# Patient Record
Sex: Female | Born: 1960 | Race: White | Hispanic: No | Marital: Married | Smoking: Never smoker
Health system: Southern US, Community
[De-identification: ages and names within clinical notes are randomized; demographics above are authoritative.]

## PROBLEM LIST (undated history)

## (undated) DIAGNOSIS — L659 Nonscarring hair loss, unspecified: Secondary | ICD-10-CM

## (undated) DIAGNOSIS — I10 Essential (primary) hypertension: Secondary | ICD-10-CM

## (undated) DIAGNOSIS — G43909 Migraine, unspecified, not intractable, without status migrainosus: Secondary | ICD-10-CM

## (undated) DIAGNOSIS — G629 Polyneuropathy, unspecified: Secondary | ICD-10-CM

## (undated) DIAGNOSIS — F329 Major depressive disorder, single episode, unspecified: Secondary | ICD-10-CM

## (undated) DIAGNOSIS — E890 Postprocedural hypothyroidism: Secondary | ICD-10-CM

## (undated) DIAGNOSIS — N83201 Unspecified ovarian cyst, right side: Secondary | ICD-10-CM

## (undated) DIAGNOSIS — F411 Generalized anxiety disorder: Secondary | ICD-10-CM

## (undated) DIAGNOSIS — G4700 Insomnia, unspecified: Secondary | ICD-10-CM

## (undated) DIAGNOSIS — F419 Anxiety disorder, unspecified: Secondary | ICD-10-CM

## (undated) DIAGNOSIS — D519 Vitamin B12 deficiency anemia, unspecified: Secondary | ICD-10-CM

## (undated) DIAGNOSIS — F32A Depression, unspecified: Secondary | ICD-10-CM

## (undated) DIAGNOSIS — H8110 Benign paroxysmal vertigo, unspecified ear: Secondary | ICD-10-CM

## (undated) HISTORY — DX: Anxiety disorder, unspecified: F41.9

## (undated) HISTORY — DX: Insomnia, unspecified: G47.00

## (undated) HISTORY — DX: Migraine, unspecified, not intractable, without status migrainosus: G43.909

## (undated) HISTORY — DX: Major depressive disorder, single episode, unspecified: F32.9

## (undated) HISTORY — DX: Postprocedural hypothyroidism: E89.0

## (undated) HISTORY — PX: EYE SURGERY: SHX253

## (undated) HISTORY — DX: Depression, unspecified: F32.A

## (undated) HISTORY — DX: Nonscarring hair loss, unspecified: L65.9

---

## 1997-11-08 ENCOUNTER — Encounter: Admission: RE | Admit: 1997-11-08 | Discharge: 1997-11-08 | Payer: Self-pay | Admitting: *Deleted

## 1999-01-23 ENCOUNTER — Encounter: Payer: Self-pay | Admitting: Physical Medicine and Rehabilitation

## 1999-01-23 ENCOUNTER — Ambulatory Visit (HOSPITAL_COMMUNITY)
Admission: RE | Admit: 1999-01-23 | Discharge: 1999-01-23 | Payer: Self-pay | Admitting: Physical Medicine and Rehabilitation

## 2000-03-09 ENCOUNTER — Encounter: Payer: Self-pay | Admitting: Emergency Medicine

## 2000-03-09 ENCOUNTER — Emergency Department (HOSPITAL_COMMUNITY): Admission: EM | Admit: 2000-03-09 | Discharge: 2000-03-09 | Payer: Self-pay | Admitting: Emergency Medicine

## 2004-10-17 ENCOUNTER — Encounter: Admission: RE | Admit: 2004-10-17 | Discharge: 2004-10-17 | Payer: Self-pay | Admitting: Family Medicine

## 2005-04-09 DIAGNOSIS — E890 Postprocedural hypothyroidism: Secondary | ICD-10-CM

## 2005-04-09 HISTORY — DX: Postprocedural hypothyroidism: E89.0

## 2005-12-26 ENCOUNTER — Ambulatory Visit (HOSPITAL_COMMUNITY): Admission: RE | Admit: 2005-12-26 | Discharge: 2005-12-26 | Payer: Self-pay | Admitting: Family Medicine

## 2006-03-06 ENCOUNTER — Encounter: Admission: RE | Admit: 2006-03-06 | Discharge: 2006-03-06 | Payer: Self-pay | Admitting: Endocrinology

## 2006-03-09 DIAGNOSIS — Z8639 Personal history of other endocrine, nutritional and metabolic disease: Secondary | ICD-10-CM

## 2006-03-09 HISTORY — DX: Personal history of other endocrine, nutritional and metabolic disease: Z86.39

## 2006-03-29 ENCOUNTER — Encounter: Admission: RE | Admit: 2006-03-29 | Discharge: 2006-03-29 | Payer: Self-pay | Admitting: Endocrinology

## 2007-09-04 ENCOUNTER — Encounter: Admission: RE | Admit: 2007-09-04 | Discharge: 2007-09-04 | Payer: Self-pay | Admitting: Family Medicine

## 2007-09-05 ENCOUNTER — Encounter: Admission: RE | Admit: 2007-09-05 | Discharge: 2007-09-05 | Payer: Self-pay | Admitting: Family Medicine

## 2008-01-08 ENCOUNTER — Encounter: Admission: RE | Admit: 2008-01-08 | Discharge: 2008-01-08 | Payer: Self-pay | Admitting: Family Medicine

## 2010-08-24 ENCOUNTER — Ambulatory Visit
Admission: RE | Admit: 2010-08-24 | Discharge: 2010-08-24 | Disposition: A | Discharge status: Home / Self Care | Payer: BC Managed Care – PPO | Source: Ambulatory Visit | Attending: Family Medicine | Admitting: Family Medicine

## 2010-08-24 ENCOUNTER — Other Ambulatory Visit: Payer: Self-pay | Admitting: Family Medicine

## 2010-08-24 DIAGNOSIS — R52 Pain, unspecified: Secondary | ICD-10-CM

## 2012-02-08 ENCOUNTER — Other Ambulatory Visit: Payer: Self-pay | Admitting: Physician Assistant

## 2012-02-08 DIAGNOSIS — Z1231 Encounter for screening mammogram for malignant neoplasm of breast: Secondary | ICD-10-CM

## 2012-02-11 ENCOUNTER — Ambulatory Visit: Payer: BC Managed Care – PPO

## 2014-11-02 ENCOUNTER — Telehealth: Payer: Self-pay | Admitting: Internal Medicine

## 2014-11-02 NOTE — Telephone Encounter (Signed)
Made in error

## 2014-12-14 ENCOUNTER — Encounter: Payer: Self-pay | Admitting: Internal Medicine

## 2014-12-14 ENCOUNTER — Ambulatory Visit (INDEPENDENT_AMBULATORY_CARE_PROVIDER_SITE_OTHER): Payer: BC Managed Care – PPO | Admitting: Internal Medicine

## 2014-12-14 VITALS — BP 112/76 | HR 74 | Temp 98.5°F | Resp 12 | Ht 63.0 in | Wt 124.0 lb

## 2014-12-14 DIAGNOSIS — E890 Postprocedural hypothyroidism: Secondary | ICD-10-CM

## 2014-12-14 LAB — T4, FREE: FREE T4: 0.93 ng/dL (ref 0.60–1.60)

## 2014-12-14 LAB — TSH: TSH: 2.12 u[IU]/mL (ref 0.35–4.50)

## 2014-12-14 MED ORDER — SYNTHROID 125 MCG PO TABS: 125.0000 ug | ORAL_TABLET | Freq: Every day | ORAL | Status: DC

## 2014-12-14 NOTE — Progress Notes (Signed)
Patient ID: Kristen [name], female   DOB: 1960/07/13, 54 y.o.   MRN: [id number]   HPI  Kristen [name] is a 54 y.o.-year-old female, self referred, for management of postablative hypothyroidism. PCP: [facility]  ObGyn: Dr Dellis Filbert.   Pt. has been dx with hypothyroidism in 2007 after RAI ablation in 03/2006; is on Levothyroxine 125 mcg, taken: - fasting - with water - separated by >90 min from b'fast  - no calcium, iron, PPIs - + multivitamins at lunchtime  I reviewed pt's thyroid tests - fluctuating: 10/21/2014: TSH 8.210 09/01/2014: TSH 1.590 07/26/2014: TSH 0.440 06/28/2014: TSH 0.110 06/09/2014: TSH 0.150 06/09/2014: TSH 0.300 (0.34-4.8) 04/26/2014: TSH 1.490 12/11/2013: TSH 0.290 (0.34-4.8)  Last OV with Dr Chalmers Cater was in 10/2014.  Pt describes: - + weight loss (50 lbs in last 6 mo)  - changed her diet but slightly - + fatigue - + tremors - up and down for years - + heat intolerance (hot flushes >> better on HRT) - no depression/+ anxiety >> panic attacks - no constipation - no dry skin - + hair loss  Pt denies feeling nodules in neck, hoarseness, + dysphagia/+ odynophagia, no SOB with lying down.  She has no FH of thyroid disorders. No FH of thyroid cancer.  No h/o radiation tx to head or neck, other than RAI tx for Graves ds. No recent use of iodine supplements.  ROS: Constitutional: See HPI Eyes: no blurry vision, no xerophthalmia ENT: + sore throat, no nodules palpated in throat, no dysphagia/odynophagia, no hoarseness Cardiovascular: no CP/SOB/palpitations/leg swelling Respiratory: no cough/SOB Gastrointestinal: no N/V/D/C Musculoskeletal: no muscle/joint aches Skin: no rashes, no hyperpigmentation Neurological: + tremors/no numbness/tingling/dizziness, + HA Psychiatric: no depression/+ anxiety  PMH: - h/o Graves' disease - Post ablative hypothyroidism - Anxiety /depression - Postmenopausal status   PMH: - no Sx hx   Social History   Social  History  . Marital Status: Married    Spouse Name: N/A  . Number of Children: 1   Occupational History  . N/a     Smoking status: Never Smoker   . Smokeless tobacco: No  . Alcohol Use: No  . Drug Use: No   Current Outpatient Rx  Name  Route  Sig  Dispense  Refill  . Aspirin-Salicylamide-Caffeine (BC HEADACHE POWDER PO)   Oral   Take by mouth as needed.         . clonazePAM (KLONOPIN) 1 MG tablet      TAKE 1 TABLET BY MOUTH EVERY DAY WITH MEAL. OCCASIONALLY EXTRA PILL AS NEEDED IF ANXIETY INCREASES.      1   . fexofenadine (ALLEGRA) 180 MG tablet   Oral   Take 90 mg by mouth 2 (two) times daily.         Marland Kitchen JINTELI 1-5 MG-MCG TABS   Oral   Take 1 tablet by mouth daily.      11     Dispense as written.   Marland Kitchen levothyroxine (SYNTHROID, LEVOTHROID) 125 MCG tablet   Oral   Take 125 mcg by mouth daily.      3   . MIMVEY LO 0.5-0.1 MG per tablet   Oral   Take 1 tablet by mouth daily.      9     Dispense as written.   . rizatriptan (MAXALT) 5 MG tablet   Oral   Take 5 mg by mouth as needed for migraine. May repeat in 2 hours if needed         .  sertraline (ZOLOFT) 100 MG tablet   Oral   Take 200 mg by mouth daily.      1    Allergies  Allergen Reactions  . Benadryl [Diphenhydramine Hcl] Other (See Comments)    Makes her skin crawl and she gets hyped up    Family history: - No thyroid family history  - No diabetes, hypertension, hyperlipidemia, cancer in family members   PE: BP 112/76 mmHg  Pulse 74  Temp(Src) 98.5 F (36.9 C) (Oral)  Resp 12  Ht 5\' 3"  (1.6 m)  Wt 124 lb (56.246 kg)  BMI 21.97 kg/m2  SpO2 98%  LMP  Wt Readings from Last 3 Encounters:  12/14/14 124 lb (56.246 kg)   Constitutional: normal weight, in NAD Eyes: PERRLA, EOMI, no exophthalmos ENT: moist mucous membranes, no thyromegaly, no cervical lymphadenopathy Cardiovascular: RRR, No MRG Respiratory: CTA B Gastrointestinal: abdomen soft, NT, ND, BS+ Musculoskeletal: no  deformities, strength intact in all 4 Skin: moist, warm, no rashes Neurological: + mild tremor with outstretched hands, DTR normal in all 4  ASSESSMENT: 1.  post ablative Hypothyroidism  PLAN:  1. Patient with long-standing hypothyroidism developed after RAI ablation for Graves' disease, on levothyroxine therapy.has tremors, anxiety, weight loss, dysphagia. Her most recent thyroid tests shows an elevated TSH, at 8. Her levothyroxine dose has been changed several times in the last few months. She does not appear to have a goiter, thyroid nodules, or neck compression symptoms - We discussed about correct intake of levothyroxine, fasting, with water, separated by at least 30 minutes from breakfast, and separated by more than 4 hours from calcium, iron, multivitamins, acid reflux medications (PPIs). she is taking it correctly.  - I suggested that she switches to brand name Synthroid for more consistent dosing. I gave her a discount card for this medication.  - will check thyroid tests today: TSH, free T4 - If these are abnormal, she will need to return in 6-8 weeks for repeat labs - If these are normal, I will see her back in 3 months  Needs Synthroid DAW  Office Visit on 12/14/2014  Component Date Value Ref Range Status  . TSH 12/14/2014 2.12  0.35 - 4.50 uIU/mL Final  . Free T4 12/14/2014 0.93  0.60 - 1.60 ng/dL Final   Thyroid tests are at goal. We'll go ahead and switch to Synthroid d.a.w. and repeat her thyroid tests when she comes back in 3 months.

## 2014-12-14 NOTE — Patient Instructions (Signed)
Please stop at the lab.  Please try to join MyChart for easier communication.  Take the thyroid hormone every day, with water, >30 minutes before breakfast, separated by >4 hours from acid reflux medications, calcium, iron, multivitamins.

## 2015-02-09 ENCOUNTER — Encounter: Payer: Self-pay | Admitting: *Deleted

## 2015-02-11 ENCOUNTER — Ambulatory Visit (INDEPENDENT_AMBULATORY_CARE_PROVIDER_SITE_OTHER): Payer: BC Managed Care – PPO | Admitting: Diagnostic Neuroimaging

## 2015-02-11 ENCOUNTER — Encounter: Payer: Self-pay | Admitting: Diagnostic Neuroimaging

## 2015-02-11 VITALS — BP 92/58 | HR 88 | Resp 20 | Ht 63.0 in | Wt 124.0 lb

## 2015-02-11 DIAGNOSIS — G250 Essential tremor: Secondary | ICD-10-CM | POA: Diagnosis not present

## 2015-02-11 DIAGNOSIS — G252 Other specified forms of tremor: Secondary | ICD-10-CM | POA: Insufficient documentation

## 2015-02-11 DIAGNOSIS — R251 Tremor, unspecified: Secondary | ICD-10-CM | POA: Insufficient documentation

## 2015-02-11 NOTE — Progress Notes (Signed)
[facility]  PATIENT: Kristen [name] DOB: 1960-05-15  REFERRING CLINICIAN: Matilde Haymaker HISTORY FROM: patient and husband  REASON FOR VISIT: new consult   HISTORICAL  CHIEF COMPLAINT:  Chief Complaint  Patient presents with  . New Patient (Initial Visit)    shaking in hands and arms, going for 4-5 months, rm 6, with husband keith    HISTORY OF PRESENT ILLNESS:   54 year old right-handed female here for evaluation of tremor. For past 5 months patient has had intermittent arm, body, leg tremor and shaking. This changes of fluctuates throughout the day. Symptoms seem to aggravate more in the late evening before she takes her bedtime clonazepam. When she takes this medication she feels calm and relaxed tremor stop. In the morning she also feels well. Sometimes when she is getting ready to leave her home she feels more tremor. After she leaves her home and is doing activities, her tremor calms down.  3 years ago patient went through significant traumatic event, found her son-in-law had committed suicide in their back yard (hung himself). Patient then developed significant anxiety, depression, PTSD symptoms. She's been getting psychiatry and psychology treatments since that time. Patient has been on sertraline since that time. 4 months ago her sertraline was increased from 100-200 mg daily.  Patient also has history of Graves' disease, status post thyroid ablation and post operative hypothyroidism. Apparently her thyroid function testing recently has been normal.   REVIEW OF SYSTEMS: Full 14 system review of systems performed and notable only for confusion headache weakness sleepiness restless legs tremor depression anxiety decreased energy disinterest activity suicidal thoughts allergy skin sensitivity feeling hot feeling cold 50 pound weight loss over 3 months, occurred 5 months ago, ringing in ears.   ALLERGIES: Allergies  Allergen Reactions  . Benadryl [Diphenhydramine  Hcl] Other (See Comments)    Makes her skin crawl and she gets hyped up     HOME MEDICATIONS: Outpatient Prescriptions Prior to Visit  Medication Sig Dispense Refill  . Aspirin-Salicylamide-Caffeine (BC HEADACHE POWDER PO) Take by mouth as needed.    . clonazePAM (KLONOPIN) 1 MG tablet TAKE 1 TABLET BY MOUTH EVERY DAY WITH MEAL. OCCASIONALLY EXTRA PILL AS NEEDED IF ANXIETY INCREASES.  1  . fexofenadine (ALLEGRA) 180 MG tablet Take 90 mg by mouth 2 (two) times daily.    . rizatriptan (MAXALT) 5 MG tablet Take 5 mg by mouth as needed for migraine. May repeat in 2 hours if needed    . sertraline (ZOLOFT) 100 MG tablet Take 200 mg by mouth daily.  1  . SYNTHROID 125 MCG tablet Take 1 tablet (125 mcg total) by mouth daily before breakfast. 90 tablet 1  . JINTELI 1-5 MG-MCG TABS Take 1 tablet by mouth daily.  11  . MIMVEY LO 0.5-0.1 MG per tablet Take 1 tablet by mouth daily.  9   No facility-administered medications prior to visit.    PAST MEDICAL HISTORY: Past Medical History  Diagnosis Date  . Migraine   . Insomnia   . Post-surgical hypothyroidism     ?cancer  . Hair loss   . Depression   . Anxiety     PAST SURGICAL HISTORY: No past surgical history on file.  FAMILY HISTORY: Family History  Problem Relation Age of Onset  . Hypertension Father   . Heart disease Father   . Healthy Mother     SOCIAL HISTORY:  Social History   Social History  . Marital Status: Married    Spouse Name:  N/A  . Number of Children: 1  . Years of Education: N/A   Occupational History  .      unemployed   Social History Main Topics  . Smoking status: Never Smoker   . Smokeless tobacco: Not on file  . Alcohol Use: 0.0 oz/week    0 Standard drinks or equivalent per week     Comment: rarely  . Drug Use: No  . Sexual Activity: Not on file   Other Topics Concern  . Not on file   Social History Narrative   Lives with husband.   Drinks only water. Denies caffeine use.     PHYSICAL  EXAM  GENERAL EXAM/CONSTITUTIONAL: Vitals:  Filed Vitals:   02/11/15 1011  BP: 92/58  Pulse: 88  Resp: 20  Height: 5\' 3"  (1.6 m)  Weight: 124 lb (56.246 kg)     Body mass index is 21.97 kg/(m^2).  No exam data present  Patient is in no distress; well developed, nourished and groomed; neck is supple  CARDIOVASCULAR:  Examination of carotid arteries is normal; no carotid bruits  Regular rate and rhythm, no murmurs  Examination of peripheral vascular system by observation and palpation is normal  EYES:  Ophthalmoscopic exam of optic discs and posterior segments is normal; no papilledema or hemorrhages  MUSCULOSKELETAL:  Gait, strength, tone, movements noted in Neurologic exam below  NEUROLOGIC: MENTAL STATUS:  No flowsheet data found.  awake, alert, oriented to person, place and time  recent and remote memory intact  normal attention and concentration  language fluent, comprehension intact, naming intact,   fund of knowledge appropriate  CRANIAL NERVE:   2nd - no papilledema on fundoscopic exam  2nd, 3rd, 4th, 6th - pupils equal and reactive to light, visual fields full to confrontation, extraocular muscles intact, no nystagmus  5th - facial sensation symmetric  7th - facial strength symmetric  8th - hearing intact  9th - palate elevates symmetrically, uvula midline  11th - shoulder shrug symmetric  12th - tongue protrusion midline  MOTOR:   normal bulk and tone, full strength in the BUE, BLE  SENSORY:   normal and symmetric to light touch, temperature, vibration  COORDINATION:   finger-nose-finger, fine finger movements normal  REFLEXES:   deep tendon reflexes present and symmetric  GAIT/STATION:   narrow based gait; TANDEM STABLE; romberg is negative    DIAGNOSTIC DATA (LABS, IMAGING, TESTING) - I reviewed patient records, labs, notes, testing and imaging myself where available.  No results found for: WBC, HGB, HCT, MCV,  PLT No results found for: NA, K, CL, CO2, GLUCOSE, BUN, CREATININE, CALCIUM, PROT, ALBUMIN, AST, ALT, ALKPHOS, BILITOT, GFRNONAA, GFRAA No results found for: CHOL, HDL, LDLCALC, LDLDIRECT, TRIG, CHOLHDL No results found for: HGBA1C No results found for: VITAMINB12 Lab Results  Component Value Date   TSH 2.12 12/14/2014       ASSESSMENT AND PLAN  54 y.o. year old female here with intermittent postural tremor, likely enhanced physiologic tremor and anxiety associated tremor. Neurologic exam is normal today. Tremor most likely related to underlying anxiety and PTSD. Hormone fluctuations could also play a role. I do not recommend any further neurodiagnostic testing at this time. Advised her to continue working with her doctors on anxiety/PTSD treatment, continue exercise and healthy eating habits.   Dx: Excessive physiologic tremor   PLAN: - continue current medications and exercise  Return if symptoms worsen or fail to improve, for return to PCP.    Penni Bombard, MD  26/06/3352, 56:25 AM Certified in Neurology, Neurophysiology and [facility] [address] [place], [place] [address] [phone]

## 2015-02-11 NOTE — Patient Instructions (Signed)
Thank you for coming to see [place] at [facility]. I hope we have been able to provide you high quality care today.  You may receive a patient satisfaction survey over the next few weeks. We would appreciate your feedback and comments so that we may continue to improve ourselves and the health of our patients.   - continue current medications and exercise   ~~~~~~~~~~~~~~~~~~~~~~~~~~~~~~~~~~~~~~~~~~~~~~~~~~~~~~~~~~~~~~~~~  DR. PENUMALLI'S GUIDE TO HAPPY AND HEALTHY LIVING These are some of my general health and wellness recommendations. Some of them may apply to you better than others. Please use common sense as you try these suggestions and feel free to ask me any questions.   ACTIVITY/FITNESS Mental, social, emotional and physical stimulation are very important for brain and body health. Try learning a new activity (arts, music, language, sports, games).  Keep moving your body to the best of your abilities. You can do this at home, inside or outside, the park, community center, gym or anywhere you like. Consider a physical therapist or personal trainer to get started. Consider the app Sworkit. Fitness trackers such as smart-watches, smart-phones or Fitbits can help as well.   NUTRITION Eat more plants: colorful vegetables, nuts, seeds and berries.  Eat less sugar, salt, preservatives and processed foods.  Avoid toxins such as cigarettes and alcohol.  Drink water when you are thirsty. Warm water with a slice of lemon is an excellent morning drink to start the day.  Consider these websites for more information The Nutrition Source (https://www.henry-hernandez.biz/) Precision Nutrition (WindowBlog.ch)   RELAXATION Consider practicing mindfulness meditation or other relaxation techniques such as deep breathing, prayer, yoga, tai chi, massage. See website mindful.org or the apps Headspace or Calm to help get  started.   SLEEP Try to get at least 7-8+ hours sleep per day. Regular exercise and reduced caffeine will help you sleep better. Practice good sleep hygeine techniques. See website sleep.org for more information.   PLANNING Prepare estate planning, living will, healthcare POA documents. Sometimes this is best planned with the help of an attorney. Theconversationproject.org and agingwithdignity.org are excellent resources.

## 2015-03-18 ENCOUNTER — Ambulatory Visit (INDEPENDENT_AMBULATORY_CARE_PROVIDER_SITE_OTHER): Payer: BC Managed Care – PPO | Admitting: Internal Medicine

## 2015-03-18 ENCOUNTER — Encounter: Payer: Self-pay | Admitting: Internal Medicine

## 2015-03-18 VITALS — BP 108/60 | HR 78 | Temp 98.1°F | Resp 12 | Wt 123.6 lb

## 2015-03-18 DIAGNOSIS — E890 Postprocedural hypothyroidism: Secondary | ICD-10-CM | POA: Diagnosis not present

## 2015-03-18 LAB — TSH: TSH: 0.83 u[IU]/mL (ref 0.35–4.50)

## 2015-03-18 LAB — T4, FREE: FREE T4: 1.05 ng/dL (ref 0.60–1.60)

## 2015-03-18 NOTE — Patient Instructions (Signed)
Please stop at the lab.  Continue Synthroid 125 mcg daily.  Take the thyroid hormone every day, with water, >30 minutes before breakfast, separated by >4 hours from acid reflux medications, calcium, iron, multivitamins.

## 2015-03-18 NOTE — Progress Notes (Signed)
Patient ID: Kristen [name], female   DOB: Mar 29, 1961, 54 y.o.   MRN: [id number]   HPI  Kristen [name] is a 54 y.o.-year-old female, returning for f/u for postablative hypothyroidism. Last visit 3 mo ago. PCP: [facility]  ObGyn: Dr Dellis Filbert.   Pt. has been dx with hypothyroidism in 2007 after RAI ablation in 03/2006; is on Synthroid DAW 125 mcg, taken: - fasting - with water - separated by >90 min from b'fast  - no calcium, iron, PPIs - + multivitamins at lunchtime  I reviewed pt's thyroid tests; 01/2015: TSH normal Lab Results  Component Value Date   TSH 2.12 12/14/2014   FREET4 0.93 12/14/2014  10/21/2014: TSH 8.210 09/01/2014: TSH 1.590 07/26/2014: TSH 0.440 06/28/2014: TSH 0.110 06/09/2014: TSH 0.150 06/09/2014: TSH 0.300 (0.34-4.8) 04/26/2014: TSH 1.490 12/11/2013: TSH 0.290 (0.34-4.8)  Pt tells me she feels much better on the DAW Synthroid. She describes: - no weight loss and gain - improved tremors - no fatigue - no heat intolerance  - no depression/+ anxiety >> panic attacks (PTSD after she found her son in law dead in her yard - hanged himself) - no constipation - no dry skin - less hair loss  Pt denies feeling nodules in neck, hoarseness, no dysphagia/odynophagia, no SOB with lying down.  She has no FH of thyroid disorders. No FH of thyroid cancer.  No h/o radiation tx to head or neck, other than RAI tx for Graves ds. No recent use of iodine supplements.  ROS: Constitutional: See HPI Eyes: no blurry vision, no xerophthalmia ENT: no sore throat, no nodules palpated in throat, no dysphagia/odynophagia, no hoarseness Cardiovascular: no CP/SOB/palpitations/leg swelling Respiratory: no cough/SOB Gastrointestinal: no N/V/D/C Musculoskeletal: no muscle/joint aches Skin: no rashes, no hyperpigmentation Neurological: improved tremors/no numbness/tingling/dizziness, + HA Psychiatric: no depression/+ anxiety  PMH: - h/o Graves' disease - Post ablative  hypothyroidism - Anxiety /depression - Postmenopausal status   PMH: - no Sx hx   Social History   Social History  . Marital Status: Married    Spouse Name: N/A  . Number of Children: 1   Occupational History  . N/a     Smoking status: Never Smoker   . Smokeless tobacco: No  . Alcohol Use: No  . Drug Use: No   Current Outpatient Rx  Name  Route  Sig  Dispense  Refill  . Aspirin-Salicylamide-Caffeine (BC HEADACHE POWDER PO)   Oral   Take by mouth as needed.         . clonazePAM (KLONOPIN) 1 MG tablet      TAKE 1 TABLET BY MOUTH EVERY DAY WITH MEAL. OCCASIONALLY EXTRA PILL AS NEEDED IF ANXIETY INCREASES.      1   . fexofenadine (ALLEGRA) 180 MG tablet   Oral   Take 90 mg by mouth 2 (two) times daily.         . rizatriptan (MAXALT) 5 MG tablet   Oral   Take 5 mg by mouth as needed for migraine. May repeat in 2 hours if needed         . sertraline (ZOLOFT) 100 MG tablet   Oral   Take 200 mg by mouth daily.      1   . SYNTHROID 125 MCG tablet   Oral   Take 1 tablet (125 mcg total) by mouth daily before breakfast.   90 tablet   1     Dispense as written.    Allergies  Allergen Reactions  . Benadryl [Diphenhydramine Hcl] Other (See  Comments)    Makes her skin crawl and she gets hyped up    Family history: - No thyroid family history  - No diabetes, hypertension, hyperlipidemia, cancer in family members   PE: BP 108/60 mmHg  Pulse 78  Temp(Src) 98.1 F (36.7 C) (Oral)  Resp 12  Wt 123 lb 9.6 oz (56.065 kg)  SpO2 98% Body mass index is 21.9 kg/(m^2). Wt Readings from Last 3 Encounters:  03/18/15 123 lb 9.6 oz (56.065 kg)  02/11/15 124 lb (56.246 kg)  12/14/14 124 lb (56.246 kg)   Constitutional: normal weight, in NAD Eyes: PERRLA, EOMI, no exophthalmos ENT: moist mucous membranes, no thyromegaly, no cervical lymphadenopathy Cardiovascular: RRR, No MRG Respiratory: CTA B Gastrointestinal: abdomen soft, NT, ND, BS+ Musculoskeletal: no  deformities, strength intact in all 4 Skin: moist, warm, no rashes Neurological: + mild tremor with outstretched hands, DTR normal in all 4  ASSESSMENT: 1.  post ablative Hypothyroidism  PLAN:  1. Patient with long-standing hypothyroidism developed after RAI ablation for Graves' disease, with uncontrolled TFTs over the years, now much better on Synthroid DAW. Her sxs have all improved. She does not appear to have a goiter, thyroid nodules, or neck compression symptoms. - We discussed about correct intake of LT4, fasting, with water, separated by at least 30 minutes from breakfast, and separated by more than 4 hours from calcium, iron, multivitamins, acid reflux medications (PPIs). She is taking it correctly.  - will check thyroid tests today: TSH, free T4 - If these are abnormal, she will need to return in 6 weeks for repeat labs - If these are normal, I will see her back in 1 year  Office Visit on 03/18/2015  Component Date Value Ref Range Status  . Free T4 03/18/2015 1.05  0.60 - 1.60 ng/dL Final  . TSH 03/18/2015 0.83  0.35 - 4.50 uIU/mL Final   Excellent labs!

## 2015-03-21 ENCOUNTER — Telehealth: Payer: Self-pay | Admitting: Internal Medicine

## 2015-03-21 NOTE — Telephone Encounter (Signed)
Patient calling for lab resuts

## 2015-03-21 NOTE — Telephone Encounter (Signed)
Called pt and lvm advising her per Dr Arman Filter result note sent to pt via [place]. Advised pt to continue on her same dose of Synthroid and call with any questions.

## 2015-06-05 ENCOUNTER — Other Ambulatory Visit: Payer: Self-pay | Admitting: Internal Medicine

## 2015-12-18 ENCOUNTER — Other Ambulatory Visit: Payer: Self-pay | Admitting: Internal Medicine

## 2016-03-16 ENCOUNTER — Ambulatory Visit: Payer: BC Managed Care – PPO | Admitting: Internal Medicine

## 2016-03-16 DIAGNOSIS — Z0289 Encounter for other administrative examinations: Secondary | ICD-10-CM

## 2016-04-23 ENCOUNTER — Telehealth: Payer: Self-pay | Admitting: Internal Medicine

## 2016-04-23 NOTE — Telephone Encounter (Signed)
OK, TSH and fT4

## 2016-04-23 NOTE — Telephone Encounter (Signed)
Pt called in and was wondering if she could possibly come in for some lab work earlier than her appointment, she said she hasn't been feeling well.

## 2016-04-24 ENCOUNTER — Other Ambulatory Visit: Payer: Self-pay

## 2016-04-24 ENCOUNTER — Telehealth: Payer: Self-pay

## 2016-04-24 DIAGNOSIS — E039 Hypothyroidism, unspecified: Secondary | ICD-10-CM

## 2016-04-24 DIAGNOSIS — R635 Abnormal weight gain: Secondary | ICD-10-CM

## 2016-04-24 NOTE — Telephone Encounter (Signed)
Added to lab work

## 2016-04-24 NOTE — Telephone Encounter (Signed)
Called patient and advised to come in for labs. Made lab appointment. Patient states she is gaining weight and is craving sugars, is there anything we need to check or just check TSH and free t4. Please advise thank you!

## 2016-04-24 NOTE — Telephone Encounter (Signed)
Let's also add a hemoglobin A1c to screen for diabetes. Please associated with :"weight gain".

## 2016-04-30 ENCOUNTER — Other Ambulatory Visit (INDEPENDENT_AMBULATORY_CARE_PROVIDER_SITE_OTHER): Payer: BC Managed Care – PPO

## 2016-04-30 DIAGNOSIS — R635 Abnormal weight gain: Secondary | ICD-10-CM

## 2016-04-30 DIAGNOSIS — E039 Hypothyroidism, unspecified: Secondary | ICD-10-CM

## 2016-04-30 LAB — HEMOGLOBIN A1C
HEMOGLOBIN A1C: 5.1 % (ref <5.7)
MEAN PLASMA GLUCOSE: 100 mg/dL

## 2016-04-30 LAB — TSH: TSH: 6.32 u[IU]/mL — AB (ref 0.35–4.50)

## 2016-04-30 LAB — T4, FREE: FREE T4: 1 ng/dL (ref 0.60–1.60)

## 2016-05-01 ENCOUNTER — Telehealth: Payer: Self-pay

## 2016-05-01 NOTE — Telephone Encounter (Signed)
Called patient and advised that Dr.Gherghe had sent mychart messages regarding lab results, gave call back number in case she had questions.

## 2016-05-01 NOTE — Telephone Encounter (Signed)
Patient calling for her lab results

## 2016-05-01 NOTE — Telephone Encounter (Signed)
Patient sent mychart messages regarding lab results.

## 2016-05-01 NOTE — Telephone Encounter (Signed)
Patient called and had questions about her lab results. She stated that she has not changed the way she takes the medication and has not skipped any doses, so what does she need to do now. Also patient asks if there is any type of diet to start to help with thyroid issues. Please advise. Thank you!

## 2016-05-02 ENCOUNTER — Other Ambulatory Visit: Payer: Self-pay

## 2016-05-02 MED ORDER — SYNTHROID 137 MCG PO TABS: 137.0000 ug | ORAL_TABLET | Freq: Every day | ORAL | 0 refills | Status: DC

## 2016-05-02 NOTE — Telephone Encounter (Signed)
Submitted, and notified patient via mychart message of diet advice.

## 2016-05-02 NOTE — Telephone Encounter (Signed)
OK, let's send a 137 mcg dose of Synthroid DAW. Regarding diet: a plant-based, low fat, organic diet is probably best.

## 2016-05-02 NOTE — Telephone Encounter (Signed)
Pt is asking for the thyroid med to be called in today to [facility] on [address]

## 2016-05-03 NOTE — Telephone Encounter (Signed)
Called and advised patient of message. No questions at this time.

## 2016-05-03 NOTE — Telephone Encounter (Signed)
Patient husband ask you to call him with this information, patient do not know how to access my chart. Please advise.

## 2016-05-15 ENCOUNTER — Ambulatory Visit: Payer: BC Managed Care – PPO | Admitting: Internal Medicine

## 2016-05-29 ENCOUNTER — Telehealth: Payer: Self-pay

## 2016-05-29 ENCOUNTER — Telehealth: Payer: Self-pay | Admitting: Internal Medicine

## 2016-05-29 DIAGNOSIS — E890 Postprocedural hypothyroidism: Secondary | ICD-10-CM

## 2016-05-29 NOTE — Telephone Encounter (Signed)
Called patient, advised that she can come in for labs, and once we get those results she may need to see PCP. Patient states she is feeling shaky, weak, and has no energy. I got patient scheduled for labs for tomorrow to check TSH and Free t4 which I ordered. Thank you!

## 2016-05-29 NOTE — Telephone Encounter (Signed)
She can come back to repeat her TSH and free T4. I am not sure what she means by not feeling good, she may need to see her PCP, but I would wait for the TFT is to come back first.

## 2016-05-29 NOTE — Telephone Encounter (Signed)
Called and scheduled patient for labs, no other questions at this time. Ordered labs.

## 2016-05-29 NOTE — Telephone Encounter (Signed)
Pt called in and said that she is still not feeling well, she wants to know if she should come back in for some more blood work.

## 2016-05-30 ENCOUNTER — Other Ambulatory Visit (INDEPENDENT_AMBULATORY_CARE_PROVIDER_SITE_OTHER): Payer: BC Managed Care – PPO

## 2016-05-30 ENCOUNTER — Other Ambulatory Visit: Payer: Self-pay

## 2016-05-30 DIAGNOSIS — E890 Postprocedural hypothyroidism: Secondary | ICD-10-CM | POA: Diagnosis not present

## 2016-05-30 LAB — TSH: TSH: 0.23 u[IU]/mL — AB (ref 0.35–4.50)

## 2016-05-30 LAB — T4, FREE: FREE T4: 1.38 ng/dL (ref 0.60–1.60)

## 2016-05-30 MED ORDER — LEVOTHYROXINE SODIUM 125 MCG PO TABS: ORAL_TABLET | ORAL | 0 refills | Status: DC

## 2016-05-30 NOTE — Telephone Encounter (Signed)
Called patient regarding lab results, patient understood, submitted new rx to pharmacy. Patient had no other questions at this time.

## 2016-06-11 ENCOUNTER — Telehealth: Payer: Self-pay | Admitting: Internal Medicine

## 2016-06-11 ENCOUNTER — Telehealth: Payer: Self-pay

## 2016-06-11 NOTE — Telephone Encounter (Signed)
Called patient and advised of Dr.Gherghe's note, patient understood and had no questions at this time.

## 2016-06-11 NOTE — Telephone Encounter (Signed)
We need to wait ~5 weeks from the previous check and then we can repeat the sae tests if she prefers.

## 2016-06-11 NOTE — Telephone Encounter (Signed)
Called patient and advised of Dr.Gherghe's note. Patient understood.

## 2016-06-11 NOTE — Telephone Encounter (Signed)
Pt is asking for another lab test to be sure the dosing is ok for her medicine, she is experiencing some symptoms still

## 2016-06-20 ENCOUNTER — Telehealth: Payer: Self-pay

## 2016-06-20 ENCOUNTER — Telehealth: Payer: Self-pay | Admitting: Internal Medicine

## 2016-06-20 NOTE — Telephone Encounter (Signed)
Called and LVM for patient to call back regarding the thyroid issues. Gave call back number.

## 2016-06-20 NOTE — Telephone Encounter (Signed)
Pt called in and wanted to speak with you about her thyroid. Did not give any other information.

## 2016-07-02 ENCOUNTER — Encounter: Payer: Self-pay | Admitting: Internal Medicine

## 2016-07-02 ENCOUNTER — Ambulatory Visit (INDEPENDENT_AMBULATORY_CARE_PROVIDER_SITE_OTHER): Payer: BC Managed Care – PPO | Admitting: Internal Medicine

## 2016-07-02 VITALS — BP 124/82 | HR 77 | Wt 145.0 lb

## 2016-07-02 DIAGNOSIS — E890 Postprocedural hypothyroidism: Secondary | ICD-10-CM

## 2016-07-02 LAB — T4, FREE: FREE T4: 1.1 ng/dL (ref 0.60–1.60)

## 2016-07-02 LAB — TSH: TSH: 0.41 u[IU]/mL (ref 0.35–4.50)

## 2016-07-02 MED ORDER — LEVOTHYROXINE SODIUM 112 MCG PO TABS: 112.0000 ug | ORAL_TABLET | Freq: Every day | ORAL | 1 refills | Status: DC

## 2016-07-02 NOTE — Patient Instructions (Addendum)
Please stop at the lab.  Continue Synthroid 125 mcg daily.  Take the thyroid hormone every day, with water, at least 30 minutes before breakfast, separated by at least 4 hours from: - acid reflux medications - calcium - iron - multivitamins  Please return in 1 year.

## 2016-07-02 NOTE — Progress Notes (Signed)
Patient ID: Kristen [name], female   DOB: 01-29-1961, 56 y.o.   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 Last visit 1 year and 3 mo ago. PCP: [facility]  [facility]Gyn: Dr Dellis Filbert.   She is on Synthroid DAW 125 >> 137 >> 125 mcg (decreased back in 05/2016), taken: - fasting - with water - separated by >30 min from b'fast (protein bar or yoghurt) - no calcium, iron, PPIs - + multivitamins at lunchtime  I reviewed pt's thyroid tests; Lab Results  Component Value Date   TSH 0.23 (L) 05/30/2016   TSH 6.32 (H) 04/30/2016   TSH 0.83 03/18/2015   TSH 2.12 12/14/2014   FREET4 1.38 05/30/2016   FREET4 1.00 04/30/2016   FREET4 1.05 03/18/2015   FREET4 0.93 12/14/2014  10/21/2014: TSH 8.210 09/01/2014: TSH 1.590 07/26/2014: TSH 0.440 06/28/2014: TSH 0.110 06/09/2014: TSH 0.150 06/09/2014: TSH 0.300 (0.34-4.8) 04/26/2014: TSH 1.490 12/11/2013: TSH 0.290 (0.34-4.8)  Pt describes: - + Weight gain: 22 lbs since last visit - + internal tremors - + lack of sleep 2/2 tremors - no palpitations - no fatigue - no heat intolerance  - this is better now - no depression/+ anxiety >> panic attacks (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  She has no FH of thyroid disorders. No FH of thyroid cancer.  No h/o radiation tx to head or neck, other than RAI tx for Graves ds. No recent use of iodine supplements.  ROS: Constitutional: See HPI Eyes: no blurry vision, no xerophthalmia ENT: no sore throat, no nodules palpated in throat, no dysphagia/odynophagia, no hoarseness Cardiovascular: no CP/SOB/palpitations/leg swelling Respiratory: no cough/SOB Gastrointestinal: no N/V/D/C Musculoskeletal: no  muscle/joint aches Skin: no rashes Neurological: + tremors/no numbness/tingling/dizziness  I reviewed pt's medications, allergies, PMH, social hx, family hx, and changes were documented in the history of present illness. Otherwise, unchanged from my initial visit note.  PMH: - h/o Graves' disease - Post ablative hypothyroidism - Anxiety /depression - Postmenopausal status   PMH: - no Sx hx   Social History   Social History  . Marital Status: Married    Spouse Name: N/A  . Number of Children: 1   Occupational History  . N/a     Smoking status: Never Smoker   . Smokeless tobacco: No  . Alcohol Use: No  . Drug Use: No   Current Outpatient Prescriptions  Medication Sig Dispense Refill  . Aspirin-Salicylamide-Caffeine (BC HEADACHE POWDER PO) Take by mouth as needed.    . fexofenadine (ALLEGRA) 180 MG tablet Take 90 mg by mouth 2 (two) times daily.    Marland Kitchen levothyroxine (SYNTHROID) 125 MCG tablet Take tablet daily. 90 tablet 0  . sertraline (ZOLOFT) 100 MG tablet Take 200 mg by mouth daily.  1  . clonazePAM (KLONOPIN) 1 MG tablet TAKE 1 TABLET BY MOUTH EVERY DAY WITH MEAL. OCCASIONALLY EXTRA PILL AS NEEDED IF ANXIETY INCREASES.  1  . rizatriptan (MAXALT) 5 MG tablet Take 5 mg by mouth as needed for migraine. May repeat in 2 hours if needed     No current facility-administered medications for this visit.    Allergies  Allergen Reactions  . Benadryl [Diphenhydramine Hcl] Other (See Comments)  Makes her skin crawl and she gets hyped up    Family history: - No thyroid family history  - No diabetes, hypertension, hyperlipidemia, cancer in family members   PE: BP 124/82 (BP Location: Left Arm, Patient Position: Sitting)   Pulse 77   Wt 145 lb (65.8 kg)   SpO2 98%   BMI 25.69 kg/m  Body mass index is 25.69 kg/m. Wt Readings from Last 3 Encounters:  07/02/16 145 lb (65.8 kg)  03/18/15 123 lb 9.6 oz (56.1 kg)  02/11/15 124 lb (56.2 kg)   Constitutional: normal weight,  in NAD Eyes: PERRLA, EOMI, no exophthalmos ENT: moist mucous membranes, no thyromegaly, no cervical lymphadenopathy Cardiovascular: RRR, No MRG Respiratory: CTA B Gastrointestinal: abdomen soft, NT, ND, BS+ Musculoskeletal: no deformities, strength intact in all 4 Skin: moist, warm, no rashes Neurological: + mild tremor with outstretched hands, DTR normal in all 4  ASSESSMENT: 1.  Post ablative Hypothyroidism  PLAN:  1. Patient with long-standing hypothyroidism developed after RAI ablation for Graves' disease, with uncontrolled TFTs over the years, But improved on Synthroid DAW. Her sxs have also improved, but she tells me that she now feels an internal tremor, which is not getting better. We decreased her Synthroid dose 5 weeks ago after a TSH returned slightly suppressed. We may need to decrease the dose even further. Will try to target the mid normal range to improve her tremors and allow her to sleep better. - She does not appear to have a goiter, thyroid nodules, or neck compression symptoms. - We discussed about correct intake of LT4, fasting, with water, separated by at least 30 minutes from breakfast, and separated by more than 4 hours from calcium, iron, multivitamins, acid reflux medications (PPIs). She is taking it correctly.  - will check thyroid tests today: TSH, free T4 - If these are abnormal, she will need to return in 6 weeks for repeat labs - If these are normal, I will see her back in 1 year  Need to call with results.  Component     Latest Ref Rng & Units 07/02/2016  TSH     0.35 - 4.50 uIU/mL 0.41  T4,Free(Direct)     0.60 - 1.60 ng/dL 1.10   TFTs normalized, but TSH still lower >> will try a lower dose >> 112 mcg daily and repeat TFTs in 6 weeks.  Philemon Kingdom, MD PhD [facility]

## 2016-07-03 ENCOUNTER — Telehealth: Payer: Self-pay

## 2016-07-03 NOTE — Telephone Encounter (Signed)
LVM, gave lab results. Gave call back number if any questions or concerns.  

## 2016-07-03 NOTE — Telephone Encounter (Signed)
-----   Message from Philemon Kingdom, MD sent at 07/02/2016  5:54 PM EDT ----- Kristen [name], can you please call pt:  TFTs normalized, but TSH still low >> will try a lower dose >> I sent a 112 mcg dose and repeat TFTs in 6 weeks. Labs are in.

## 2016-08-20 ENCOUNTER — Other Ambulatory Visit (INDEPENDENT_AMBULATORY_CARE_PROVIDER_SITE_OTHER): Payer: BC Managed Care – PPO

## 2016-08-20 DIAGNOSIS — E890 Postprocedural hypothyroidism: Secondary | ICD-10-CM | POA: Diagnosis not present

## 2016-08-20 LAB — T4, FREE: Free T4: 0.97 ng/dL (ref 0.60–1.60)

## 2016-08-20 LAB — TSH: TSH: 0.46 u[IU]/mL (ref 0.35–4.50)

## 2016-08-27 ENCOUNTER — Other Ambulatory Visit: Payer: BC Managed Care – PPO

## 2016-10-01 ENCOUNTER — Other Ambulatory Visit: Payer: Self-pay | Admitting: Internal Medicine

## 2017-01-04 ENCOUNTER — Other Ambulatory Visit: Payer: Self-pay | Admitting: Internal Medicine

## 2017-04-09 ENCOUNTER — Other Ambulatory Visit: Payer: Self-pay | Admitting: Internal Medicine

## 2017-04-10 ENCOUNTER — Telehealth: Payer: Self-pay | Admitting: Internal Medicine

## 2017-04-10 MED ORDER — LEVOTHYROXINE SODIUM 112 MCG PO TABS: ORAL_TABLET | ORAL | 1 refills | Status: DC

## 2017-04-10 NOTE — Telephone Encounter (Signed)
°  Pt needs refill send over to pharmacy   SYNTHROID 112 MCG tablet

## 2017-04-10 NOTE — Telephone Encounter (Signed)
Sent to pharmacy 

## 2017-06-05 ENCOUNTER — Encounter (HOSPITAL_COMMUNITY): Payer: Self-pay | Admitting: *Deleted

## 2017-06-05 ENCOUNTER — Emergency Department (HOSPITAL_COMMUNITY)
Admission: EM | Admit: 2017-06-05 | Discharge: 2017-06-05 | Disposition: A | Discharge status: Home / Self Care | Payer: BC Managed Care – PPO | Attending: Emergency Medicine | Admitting: Emergency Medicine

## 2017-06-05 DIAGNOSIS — Z79899 Other long term (current) drug therapy: Secondary | ICD-10-CM | POA: Insufficient documentation

## 2017-06-05 DIAGNOSIS — M5412 Radiculopathy, cervical region: Secondary | ICD-10-CM | POA: Diagnosis not present

## 2017-06-05 DIAGNOSIS — M542 Cervicalgia: Secondary | ICD-10-CM | POA: Diagnosis present

## 2017-06-05 MED ORDER — TRAMADOL HCL 50 MG PO TABS: 50.0000 mg | ORAL_TABLET | Freq: Four times a day (QID) | ORAL | 0 refills | Status: DC | PRN

## 2017-06-05 MED ORDER — METHOCARBAMOL 500 MG PO TABS: 500.0000 mg | ORAL_TABLET | Freq: Two times a day (BID) | ORAL | 0 refills | Status: DC

## 2017-06-05 MED ORDER — KETOROLAC TROMETHAMINE 15 MG/ML IJ SOLN
30.0000 mg | Freq: Once | INTRAMUSCULAR | Status: AC
  Administered 2017-06-05: 30 mg via INTRAMUSCULAR
  Filled 2017-06-05: qty 2

## 2017-06-05 MED ORDER — DEXAMETHASONE SODIUM PHOSPHATE 4 MG/ML IJ SOLN
10.0000 mg | Freq: Once | INTRAMUSCULAR | Status: AC
  Administered 2017-06-05: 10 mg via INTRAMUSCULAR
  Filled 2017-06-05: qty 3

## 2017-06-05 MED ORDER — CYCLOBENZAPRINE HCL 10 MG PO TABS
10.0000 mg | ORAL_TABLET | Freq: Once | ORAL | Status: AC
  Administered 2017-06-05: 10 mg via ORAL
  Filled 2017-06-05: qty 1

## 2017-06-05 NOTE — ED Provider Notes (Signed)
Patient placed in Quick Look pathway, seen and evaluated   Chief Complaint: neck pain  HPI:   Patient reports that about 6 months ago she was working out and boxing when she hurt her neck and had pain in her shoulders. Patient went to her PCP and was treated for inflammation. The pain continued and she was referred to ortho. She went to ortho and had x-rays and has been taking Naprosyn without relief. Patient called the office today to tell them her pain was a lot worse. Patient states that she was told to come to the ED for evaluation.  ROS: Neck pain  Physical Exam:  BP 123/86 (BP Location: Right Arm)   Pulse 86   Temp 98.7 F (37.1 C) (Oral)   Resp 16   Ht 5\' 3"  (1.6 m)   Wt 68 kg (150 lb)   LMP 08/16/2010   SpO2 99%   BMI 26.57 kg/m    Gen: No distress  Neuro: Awake and Alert  Skin: Warm and dry  Neck: tender with range of motion  Neuro: equal grips    Focused Exam:    Initiation of care has begun. The patient has been counseled on the process, plan, and necessity for staying for the completion/evaluation, and the remainder of the medical screening examination    Ashley Murrain, NP 06/05/17 1538    Tanna Furry, MD 06/06/17 2207

## 2017-06-05 NOTE — ED Provider Notes (Signed)
[facility] EMERGENCY DEPARTMENT Provider Note   CSN: [id number] Arrival date & time: 06/05/17  1455     History   Chief Complaint Chief Complaint  Patient presents with  . Neck Injury    HPI Kristen [name] is a 57 y.o. female who presents with neck pain.  Past medical history significant for hypothyroidism.  She states that 6-88-month ago she had a injury when she was boxing.  Since then she has been having waxing and waning constant neck and upper back pain which radiates into her bilateral arms.  She reports associated numbness and tingling which is intermittent.  She is going to her primary care provider for this and been prescribed naproxen which she has been taking and provides mild relief.  She has also gone to orthopedics and had an x-ray which showed "inflammation".  They recommended physical therapy.  Recently she has had worsening pain.  Pain has been uncontrolled by Aleve.  She called her primary care doctor again today who recommended for her to come to the ED for further evaluation.  She denies weakness of her arms or legs.  HPI  Past Medical History:  Diagnosis Date  . Anxiety   . Depression   . Hair loss   . Insomnia   . Migraine   . Post-surgical hypothyroidism    ?cancer    Patient Active Problem List   Diagnosis Date Noted  . 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/14/2014    History reviewed. No pertinent surgical history.  OB History    No data available       Home Medications    Prior to Admission medications   Medication Sig Start Date End Date Taking? Authorizing Provider  Aspirin-Salicylamide-Caffeine (BC HEADACHE POWDER PO) Take by mouth as needed.    [provider]  clonazePAM (KLONOPIN) 1 MG tablet TAKE 1 TABLET BY MOUTH EVERY DAY WITH MEAL. OCCASIONALLY EXTRA PILL AS NEEDED IF ANXIETY INCREASES. 11/29/14   [provider]  fexofenadine (ALLEGRA) 180 MG tablet Take 90  mg by mouth 2 (two) times daily.    [provider]  levothyroxine (SYNTHROID) 112 MCG tablet TAKE 1 TABLET BY MOUTH EVERY DAY BEFORE BREAKFAST 04/10/17   Philemon Kingdom, MD  methocarbamol (ROBAXIN) 500 MG tablet Take 1 tablet (500 mg total) by mouth 2 (two) times daily. 06/05/17   Recardo Evangelist, PA-C  rizatriptan (MAXALT) 5 MG tablet Take 5 mg by mouth as needed for migraine. May repeat in 2 hours if needed    [provider]  sertraline (ZOLOFT) 100 MG tablet Take 200 mg by mouth daily. 11/29/14   [provider]  traMADol (ULTRAM) 50 MG tablet Take 1 tablet (50 mg total) by mouth every 6 (six) hours as needed. 06/05/17   Recardo Evangelist, PA-C    Family History Family History  Problem Relation Age of Onset  . Hypertension Father   . Heart disease Father   . Healthy Mother     Social History Social History   Tobacco Use  . Smoking status: Never Smoker  . Smokeless tobacco: Never Used  Substance Use Topics  . Alcohol use: Yes    Alcohol/week: 0.0 oz    Comment: rarely  . Drug use: No     Allergies   Benadryl [diphenhydramine hcl]   Review of Systems Review of Systems  Musculoskeletal: Positive for back pain, myalgias and neck pain.  Neurological: Positive for numbness. Negative for weakness.  Physical Exam Updated Vital Signs BP 123/86 (BP Location: Right Arm)   Pulse 86   Temp 98.7 F (37.1 C) (Oral)   Resp 16   Ht 5\' 3"  (1.6 m)   Wt 68 kg (150 lb)   LMP 08/16/2010   SpO2 99%   BMI 26.57 kg/m   Physical Exam  Constitutional: She is oriented to person, place, and time. She appears well-developed and well-nourished. No distress.  HENT:  Head: Normocephalic and atraumatic.  Eyes: Conjunctivae are normal. Pupils are equal, round, and reactive to light. Right eye exhibits no discharge. Left eye exhibits no discharge. No scleral icterus.  Neck: Normal range of motion. Neck supple. Spinous process tenderness and muscular  tenderness (Bilateral SCM tenderness and trapezius tenderness.) present. No neck rigidity. No edema and no erythema present.  Cardiovascular: Normal rate.  Pulmonary/Chest: Effort normal. No respiratory distress.  Abdominal: She exhibits no distension.  Musculoskeletal:  5 out of 5 upper extremity strength  Neurological: She is alert and oriented to person, place, and time.  Skin: Skin is warm and dry.  Psychiatric: She has a normal mood and affect. Her behavior is normal.  Nursing note and vitals reviewed.    ED Treatments / Results  Labs (all labs ordered are listed, but only abnormal results are displayed) Labs Reviewed - No data to display  EKG  EKG Interpretation None       Radiology No results found.  Procedures Procedures (including critical care time)  Medications Ordered in ED Medications  ketorolac (TORADOL) 15 MG/ML injection 30 mg (not administered)  dexamethasone (DECADRON) injection 10 mg (not administered)  cyclobenzaprine (FLEXERIL) tablet 10 mg (10 mg Oral Given 06/05/17 1547)     Initial Impression / Assessment and Plan / ED Course  I have reviewed the triage vital signs and the nursing notes.  Pertinent labs & imaging results that were available during my care of the patient were reviewed by me and considered in my medical decision making (see chart for details).  57 year old female presents with symptoms consistent with cervical radiculopathy.  She has diffuse muscle tenderness and subjective decreased sensation of the bilateral arms.  She has normal strength.  Doubt any neurosurgical emergency.  Discussed with her that she will need an MRI but this is most appropriately done as an outpatient.  Discussed reasons for an MRI in the emergency department and she verbalized understanding.  She was offered pain control with IM Toradol and Decadron.  She was given a prescription for a muscle relaxer and short course of tramadol.  She is advised to follow-up  with orthopedics for outpatient MRI.  Final Clinical Impressions(s) / ED Diagnoses   Final diagnoses:  Cervical radiculopathy    ED Discharge Orders        Ordered    traMADol (ULTRAM) 50 MG tablet  Every 6 hours PRN     06/05/17 1716    methocarbamol (ROBAXIN) 500 MG tablet  2 times daily     06/05/17 1716       Recardo Evangelist, PA-C 06/05/17 1722    Sherwood Gambler, MD 06/06/17 [id number]

## 2017-06-05 NOTE — ED Triage Notes (Signed)
Pt reports chronic neck pain radiating to her shoulders and arm causing numbness in her hands.  Sxs onset was 6 months ago from working out.  She has been to a orthopedic and was instructed to take NSAIDs for the pain without relief.  States she had xrays done and was told she has inflammation on her neck.

## 2017-06-05 NOTE — ED Notes (Signed)
Pt stable, ambulatory, and verbalizes understanding of d/c instructions.  

## 2017-06-05 NOTE — Discharge Instructions (Signed)
Please continue to take Naproxen 500mg  twice daily for pain and inflammation Please take Robaxin for muscle tension/spasms - this medicine can make you sleepy so do not take before driving or drink alcohol with it Take Tramadol for moderate-severe pain as needed Follow up with [facility]

## 2017-06-07 ENCOUNTER — Telehealth: Payer: Self-pay | Admitting: Internal Medicine

## 2017-06-07 DIAGNOSIS — E890 Postprocedural hypothyroidism: Secondary | ICD-10-CM

## 2017-06-07 NOTE — Telephone Encounter (Signed)
Pt is having the following symptoms: Anxiety, extreme hot flashes, inflammation of the neck, shoulders & arms (she is taking med for inflammation). She just does not feel right. She has been seeing her PCP (she has menopause). Pt not sure if she should go to [facility] or get her thyroid/hormone levels checked with Dr. Cruzita Lederer. Please call pt at ph# [phone] to advise.

## 2017-06-07 NOTE — Telephone Encounter (Signed)
Please advise on below and if you feel like this should be handled in this office or not

## 2017-06-07 NOTE — Telephone Encounter (Signed)
Ok to come for a TSH, fT4 and fT3, can you please order?

## 2017-06-10 NOTE — Telephone Encounter (Signed)
LMTCB Labs ordered

## 2017-06-11 ENCOUNTER — Other Ambulatory Visit: Payer: Self-pay | Admitting: Physician Assistant

## 2017-06-11 DIAGNOSIS — M5412 Radiculopathy, cervical region: Secondary | ICD-10-CM

## 2017-06-12 ENCOUNTER — Telehealth: Payer: Self-pay | Admitting: Internal Medicine

## 2017-06-12 NOTE — Telephone Encounter (Signed)
Spoke to patient. Gave instructions. Patient verbalized understanding.

## 2017-06-12 NOTE — Telephone Encounter (Signed)
We will check her thyroid tests here, no problem.  Regarding the other hormones, yes, she may need to check with her gynecologist.

## 2017-06-12 NOTE — Telephone Encounter (Signed)
Spoke to patient. Advised labs entered. Scheduled lab appt.

## 2017-06-12 NOTE — Telephone Encounter (Signed)
Patient has appointment on 07/02/17. She would like to get her hormones, thyroid checked and any other tests that might help explain patient symptoms of sweating non-stop and feeling off-she is very tired and is having high anxiety. Not sleeping good. Patient needs to know if she can get these type labs done here or if she needs to go to her Gynecologist. Please call patient ph# [phone] to advise

## 2017-06-13 ENCOUNTER — Other Ambulatory Visit: Payer: Self-pay | Admitting: Internal Medicine

## 2017-06-13 ENCOUNTER — Other Ambulatory Visit (INDEPENDENT_AMBULATORY_CARE_PROVIDER_SITE_OTHER): Payer: BC Managed Care – PPO

## 2017-06-13 DIAGNOSIS — E890 Postprocedural hypothyroidism: Secondary | ICD-10-CM

## 2017-06-13 LAB — TSH: TSH: 0.19 u[IU]/mL — ABNORMAL LOW (ref 0.35–4.50)

## 2017-06-13 LAB — T4, FREE: FREE T4: 1.44 ng/dL (ref 0.60–1.60)

## 2017-06-13 LAB — T3, FREE: T3 FREE: 2.9 pg/mL (ref 2.3–4.2)

## 2017-06-13 MED ORDER — LEVOTHYROXINE SODIUM 100 MCG PO TABS: ORAL_TABLET | ORAL | 3 refills | Status: DC

## 2017-06-14 ENCOUNTER — Telehealth: Payer: Self-pay | Admitting: Internal Medicine

## 2017-06-14 NOTE — Telephone Encounter (Signed)
Patient would like someone to call with lab results,  Please advise

## 2017-06-14 NOTE — Telephone Encounter (Signed)
Kristen [name], For these types of messages, always check first if I did not release the labs in the are my chart.  In her case, I released him yesterday, but she does not appear to have read the message:  Written by Philemon Kingdom, MD on 06/13/2017 12:39 PM  Dear Kristen [name],  The TSH is a little low >> we will need to decrease the levothyroxine to 100 mcg daily. Because of your symptoms, you can actually skip the thyroid hormone for 2-3 days before starting the lower dose.  Let's recheck the tests in 5-6 weeks.  I will send this to your pharmacy.  Please call our main office number [phone]) to schedule a lab appointment or you can schedule this when you come for our appt at the end of the month.  Sincerely,  Philemon Kingdom MD

## 2017-06-17 NOTE — Telephone Encounter (Addendum)
[place] patient. She states she does not have a computer; unable to view MyChart. Gave results. Patient verbalized understanding. Will call back to schedule labs.

## 2017-07-02 ENCOUNTER — Ambulatory Visit: Payer: BC Managed Care – PPO | Admitting: Internal Medicine

## 2017-07-05 ENCOUNTER — Other Ambulatory Visit: Payer: BC Managed Care – PPO

## 2017-07-22 ENCOUNTER — Other Ambulatory Visit: Payer: Self-pay | Admitting: Internal Medicine

## 2017-07-22 ENCOUNTER — Other Ambulatory Visit (INDEPENDENT_AMBULATORY_CARE_PROVIDER_SITE_OTHER): Payer: BC Managed Care – PPO

## 2017-07-22 DIAGNOSIS — E890 Postprocedural hypothyroidism: Secondary | ICD-10-CM

## 2017-07-22 LAB — T4, FREE: Free T4: 1.16 ng/dL (ref 0.60–1.60)

## 2017-07-22 LAB — TSH: TSH: 0.21 u[IU]/mL — ABNORMAL LOW (ref 0.35–4.50)

## 2017-07-22 MED ORDER — LEVOTHYROXINE SODIUM 88 MCG PO TABS: ORAL_TABLET | ORAL | 5 refills | Status: DC

## 2017-08-09 ENCOUNTER — Other Ambulatory Visit: Payer: BC Managed Care – PPO

## 2017-09-06 ENCOUNTER — Telehealth: Payer: Self-pay | Admitting: Internal Medicine

## 2017-09-13 ENCOUNTER — Other Ambulatory Visit (INDEPENDENT_AMBULATORY_CARE_PROVIDER_SITE_OTHER): Payer: BC Managed Care – PPO

## 2017-09-13 ENCOUNTER — Telehealth: Payer: Self-pay | Admitting: Internal Medicine

## 2017-09-13 DIAGNOSIS — E890 Postprocedural hypothyroidism: Secondary | ICD-10-CM | POA: Diagnosis not present

## 2017-09-13 LAB — T4, FREE: FREE T4: 1.18 ng/dL (ref 0.60–1.60)

## 2017-09-13 LAB — TSH: TSH: 0.35 u[IU]/mL (ref 0.35–4.50)

## 2017-09-13 NOTE — Telephone Encounter (Signed)
No results yet as just drawn at 8am, but an [place]

## 2017-09-13 NOTE — Telephone Encounter (Signed)
Patient is unable to get her lab results on her computer. Please call ph# [phone] her lab results-if no answer leave detailed message. Second ph# [phone].

## 2017-12-10 ENCOUNTER — Telehealth: Payer: Self-pay | Admitting: Emergency Medicine

## 2017-12-10 DIAGNOSIS — E890 Postprocedural hypothyroidism: Secondary | ICD-10-CM

## 2017-12-10 NOTE — Telephone Encounter (Signed)
OK to check a TSH and fT4 but I would like to see her at one point w/in next 3-4 mo

## 2017-12-10 NOTE — Telephone Encounter (Signed)
Pt called and stated she would like to come in and have her labs drawn again for her thyroid. If this is ok can an order be put in so we can call patient back to schedule. Thanks.

## 2017-12-10 NOTE — Telephone Encounter (Signed)
Appointments made, labs ordered

## 2017-12-10 NOTE — Telephone Encounter (Signed)
Please advise on below pt has not had OV since 06/2016. Has had labs in 09/2017

## 2017-12-11 ENCOUNTER — Other Ambulatory Visit (INDEPENDENT_AMBULATORY_CARE_PROVIDER_SITE_OTHER): Payer: BC Managed Care – PPO

## 2017-12-11 DIAGNOSIS — E890 Postprocedural hypothyroidism: Secondary | ICD-10-CM

## 2017-12-11 LAB — TSH: TSH: 5.59 u[IU]/mL — AB (ref 0.35–4.50)

## 2017-12-11 LAB — T4, FREE: FREE T4: 0.96 ng/dL (ref 0.60–1.60)

## 2018-01-02 ENCOUNTER — Telehealth: Payer: Self-pay | Admitting: Internal Medicine

## 2018-01-02 NOTE — Telephone Encounter (Signed)
Wanting to know the status of medical records request that was sent 9.19.19 from his firm. Please advise if it was received

## 2018-01-03 NOTE — Telephone Encounter (Signed)
Kristen [name] is calling back wanting to know status of medical records request. Please advise

## 2018-01-03 NOTE — Telephone Encounter (Signed)
Contacted law firm and informed them we do not handle medical records and I gave them the number to call [phone]

## 2018-01-10 ENCOUNTER — Encounter: Payer: Self-pay | Admitting: Internal Medicine

## 2018-01-10 ENCOUNTER — Ambulatory Visit: Payer: BC Managed Care – PPO | Admitting: Internal Medicine

## 2018-01-10 VITALS — BP 118/80 | HR 77 | Ht 63.0 in | Wt 142.0 lb

## 2018-01-10 DIAGNOSIS — E890 Postprocedural hypothyroidism: Secondary | ICD-10-CM | POA: Diagnosis not present

## 2018-01-10 LAB — TSH: TSH: 1.74 u[IU]/mL (ref 0.35–4.50)

## 2018-01-10 LAB — T4, FREE: FREE T4: 1.36 ng/dL (ref 0.60–1.60)

## 2018-01-10 NOTE — Progress Notes (Signed)
Patient ID: Kristen [name], female   DOB: Aug 09, 1960, 57 y.o.   MRN: [id number]   HPI  Kristen [name] is a 57 y.o.-year-old female, returning for f/u for postablative hypothyroidism, dx'ed in 03/2006 after RAI ablation for Graves ds. Last visit 1.5 years ago. PCP: [facility]  ObGyn: Dr Dellis Filbert.   She had pain meds for shoulder pain recently.  Pt is on Synthroid 88 mcg daily, taken: - 5:30 am - fasting - at least 30 min from b'fast - no Ca, Fe,PPIs - + Multivitamins at lunchtime - not on Biotin  Reviewed patient's TFTs: Lab Results  Component Value Date   TSH 5.59 (H) 12/11/2017   TSH 0.35 09/13/2017   TSH 0.21 (L) 07/22/2017   TSH 0.19 (L) 06/13/2017   TSH 0.46 08/20/2016   TSH 0.41 07/02/2016   FREET4 0.96 12/11/2017   FREET4 1.18 09/13/2017   FREET4 1.16 07/22/2017   FREET4 1.44 06/13/2017   FREET4 0.97 08/20/2016   FREET4 1.10 07/02/2016  10/21/2014: TSH 8.210 09/01/2014: TSH 1.590 07/26/2014: TSH 0.440 06/28/2014: TSH 0.110 06/09/2014: TSH 0.150 06/09/2014: TSH 0.300 (0.34-4.8) 04/26/2014: TSH 1.490 12/11/2013: TSH 0.290 (0.34-4.8)  Pt denies: - feeling nodules in neck - hoarseness - dysphagia - choking - SOB with lying down  She has no FH of thyroid disorders. No FH of thyroid cancer. No h/o radiation tx to head or neck other than RAI treatment.  No Biotin use. No recent steroids use.   She continues to have anxiety/PTSD - after she found her son in law dead in her yard - hanged himself.  ROS: Constitutional: no weight gain/no weight loss, no fatigue, no subjective hyperthermia, no subjective hypothermia Eyes: no blurry vision, no xerophthalmia ENT: no sore throat, + see HPI Cardiovascular: no CP/no SOB/no palpitations/no leg swelling Respiratory: no cough/no SOB/no wheezing Gastrointestinal: no N/no V/no D/no C/no acid reflux Musculoskeletal: no muscle aches/+ joint aches (shoulder) Skin: no rashes, no hair loss Neurological: no tremors/no  numbness/no tingling/no dizziness  I reviewed pt's medications, allergies, PMH, social hx, family hx, and changes were documented in the history of present illness. Otherwise, unchanged from my initial visit note.  PMH: - h/o Graves' disease - Post ablative hypothyroidism - Anxiety /depression - Postmenopausal status   PMH: - no Sx hx   Social History   Social History  . Marital Status: Married    Spouse Name: N/A  . Number of Children: 1   Occupational History  . N/a     Smoking status: Never Smoker   . Smokeless tobacco: No  . Alcohol Use: No  . Drug Use: No   Current Outpatient Medications  Medication Sig Dispense Refill  . Aspirin-Salicylamide-Caffeine (BC HEADACHE POWDER PO) Take by mouth as needed.    . clonazePAM (KLONOPIN) 1 MG tablet TAKE 1 TABLET BY MOUTH EVERY DAY WITH MEAL. OCCASIONALLY EXTRA PILL AS NEEDED IF ANXIETY INCREASES.  1  . fexofenadine (ALLEGRA) 180 MG tablet Take 90 mg by mouth 2 (two) times daily.    Marland Kitchen levothyroxine (SYNTHROID, LEVOTHROID) 88 MCG tablet TAKE 1 TABLET BY MOUTH EVERY DAY BEFORE BREAKFAST 45 tablet 5  . methocarbamol (ROBAXIN) 500 MG tablet Take 1 tablet (500 mg total) by mouth 2 (two) times daily. 20 tablet 0  . rizatriptan (MAXALT) 5 MG tablet Take 5 mg by mouth as needed for migraine. May repeat in 2 hours if needed    . sertraline (ZOLOFT) 100 MG tablet Take 200 mg by mouth daily.  1  . traMADol (ULTRAM)  50 MG tablet Take 1 tablet (50 mg total) by mouth every 6 (six) hours as needed. 15 tablet 0   No current facility-administered medications for this visit.    Allergies  Allergen Reactions  . Benadryl [Diphenhydramine Hcl] Other (See Comments)    Makes her skin crawl and she gets hyped up    Family history: - No thyroid family history  - No diabetes, hypertension, hyperlipidemia, cancer in family members   PE: BP 118/80   Pulse 77   Ht 5\' 3"  (1.6 m)   Wt 142 lb (64.4 kg)   LMP 08/16/2010   SpO2 98%   BMI 25.15 kg/m   Body mass index is 25.15 kg/m. Wt Readings from Last 3 Encounters:  01/10/18 142 lb (64.4 kg)  06/05/17 150 lb (68 kg)  07/02/16 145 lb (65.8 kg)   Constitutional: Normal weight, in NAD Eyes: PERRLA, EOMI, no exophthalmos ENT: moist mucous membranes, no thyromegaly, no cervical lymphadenopathy Cardiovascular: RRR, No MRG Respiratory: CTA B Gastrointestinal: abdomen soft, NT, ND, BS+ Musculoskeletal: no deformities, strength intact in all 4 Skin: moist, warm, no rashes Neurological: no tremor with outstretched hands, DTR normal in all 4  ASSESSMENT: 1.  Post ablative Hypothyroidism  PLAN:  1. Patient with long-standing hypothyroidism, developed after RAI ablation for Graves' disease, with uncontrolled TFTs over the years.  Her test improved on Synthroid d.a.w. but they were still not completely controlled.  At last visit, she was telling me that she felt an internal tremor, which has now resolved.  She was also not sleeping well, now this is better.  Her tests were normal, but the TSH was 0.41, close to the lower limit of normal at that time so I advised her to decrease the levothyroxine dose to 112 mcg daily.  We subsequently decrease the doses even further and she is currently on levothyroxine 88 mcg daily.  On this dose, last TSH was elevated, at 5.59 on 12/11/2017.  At that time, I sent her a message through my chart asking her whether she took the medication consistently but she did not answer preferring to wait for the appointment to change the dose, so she continues on 88 mcg daily. - pt feels good on this dose, but has fatigue. - we discussed about taking the thyroid hormone every day, with water, >30 minutes before breakfast, separated by >4 hours from acid reflux medications, calcium, iron, multivitamins. Pt. is taking it correctly, and not misses any doses. - will check thyroid tests today: TSH and fT4 - If labs are abnormal, she will need to return for repeat TFTs in 1.5 months -  refuses flu shot today - I will see her back in a year  - time spent with the patient: 15 minutes, of which >50% was spent in obtaining information about her symptoms, reviewing her previous labs, evaluations, and treatments, counseling her about her condition (please see the discussed topics above), and developing a plan to further investigate and treat it.  Component     Latest Ref Rng & Units 01/10/2018  TSH     0.35 - 4.50 uIU/mL 1.74  T4,Free(Direct)     0.60 - 1.60 ng/dL 1.36   Thyroid tests are normal.  She can continue on levothyroxine 88 mcg daily.  Philemon Kingdom, MD PhD [facility]

## 2018-01-10 NOTE — Patient Instructions (Signed)
Please stop at the lab.  Continue Synthroid 88 mcg daily.  Take the thyroid hormone every day, with water, at least 30 minutes before breakfast, separated by at least 4 hours from: - acid reflux medications - calcium - iron - multivitamins  Please return in 1 year.

## 2018-03-21 ENCOUNTER — Ambulatory Visit: Payer: BC Managed Care – PPO | Admitting: Physical Therapy

## 2018-04-04 ENCOUNTER — Ambulatory Visit: Payer: BC Managed Care – PPO | Admitting: Physical Therapy

## 2018-04-24 ENCOUNTER — Other Ambulatory Visit: Payer: Self-pay | Admitting: Internal Medicine

## 2018-06-19 ENCOUNTER — Telehealth: Payer: Self-pay | Admitting: Internal Medicine

## 2018-06-19 DIAGNOSIS — E890 Postprocedural hypothyroidism: Secondary | ICD-10-CM

## 2018-06-19 NOTE — Telephone Encounter (Signed)
Patient stated that she has been feeling bad and thinks it is time for her to have this rechecked.  Please advise

## 2018-06-19 NOTE — Telephone Encounter (Signed)
Kristen [name], can you please order a TSH and the free T4?

## 2018-06-19 NOTE — Telephone Encounter (Signed)
I assume she is talking about her thyroid labs.

## 2018-07-03 ENCOUNTER — Other Ambulatory Visit: Payer: Self-pay

## 2018-07-03 ENCOUNTER — Other Ambulatory Visit (INDEPENDENT_AMBULATORY_CARE_PROVIDER_SITE_OTHER): Payer: BC Managed Care – PPO

## 2018-07-03 DIAGNOSIS — E890 Postprocedural hypothyroidism: Secondary | ICD-10-CM | POA: Diagnosis not present

## 2018-07-04 LAB — T4, FREE: Free T4: 1.06 ng/dL (ref 0.60–1.60)

## 2018-07-04 LAB — TSH: TSH: 0.72 u[IU]/mL (ref 0.35–4.50)

## 2018-07-07 ENCOUNTER — Encounter: Payer: Self-pay | Admitting: *Deleted

## 2018-11-26 ENCOUNTER — Other Ambulatory Visit: Payer: Self-pay

## 2018-11-28 ENCOUNTER — Other Ambulatory Visit: Payer: Self-pay

## 2018-11-28 ENCOUNTER — Encounter: Payer: Self-pay | Admitting: Internal Medicine

## 2018-11-28 ENCOUNTER — Ambulatory Visit (INDEPENDENT_AMBULATORY_CARE_PROVIDER_SITE_OTHER): Payer: Medicare Other | Admitting: Internal Medicine

## 2018-11-28 VITALS — BP 122/60 | HR 75 | Ht 63.0 in | Wt 133.0 lb

## 2018-11-28 DIAGNOSIS — E890 Postprocedural hypothyroidism: Secondary | ICD-10-CM

## 2018-11-28 DIAGNOSIS — L659 Nonscarring hair loss, unspecified: Secondary | ICD-10-CM

## 2018-11-28 DIAGNOSIS — R1310 Dysphagia, unspecified: Secondary | ICD-10-CM | POA: Diagnosis not present

## 2018-11-28 LAB — T4, FREE: Free T4: 1.08 ng/dL (ref 0.60–1.60)

## 2018-11-28 LAB — TSH: TSH: 2.5 u[IU]/mL (ref 0.35–4.50)

## 2018-11-28 NOTE — Patient Instructions (Addendum)
Please stop at the lab.  Continue Synthroid 88 mcg daily.  Take the thyroid hormone every day, with water, at least 30 minutes before breakfast, separated by at least 4 hours from: - acid reflux medications - calcium - iron - multivitamins  For the hair loss, you may need to see dermatology: Lois Huxley, MD  [place]  Please return in 1 year.

## 2018-11-28 NOTE — Progress Notes (Signed)
Patient ID: Kristen [name], female   DOB: Oct 23, 1960, 58 y.o.   MRN: [id number]   HPI  Kristen [name] is a 58 y.o.-year-old female, returning for f/u for postablative hypothyroidism, dx'ed in 03/2006 after RAI ablation for Graves ds. Last visit 10 months ago. PCP: [facility]  ObGyn: Dr Dellis Filbert.   Since last OV, she noticed that her hair is falling more. She also feels more fatigued. She also has the sensation of "something dropping inside" when she lays down (not palpitations).  Pt is on Synthroid d.a.w. 88 Mcg daily, taken: - in am (5:30 am) - fasting - at least 30 min from b'fast - no Ca, Fe, PPIs - + MVI at lunchtime - + on Biotin - Hair Skin and Nails - last dose yesterday She also added probiotics since last visit, but stopped.  Reviewed her TFTs: Lab Results  Component Value Date   TSH 0.72 07/03/2018   TSH 1.74 01/10/2018   TSH 5.59 (H) 12/11/2017   TSH 0.35 09/13/2017   TSH 0.21 (L) 07/22/2017   TSH 0.19 (L) 06/13/2017   FREET4 1.06 07/03/2018   FREET4 1.36 01/10/2018   FREET4 0.96 12/11/2017   FREET4 1.18 09/13/2017   FREET4 1.16 07/22/2017   FREET4 1.44 06/13/2017  10/21/2014: TSH 8.210 09/01/2014: TSH 1.590 07/26/2014: TSH 0.440 06/28/2014: TSH 0.110 06/09/2014: TSH 0.150 06/09/2014: TSH 0.300 (0.34-4.8) 04/26/2014: TSH 1.490 12/11/2013: TSH 0.290 (0.34-4.8)  Pt denies: - feeling nodules in neck - hoarseness - choking - SOB with lying down + occasional dysphagia  - resolved with salt water gargling.   She has no FH of thyroid disorders. No FH of thyroid cancer. No h/o radiation tx to head or neck other than RAI treatment for Graves' disease.  No herbal supplements. No recent steroids use.   She continues to have anxiety/PTSD - after she found her son in law dead in her yard - hanged himself.  ROS: Constitutional: no weight gain/+ weight loss (diet), + fatigue, no subjective hyperthermia, no subjective hypothermia Eyes: no blurry vision, no  xerophthalmia ENT: no sore throat, + see HPI Cardiovascular: no CP/no SOB/no palpitations/no leg swelling Respiratory: no cough/no SOB/no wheezing Gastrointestinal: no N/no V/no D/no C/no acid reflux Musculoskeletal: no muscle aches/no joint aches Skin: no rashes, + hair loss Neurological: no tremors/no numbness/no tingling/no dizziness  I reviewed pt's medications, allergies, PMH, social hx, family hx, and changes were documented in the history of present illness. Otherwise, unchanged from my initial visit note.  PMH: - h/o Graves' disease - Post ablative hypothyroidism - Anxiety /depression - Postmenopausal status   PMH: - no Sx hx   Social History   Social History  . Marital Status: Married    Spouse Name: N/A  . Number of Children: 1   Occupational History  . N/a     Smoking status: Never Smoker   . Smokeless tobacco: No  . Alcohol Use: No  . Drug Use: No   Current Outpatient Medications  Medication Sig Dispense Refill  . Aspirin-Salicylamide-Caffeine (BC HEADACHE POWDER PO) Take by mouth as needed.    . clonazePAM (KLONOPIN) 1 MG tablet TAKE 1 TABLET BY MOUTH EVERY DAY WITH MEAL. OCCASIONALLY EXTRA PILL AS NEEDED IF ANXIETY INCREASES.  1  . fexofenadine (ALLEGRA) 180 MG tablet Take 90 mg by mouth 2 (two) times daily.    Marland Kitchen levothyroxine (SYNTHROID, LEVOTHROID) 88 MCG tablet TAKE 1 TABLET BY MOUTH EVERY DAY BEFORE BREAKFAST 90 tablet 2  . methocarbamol (ROBAXIN) 500 MG tablet Take 1  tablet (500 mg total) by mouth 2 (two) times daily. (Patient not taking: Reported on 01/10/2018) 20 tablet 0  . rizatriptan (MAXALT) 5 MG tablet Take 5 mg by mouth as needed for migraine. May repeat in 2 hours if needed    . sertraline (ZOLOFT) 100 MG tablet Take 200 mg by mouth daily.  1  . traMADol (ULTRAM) 50 MG tablet Take 1 tablet (50 mg total) by mouth every 6 (six) hours as needed. (Patient not taking: Reported on 01/10/2018) 15 tablet 0   No current facility-administered medications  for this visit.    Allergies  Allergen Reactions  . Benadryl [Diphenhydramine Hcl] Other (See Comments)    Makes her skin crawl and she gets hyped up    Family history: - No thyroid family history  - No diabetes, hypertension, hyperlipidemia, cancer in family members   PE: BP 122/60   Pulse 75   Ht 5\' 3"  (1.6 m)   Wt 133 lb (60.3 kg)   LMP 08/16/2010   SpO2 97%   BMI 23.56 kg/m  Body mass index is 23.56 kg/m. Wt Readings from Last 3 Encounters:  11/28/18 133 lb (60.3 kg)  01/10/18 142 lb (64.4 kg)  06/05/17 150 lb (68 kg)   Constitutional: normal weight, in NAD Eyes: PERRLA, EOMI, no exophthalmos ENT: moist mucous membranes, no thyromegaly, no cervical lymphadenopathy Cardiovascular: RRR, No MRG Respiratory: CTA B Gastrointestinal: abdomen soft, NT, ND, BS+ Musculoskeletal: no deformities, strength intact in all 4 Skin: moist, warm, no rashes Neurological: no tremor with outstretched hands, DTR normal in all 4  ASSESSMENT: 1.  Post ablative Hypothyroidism  2. Hair loss  3. Dysphagia  PLAN:  1. Patient with longstanding hypothyroidism developed after RAI ablation for Graves' disease, with uncontrolled TFTs over the years.  Her tests improved after we switch to Synthroid d.a.w. but they were still not completely controlled. -We reviewed together her most recent TFTs and they were normal in 01/2018 and 06/2018. - she continues on LT4 88 mcg daily - pt feels good on this dose but still has fatigue and hair loss - worsening. - we discussed about taking the thyroid hormone every day, with water, >30 minutes before breakfast, separated by >4 hours from acid reflux medications, calcium, iron, multivitamins. Pt. is taking it correctly and does not miss doses. - will check thyroid tests in 6 months: TSH and fT4 - If labs are abnormal, she will need to return for repeat TFTs in 1.5 months - OTW, I will see her back in a year  2. Hair loss -She noticed that this worsened in  the last few months -She tried a hair skin and nails vitamin but this did not help.  She is taking this sporadically now. -We will check her TFTs as we discussed that if they are in an abnormal range they can definitely impress her hair loss -However, if the TFTs are normal, I suggested to see dermatology.  Given reference.  3. Dysphagia -She has occasional dysphagia which appears to be resolved when she gargles with salt water -In this case, this is most likely related to her allergies. -She does double up on her allergy pill whenever this happens and dysphagia resolves. -She does not have an enlarged thyroid on palpation today.  No other neck compression symptoms.  Patient Instructions  Please stop at the lab.  Continue Synthroid 88 mcg daily.  Take the thyroid hormone every day, with water, at least 30 minutes before breakfast, separated by at  least 4 hours from: - acid reflux medications - calcium - iron - multivitamins  For the hair loss, you may need to see dermatology: Lois Huxley, MD  [place]  Please return in 1 year.  Office Visit on 11/28/2018  Component Date Value Ref Range Status  . Free T4 11/28/2018 1.08  0.60 - 1.60 ng/dL Final   Comment: Specimens from patients who are undergoing biotin therapy and /or ingesting biotin supplements may contain high levels of biotin.  The higher biotin concentration in these specimens interferes with this Free T4 assay.  Specimens that contain high levels  of biotin may cause false high results for this Free T4 assay.  Please interpret results in light of the total clinical presentation of the patient.    Marland Kitchen TSH 11/28/2018 2.50  0.35 - 4.50 uIU/mL Final   The thyroid tests are normal.  Philemon Kingdom, MD PhD [facility]

## 2018-12-15 ENCOUNTER — Other Ambulatory Visit: Payer: Self-pay | Admitting: Internal Medicine

## 2019-01-16 ENCOUNTER — Ambulatory Visit: Payer: BC Managed Care – PPO | Admitting: Internal Medicine

## 2019-06-30 ENCOUNTER — Other Ambulatory Visit: Payer: Self-pay | Admitting: Internal Medicine

## 2020-01-15 ENCOUNTER — Ambulatory Visit: Payer: BC Managed Care – PPO | Admitting: Family Medicine

## 2020-03-16 NOTE — Progress Notes (Signed)
Kristen [name] Phone: [phone] Subjective:   Fontaine No, am serving as a scribe for Dr. Hulan Saas. This visit occurred during the SARS-CoV-2 public health emergency.  Safety protocols were in place, including screening questions prior to the visit, additional usage of staff PPE, and extensive cleaning of exam room while observing appropriate contact time as indicated for disinfecting solutions.   I'm seeing this patient by the request  of:  Kristen Bruins, MD  CC: Right shoulder and neck pain follow-up  [id number]  Kristen [name] is a 59 y.o. female coming in with complaint of neck and shoulder pain. Patient states that she had a tree fall on her shoulder years ago. 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 Patient states that a few years ago she was boxing and felt pain in right shoulder and neck. Had cortisone injections from primary care. Injection did not provide relief. Has seen Dr. Vertell Limber. He recommended epidural injections and this did not help her pain. Has been seeing a chiropractor named Joe Drapper. Did feel some improvement. Symptoms worsen with right shoulder flexion. 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 Has had MRI of spine and shoulder. Was told that she has damage to RTC. Has also seen Dr. Maxie Better with [facility]. Was put on gabapentin 300mg , doing PT and was given cortisone injection. Further MRIs performed on patient's back through [facility]. Small cyst was found on MRI. Patient was sent back to Dr. Vertell Limber. Was recommended to do yoga. Patient tried yoga which did help somewhat but recently she has had an increase in her pain. Patient given trigger point injections and NSAIDs. Tried another round of physical therapy which caused an increase in her pain. Has had 3 rounds of magnet therapy which seems  to be helping her pain. Patient's sister is Kristen [name].    Patient did bring in the disc for the MRI.  MRI was independently visualized by me.  Patient does have 3 partial tears of the rotator cuff 1 in the subscapularis, 1 in the supraspinatus and 1 in the infraspinatus.  No true retraction noted.  This still does seem to be in the red zone.  Patient does have what appears to be a type II acromion.  Past Medical History:  Diagnosis Date  . Anxiety   . Depression   . Hair loss   . Insomnia   . Migraine   . Post-surgical hypothyroidism    ?cancer   No past surgical history on file. Social History   Socioeconomic History  . Marital status: Married    Spouse name: Not on file  . Number of children: 1  . Years of education: Not on file  . Highest education level: Not on file  Occupational History    Comment: unemployed  Tobacco Use  . Smoking status: Never Smoker  . Smokeless tobacco: Never Used  Substance and Sexual Activity  . Alcohol use: Yes    Alcohol/week: 0.0 standard drinks    Comment: rarely  . Drug use: No  . Sexual activity: Not on file  Other Topics Concern  . Not on file  Social History Narrative   Lives with husband.   Drinks only water. Denies caffeine use.   Social Determinants of Health   Financial Resource Strain: Not on file  Food Insecurity: Not on file  Transportation Needs: Not on file  Physical Activity: Not on file  Stress: Not on file  Social Connections: Not on file   Allergies  Allergen Reactions  . Benadryl [Diphenhydramine Hcl] Other (See Comments)    Makes her skin crawl and she gets hyped up    Family History  Problem Relation Age of Onset  . Hypertension Father   . Heart disease Father   . Healthy Mother     Current Outpatient Medications (Endocrine & Metabolic):  .  levothyroxine (SYNTHROID) 88 MCG tablet, TAKE 1 TABLET BY MOUTH EVERY DAY BEFORE BREAKFAST   Current Outpatient Medications (Respiratory):  .  fexofenadine  (ALLEGRA) 180 MG tablet, Take 90 mg by mouth 2 (two) times daily.  Current Outpatient Medications (Analgesics):  Marland Kitchen  Aspirin-Salicylamide-Caffeine (BC HEADACHE POWDER PO), Take by mouth as needed. .  rizatriptan (MAXALT) 5 MG tablet, Take 5 mg by mouth as needed for migraine. May repeat in 2 hours if needed .  traMADol (ULTRAM) 50 MG tablet, Take 1 tablet (50 mg total) by mouth every 6 (six) hours as needed. .  meloxicam (MOBIC) 7.5 MG tablet, Take 1 tablet (7.5 mg total) by mouth daily.   Current Outpatient Medications (Other):  .  clonazePAM (KLONOPIN) 1 MG tablet, TAKE 1 TABLET BY MOUTH EVERY DAY WITH MEAL. OCCASIONALLY EXTRA PILL AS NEEDED IF ANXIETY INCREASES. .  methocarbamol (ROBAXIN) 500 MG tablet, Take 1 tablet (500 mg total) by mouth 2 (two) times daily. .  sertraline (ZOLOFT) 100 MG tablet, Take 200 mg by mouth daily. Marland Kitchen  tiZANidine (ZANAFLEX) 2 MG tablet, Take 1 tablet (2 mg total) by mouth at bedtime.   Reviewed prior external information including notes and imaging from  primary care provider As well as notes that were available from care everywhere and other healthcare systems.  Past medical history, social, surgical and family history all reviewed in electronic medical record.  No pertanent information unless stated regarding to the chief complaint.   Review of Systems:  No headache, visual changes, nausea, vomiting, diarrhea, constipation, dizziness, abdominal pain, skin rash, fevers, chills, night sweats, weight loss, swollen lymph nodes, body aches, joint swelling, chest pain, shortness of breath, mood changes. POSITIVE muscle aches  Objective  Blood pressure 114/82, pulse 79, height 5\' 3"  (1.6 m), weight 134 lb (60.8 kg), last menstrual period 08/16/2010, SpO2 98 %.   General: No apparent distress alert and oriented x3 mood and affect normal, dressed appropriately.  Patient does appear to be anxious. HEENT: Pupils equal, extraocular movements intact  Respiratory:  Patient's speak in full sentences and does not appear short of breath  Cardiovascular: No lower extremity edema, non tender, no erythema  Gait normal with good balance and coordination.  MSK: Patient's right shoulder shows the patient does have positive impingement noted.  4 out of 5 strength of the rotator cuff.  Patient though is very painful with the testing.  Positive impingement with Hawkins.  Contralateral shoulder unremarkable.  Neck exam does have loss of lordosis.  Full sidebending.  Patient has negative Spurling's.  5-5 strength of the hands bilaterally.  Deep tendon reflexes appear to be intact    Impression and Recommendations:     The above documentation has been reviewed and is accurate and complete Lyndal Pulley, DO

## 2020-03-17 ENCOUNTER — Other Ambulatory Visit: Payer: Self-pay

## 2020-03-17 ENCOUNTER — Ambulatory Visit (INDEPENDENT_AMBULATORY_CARE_PROVIDER_SITE_OTHER): Payer: Medicare Other | Admitting: Family Medicine

## 2020-03-17 ENCOUNTER — Encounter: Payer: Self-pay | Admitting: Family Medicine

## 2020-03-17 VITALS — BP 114/82 | HR 79 | Ht 63.0 in | Wt 134.0 lb

## 2020-03-17 DIAGNOSIS — M75111 Incomplete rotator cuff tear or rupture of right shoulder, not specified as traumatic: Secondary | ICD-10-CM | POA: Diagnosis not present

## 2020-03-17 DIAGNOSIS — M25511 Pain in right shoulder: Secondary | ICD-10-CM | POA: Diagnosis not present

## 2020-03-17 DIAGNOSIS — M75101 Unspecified rotator cuff tear or rupture of right shoulder, not specified as traumatic: Secondary | ICD-10-CM | POA: Insufficient documentation

## 2020-03-17 DIAGNOSIS — G8929 Other chronic pain: Secondary | ICD-10-CM | POA: Diagnosis not present

## 2020-03-17 MED ORDER — MELOXICAM 7.5 MG PO TABS: 7.5000 mg | ORAL_TABLET | Freq: Every day | ORAL | 0 refills | Status: DC

## 2020-03-17 MED ORDER — TIZANIDINE HCL 2 MG PO TABS: 2.0000 mg | ORAL_TABLET | Freq: Every day | ORAL | 0 refills | Status: DC

## 2020-03-17 NOTE — Assessment & Plan Note (Signed)
Patient does have a tear of the right rotator cuff.  Patient has done physical therapy x2, 2 intra-articular injections and has had no improvement.  All of them seem to be low-grade articular partial tears of the subscapularis, supraspinatus and infraspinatus.  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  Discussed with patient in great length.  We discussed the potential for PRP but I think honestly with the amount of pain and how it is affecting daily activities patient should consider the possibility of surgical intervention.  I would like patient to have a another opinion with her not feeling as confident in the other orthopedic surgeon at this time.  Patient will be referred today.  For the pain relief we discussed low dose of meloxicam warned potential side effects and to discontinue the ibuprofen.  Zanaflex given for nighttime pain.  Follow-up again as needed

## 2020-03-17 NOTE — Patient Instructions (Addendum)
Meloxicam 7.5mg   Do not use NSAIDS such as Advil or Aleve when taking Meloxicam It is ok to use Tylenol for additional pain relief  Zanaflex 2mg  Stop gabapentin Pennsaid 2x a day, finger tip sized amount See Dr. Tamera Punt at [facility] will cal you to schedule

## 2020-03-18 ENCOUNTER — Telehealth: Payer: Self-pay | Admitting: *Deleted

## 2020-03-18 NOTE — Telephone Encounter (Signed)
Spoke with patient. She feels that her pain is increasing. Stopped taking gabapentin and started zanaflex and meloxicam. Per a verbal from Dr. Tamala Julian, patient is so start her 300mg  gabapentin at night and discontinue zanaflex. She is to take 7.5 mg meloxicam during the day.  If pain does not improve patient is instructed to go into ED. Patient has appointment with Dr. Tamera Punt on December 17th.

## 2020-03-18 NOTE — Telephone Encounter (Signed)
Pt called stating that she is in a lot of pain with her R shoulder & neck & would like a call back to discuss.

## 2020-03-28 ENCOUNTER — Other Ambulatory Visit: Payer: Self-pay | Admitting: Orthopedic Surgery

## 2020-03-30 ENCOUNTER — Encounter (HOSPITAL_BASED_OUTPATIENT_CLINIC_OR_DEPARTMENT_OTHER): Payer: Self-pay | Admitting: Orthopedic Surgery

## 2020-03-30 ENCOUNTER — Other Ambulatory Visit: Payer: Self-pay

## 2020-04-04 ENCOUNTER — Telehealth: Payer: Self-pay | Admitting: Internal Medicine

## 2020-04-04 NOTE — Telephone Encounter (Signed)
Patient requests to be called at ph# [phone] re: Patient requests to have thyroid lab work done before having shoulder surgery 04/11/20.

## 2020-04-04 NOTE — Telephone Encounter (Signed)
She has an appointment tomorrow morning at 8.  I will check her labs then.

## 2020-04-04 NOTE — Telephone Encounter (Signed)
Please see below.

## 2020-04-05 ENCOUNTER — Ambulatory Visit: Payer: BC Managed Care – PPO | Admitting: Family Medicine

## 2020-04-05 ENCOUNTER — Other Ambulatory Visit: Payer: Self-pay

## 2020-04-05 ENCOUNTER — Encounter: Payer: Self-pay | Admitting: Internal Medicine

## 2020-04-05 ENCOUNTER — Ambulatory Visit (INDEPENDENT_AMBULATORY_CARE_PROVIDER_SITE_OTHER): Payer: Medicare Other | Admitting: Internal Medicine

## 2020-04-05 VITALS — BP 120/80 | HR 80 | Ht 63.0 in | Wt 130.6 lb

## 2020-04-05 DIAGNOSIS — E890 Postprocedural hypothyroidism: Secondary | ICD-10-CM | POA: Diagnosis not present

## 2020-04-05 LAB — TSH: TSH: 0.41 u[IU]/mL (ref 0.35–4.50)

## 2020-04-05 LAB — T4, FREE: Free T4: 1.62 ng/dL — ABNORMAL HIGH (ref 0.60–1.60)

## 2020-04-05 NOTE — Patient Instructions (Signed)
Please stop at the lab.  Continue Synthroid 88 mcg daily.  Take the thyroid hormone every day, with water, at least 30 minutes before breakfast, separated by at least 4 hours from: - acid reflux medications - calcium - iron - multivitamins  Please return in 1 year.  

## 2020-04-05 NOTE — Telephone Encounter (Signed)
Noted  

## 2020-04-05 NOTE — Progress Notes (Signed)
Patient ID: Kristen [name], female   DOB: [birth year], 59 y.o.   MRN: [id number]  This visit occurred during the SARS-CoV-2 public health emergency.  Safety protocols were in place, including screening questions prior to the visit, additional usage of staff PPE, and extensive cleaning of exam room while observing appropriate contact time as indicated for disinfecting solutions.   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 Last visit 1 year and 4 months ago. PCP: [facility]: Dr Dellis Filbert.   She will soon (04/11/2020) have Sx for rotator cuff. She had several steroid injections - they did not help - last 01/2020.  Pt is on LT4 88 mcg daily, taken: - in am (5:30 AM) - fasting - at least 30 min from b'fast - no calcium - no iron - stopped multivitamins at lunchtime - no PPIs - stopped Biotin-in Hair skin and nails vitamins - 1-2 mo ago Now on Advil and Gabapentin.  Reviewed her TFTs: Lab Results  Component Value Date   TSH 2.50 11/28/2018   TSH 0.72 07/03/2018   TSH 1.74 01/10/2018   TSH 5.59 (H) 12/11/2017   TSH 0.35 09/13/2017   TSH 0.21 (L) 07/22/2017   FREET4 1.08 11/28/2018   FREET4 1.06 07/03/2018   FREET4 1.36 01/10/2018   FREET4 0.96 12/11/2017   FREET4 1.18 09/13/2017   FREET4 1.16 07/22/2017  10/21/2014: TSH 8.210 09/01/2014: TSH 1.590 07/26/2014: TSH 0.440 06/28/2014: TSH 0.110 06/09/2014: TSH 0.150 06/09/2014: TSH 0.300 (0.34-4.8) 04/26/2014: TSH 1.490 12/11/2013: TSH 0.290 (0.34-4.8)  Pt denies: - feeling nodules in neck - hoarseness - choking - SOB with lying down  She continues to have: - dysphagia (she had this in the past resolved after gargling with salt water, but now persists)  No FH of thyroid disease or cancer. No h/o radiation tx to head or neck other than RAI treatment for Graves' disease.  No herbal supplements. No Biotin use.    She continues to have anxiety/PTSD - after she found her son in law dead in her yard - hanged himself.  Constitutional: no weight gain/no weight loss, no fatigue, no subjective hyperthermia, no subjective hypothermia Eyes: no blurry vision, no xerophthalmia ENT: no sore throat, + see HPI Cardiovascular: no CP/no SOB/no palpitations/no leg swelling Respiratory: no cough/no SOB/no wheezing Gastrointestinal: + N (dry heaving) occasionally/no V/no D/no C/no acid reflux Musculoskeletal: no muscle aches/+ joint aches - R shoulder Skin: no rashes, + hair loss Neurological: no tremors/no numbness/no tingling/no dizziness  I reviewed pt's medications, allergies, PMH, social hx, family hx, and changes were documented in the history of present illness. Otherwise, unchanged from my initial visit note.  PMH: - h/o Graves' disease - Post ablative hypothyroidism - Anxiety /depression - Postmenopausal status   PMH: -No surgical history  Social History   Social History  . Marital Status: Married    Spouse Name: N/A  . Number of Children: 1   Occupational History  . N/a     Smoking status: Never Smoker   . Smokeless tobacco: No  . Alcohol Use: No  . Drug Use: No   Current Outpatient Medications  Medication Sig Dispense Refill  . Aspirin-Salicylamide-Caffeine (BC HEADACHE POWDER PO) Take by mouth as needed.    . clonazePAM (KLONOPIN) 1 MG tablet TAKE 1 TABLET BY MOUTH EVERY DAY WITH MEAL. OCCASIONALLY EXTRA PILL AS NEEDED IF ANXIETY INCREASES.  1  . fexofenadine (ALLEGRA) 180  MG tablet Take 90 mg by mouth 2 (two) times daily.    Marland Kitchen gabapentin (NEURONTIN) 300 MG capsule Take 300 mg by mouth 3 (three) times daily.    Marland Kitchen levothyroxine (SYNTHROID) 88 MCG tablet TAKE 1 TABLET BY MOUTH EVERY DAY BEFORE BREAKFAST 90 tablet 2  . meloxicam (MOBIC) 7.5 MG tablet Take 1 tablet (7.5 mg total) by mouth daily. 30 tablet 0  . methocarbamol (ROBAXIN) 500 MG tablet Take 1 tablet (500 mg total) by mouth 2  (two) times daily. 20 tablet 0  . rizatriptan (MAXALT) 5 MG tablet Take 5 mg by mouth as needed for migraine. May repeat in 2 hours if needed    . sertraline (ZOLOFT) 100 MG tablet Take 200 mg by mouth daily.  1  . tiZANidine (ZANAFLEX) 2 MG tablet Take 1 tablet (2 mg total) by mouth at bedtime. 30 tablet 0  . traMADol (ULTRAM) 50 MG tablet Take 1 tablet (50 mg total) by mouth every 6 (six) hours as needed. 15 tablet 0   No current facility-administered medications for this visit.   Allergies  Allergen Reactions  . Benadryl [Diphenhydramine Hcl] Other (See Comments)    Makes her skin crawl and she gets hyped up    Family history: - No thyroid family history  - No diabetes, hypertension, hyperlipidemia, cancer in family members   PE: LMP 08/16/2010  There is no height or weight on file to calculate BMI. Wt Readings from Last 3 Encounters:  03/17/20 134 lb (60.8 kg)  11/28/18 133 lb (60.3 kg)  01/10/18 142 lb (64.4 kg)   Constitutional: normal weight, in NAD Eyes: PERRLA, EOMI, no exophthalmos ENT: moist mucous membranes, no thyromegaly, no cervical lymphadenopathy Cardiovascular: RRR, No MRG Respiratory: CTA B Gastrointestinal: abdomen soft, NT, ND, BS+ Musculoskeletal: no deformities, strength intact in all 4 Skin: moist, warm, no rashes Neurological: no tremor with outstretched hands, DTR normal in all 4  ASSESSMENT: 1.  Post ablative Hypothyroidism  PLAN:  1. Patient with longstanding hypothyroidism developed after RAI ablation for Graves' disease, with uncontrolled TFTs over the years.  However, her tests improved after we switched to Synthroid d.a.w., but now back on LT4. - latest thyroid labs reviewed with pt >> normal: Lab Results  Component Value Date   TSH 2.50 11/28/2018   - she continues on LT4 88 mcg daily - pt feels good on this dose except she has occasional nausea (? Cause) - we discussed about taking the thyroid hormone every day, with water, >30 minutes  before breakfast, separated by >4 hours from acid reflux medications, calcium, iron, multivitamins. Pt. is taking it correctly. - will check thyroid tests today: TSH and fT4 - If labs are abnormal, she will need to return for repeat TFTs in 1.5 months - OTW, RTC in 1 year  Patient Instructions  Please stop at the lab.  Continue Levothyroxine 88 mcg daily.  Take the thyroid hormone every day, with water, at least 30 minutes before breakfast, separated by at least 4 hours from: - acid reflux medications - calcium - iron - multivitamins  Please return in 1 year.  Component     Latest Ref Rng & Units 04/05/2020  TSH     0.35 - 4.50 uIU/mL 0.41  T4,Free(Direct)     0.60 - 1.60 ng/dL 7.85 (H)  Normal TSH.  Free T4 is slightly high, most likely due to close proximity of the lab draw to her levothyroxine dosing.  Carlus Pavlov, MD PhD [facility] Endocrinology

## 2020-04-07 ENCOUNTER — Other Ambulatory Visit (HOSPITAL_COMMUNITY)
Admission: RE | Admit: 2020-04-07 | Discharge: 2020-04-07 | Disposition: A | Discharge status: Home / Self Care | Payer: Medicare Other | Source: Ambulatory Visit | Attending: Orthopedic Surgery | Admitting: Orthopedic Surgery

## 2020-04-07 DIAGNOSIS — Z20822 Contact with and (suspected) exposure to covid-19: Secondary | ICD-10-CM | POA: Insufficient documentation

## 2020-04-07 DIAGNOSIS — Z01812 Encounter for preprocedural laboratory examination: Secondary | ICD-10-CM | POA: Diagnosis present

## 2020-04-07 LAB — SARS CORONAVIRUS 2 (TAT 6-24 HRS): SARS Coronavirus 2: NEGATIVE

## 2020-04-07 NOTE — Progress Notes (Signed)

## 2020-04-08 ENCOUNTER — Other Ambulatory Visit: Payer: Self-pay | Admitting: Family Medicine

## 2020-04-11 ENCOUNTER — Ambulatory Visit (HOSPITAL_BASED_OUTPATIENT_CLINIC_OR_DEPARTMENT_OTHER): Payer: Medicare Other | Admitting: Anesthesiology

## 2020-04-11 ENCOUNTER — Ambulatory Visit (HOSPITAL_BASED_OUTPATIENT_CLINIC_OR_DEPARTMENT_OTHER)
Admission: RE | Admit: 2020-04-11 | Discharge: 2020-04-11 | Disposition: A | Discharge status: Home / Self Care | Payer: Medicare Other | Attending: Orthopedic Surgery | Admitting: Orthopedic Surgery

## 2020-04-11 ENCOUNTER — Encounter (HOSPITAL_BASED_OUTPATIENT_CLINIC_OR_DEPARTMENT_OTHER): Payer: Self-pay | Admitting: Orthopedic Surgery

## 2020-04-11 ENCOUNTER — Encounter (HOSPITAL_BASED_OUTPATIENT_CLINIC_OR_DEPARTMENT_OTHER)
Admission: RE | Disposition: A | Discharge status: Home / Self Care | Payer: Self-pay | Source: Home / Self Care | Attending: Orthopedic Surgery

## 2020-04-11 DIAGNOSIS — Z888 Allergy status to other drugs, medicaments and biological substances status: Secondary | ICD-10-CM | POA: Diagnosis not present

## 2020-04-11 DIAGNOSIS — Z7982 Long term (current) use of aspirin: Secondary | ICD-10-CM | POA: Insufficient documentation

## 2020-04-11 DIAGNOSIS — M75111 Incomplete rotator cuff tear or rupture of right shoulder, not specified as traumatic: Secondary | ICD-10-CM | POA: Insufficient documentation

## 2020-04-11 DIAGNOSIS — Z8249 Family history of ischemic heart disease and other diseases of the circulatory system: Secondary | ICD-10-CM | POA: Insufficient documentation

## 2020-04-11 DIAGNOSIS — M7541 Impingement syndrome of right shoulder: Secondary | ICD-10-CM | POA: Insufficient documentation

## 2020-04-11 DIAGNOSIS — Z79899 Other long term (current) drug therapy: Secondary | ICD-10-CM | POA: Insufficient documentation

## 2020-04-11 DIAGNOSIS — Z791 Long term (current) use of non-steroidal anti-inflammatories (NSAID): Secondary | ICD-10-CM | POA: Diagnosis not present

## 2020-04-11 HISTORY — PX: SHOULDER ARTHROSCOPY WITH SUBACROMIAL DECOMPRESSION: SHX5684

## 2020-04-11 SURGERY — SHOULDER ARTHROSCOPY WITH SUBACROMIAL DECOMPRESSION
Anesthesia: General | Site: Shoulder | Laterality: Right

## 2020-04-11 MED ORDER — CEFAZOLIN SODIUM-DEXTROSE 2-4 GM/100ML-% IV SOLN
2.0000 g | INTRAVENOUS | Status: AC
  Administered 2020-04-11: 2 g via INTRAVENOUS

## 2020-04-11 MED ORDER — ROCURONIUM BROMIDE 100 MG/10ML IV SOLN
INTRAVENOUS | Status: DC | PRN
  Administered 2020-04-11: 50 mg via INTRAVENOUS

## 2020-04-11 MED ORDER — METOCLOPRAMIDE HCL 5 MG/ML IJ SOLN
5.0000 mg | Freq: Once | INTRAMUSCULAR | Status: AC
  Administered 2020-04-11: 5 mg via INTRAVENOUS

## 2020-04-11 MED ORDER — DEXAMETHASONE SODIUM PHOSPHATE 10 MG/ML IJ SOLN
INTRAMUSCULAR | Status: AC
  Filled 2020-04-11: qty 1

## 2020-04-11 MED ORDER — ONDANSETRON HCL 4 MG/2ML IJ SOLN
INTRAMUSCULAR | Status: AC
  Filled 2020-04-11: qty 2

## 2020-04-11 MED ORDER — ONDANSETRON HCL 4 MG/2ML IJ SOLN
INTRAMUSCULAR | Status: DC | PRN
  Administered 2020-04-11: 4 mg via INTRAVENOUS

## 2020-04-11 MED ORDER — LACTATED RINGERS IV SOLN: INTRAVENOUS | Status: DC

## 2020-04-11 MED ORDER — HYDROMORPHONE HCL 1 MG/ML IJ SOLN: 0.2500 mg | INTRAMUSCULAR | Status: DC | PRN

## 2020-04-11 MED ORDER — ARTIFICIAL TEARS OPHTHALMIC OINT
TOPICAL_OINTMENT | OPHTHALMIC | Status: AC
  Filled 2020-04-11: qty 3.5

## 2020-04-11 MED ORDER — PHENYLEPHRINE HCL (PRESSORS) 10 MG/ML IV SOLN
INTRAVENOUS | Status: DC | PRN
  Administered 2020-04-11: 120 ug via INTRAVENOUS
  Administered 2020-04-11: 80 ug via INTRAVENOUS
  Administered 2020-04-11: 120 ug via INTRAVENOUS
  Administered 2020-04-11: 80 ug via INTRAVENOUS

## 2020-04-11 MED ORDER — METOCLOPRAMIDE HCL 5 MG/ML IJ SOLN
INTRAMUSCULAR | Status: AC
  Filled 2020-04-11: qty 2

## 2020-04-11 MED ORDER — FENTANYL CITRATE (PF) 100 MCG/2ML IJ SOLN
INTRAMUSCULAR | Status: AC
  Filled 2020-04-11: qty 2

## 2020-04-11 MED ORDER — FENTANYL CITRATE (PF) 100 MCG/2ML IJ SOLN
INTRAMUSCULAR | Status: DC | PRN
Start: 2020-04-11 — End: 2020-04-11
  Administered 2020-04-11: 100 ug via INTRAVENOUS

## 2020-04-11 MED ORDER — MIDAZOLAM HCL 2 MG/2ML IJ SOLN
INTRAMUSCULAR | Status: DC | PRN
  Administered 2020-04-11: 2 mg via INTRAVENOUS

## 2020-04-11 MED ORDER — OXYCODONE-ACETAMINOPHEN 5-325 MG PO TABS: ORAL_TABLET | ORAL | 0 refills | Status: DC

## 2020-04-11 MED ORDER — FENTANYL CITRATE (PF) 100 MCG/2ML IJ SOLN
100.0000 ug | Freq: Once | INTRAMUSCULAR | Status: AC
  Administered 2020-04-11: 100 ug via INTRAVENOUS

## 2020-04-11 MED ORDER — PROPOFOL 10 MG/ML IV BOLUS
INTRAVENOUS | Status: AC
  Filled 2020-04-11: qty 20

## 2020-04-11 MED ORDER — PROPOFOL 10 MG/ML IV BOLUS
INTRAVENOUS | Status: DC | PRN
  Administered 2020-04-11: 160 mg via INTRAVENOUS

## 2020-04-11 MED ORDER — MIDAZOLAM HCL 2 MG/2ML IJ SOLN
2.0000 mg | Freq: Once | INTRAMUSCULAR | Status: AC
  Administered 2020-04-11: 2 mg via INTRAVENOUS

## 2020-04-11 MED ORDER — MIDAZOLAM HCL 2 MG/2ML IJ SOLN
INTRAMUSCULAR | Status: AC
  Filled 2020-04-11: qty 2

## 2020-04-11 MED ORDER — LIDOCAINE 2% (20 MG/ML) 5 ML SYRINGE
INTRAMUSCULAR | Status: AC
  Filled 2020-04-11: qty 5

## 2020-04-11 MED ORDER — CEFAZOLIN SODIUM-DEXTROSE 2-4 GM/100ML-% IV SOLN
INTRAVENOUS | Status: AC
  Filled 2020-04-11: qty 100

## 2020-04-11 MED ORDER — PHENYLEPHRINE 40 MCG/ML (10ML) SYRINGE FOR IV PUSH (FOR BLOOD PRESSURE SUPPORT)
PREFILLED_SYRINGE | INTRAVENOUS | Status: AC
  Filled 2020-04-11: qty 10

## 2020-04-11 MED ORDER — ARTIFICIAL TEARS OPHTHALMIC OINT
TOPICAL_OINTMENT | OPHTHALMIC | Status: DC | PRN
  Administered 2020-04-11: 1 via OPHTHALMIC

## 2020-04-11 MED ORDER — GLYCOPYRROLATE 0.2 MG/ML IJ SOLN
INTRAMUSCULAR | Status: AC
  Filled 2020-04-11: qty 1

## 2020-04-11 MED ORDER — SUGAMMADEX SODIUM 500 MG/5ML IV SOLN
INTRAVENOUS | Status: DC | PRN
  Administered 2020-04-11: 200 mg via INTRAVENOUS

## 2020-04-11 MED ORDER — SODIUM CHLORIDE 0.9 % IR SOLN
Status: DC | PRN
  Administered 2020-04-11: 3000 mL

## 2020-04-11 MED ORDER — GLYCOPYRROLATE 0.2 MG/ML IJ SOLN
INTRAMUSCULAR | Status: DC | PRN
  Administered 2020-04-11: .2 mg via INTRAVENOUS

## 2020-04-11 SURGICAL SUPPLY — 57 items
AID PSTN UNV HD RSTRNT DISP (MISCELLANEOUS) ×2
APL PRP STRL LF DISP 70% ISPRP (MISCELLANEOUS) ×2
APL SKNCLS STERI-STRIP NONHPOA (GAUZE/BANDAGES/DRESSINGS)
BENZOIN TINCTURE PRP APPL 2/3 (GAUZE/BANDAGES/DRESSINGS) IMPLANT
BURR OVAL 8 FLU 4.0X13 (MISCELLANEOUS) ×3 IMPLANT
CANNULA 5.75X7 CRYSTAL CLEAR (CANNULA) ×3 IMPLANT
CANNULA TWIST IN 8.25X7CM (CANNULA) IMPLANT
CHLORAPREP W/TINT 26 (MISCELLANEOUS) ×3 IMPLANT
COVER WAND RF STERILE (DRAPES) IMPLANT
CUTTER BONE 4.0MM X 13CM (MISCELLANEOUS) ×3 IMPLANT
DECANTER SPIKE VIAL GLASS SM (MISCELLANEOUS) IMPLANT
DRAPE IMP U-DRAPE 54X76 (DRAPES) ×3 IMPLANT
DRAPE INCISE IOBAN 66X45 STRL (DRAPES) ×3 IMPLANT
DRAPE STERI 35X30 U-POUCH (DRAPES) ×3 IMPLANT
DRAPE SURG 17X23 STRL (DRAPES) ×3 IMPLANT
DRAPE U-SHAPE 47X51 STRL (DRAPES) ×3 IMPLANT
DRAPE U-SHAPE 76X120 STRL (DRAPES) ×6 IMPLANT
DRSG PAD ABDOMINAL 8X10 ST (GAUZE/BANDAGES/DRESSINGS) ×3 IMPLANT
ELECT REM PT RETURN 9FT ADLT (ELECTROSURGICAL) ×3
ELECTRODE REM PT RTRN 9FT ADLT (ELECTROSURGICAL) ×2 IMPLANT
GAUZE 4X4 16PLY RFD (DISPOSABLE) IMPLANT
GAUZE SPONGE 4X4 12PLY STRL (GAUZE/BANDAGES/DRESSINGS) ×3 IMPLANT
GAUZE XEROFORM 1X8 LF (GAUZE/BANDAGES/DRESSINGS) ×3 IMPLANT
GLOVE BIO SURGEON STRL SZ7.5 (GLOVE) ×3 IMPLANT
GLOVE BIOGEL PI IND STRL 7.0 (GLOVE) ×2 IMPLANT
GLOVE BIOGEL PI INDICATOR 7.0 (GLOVE) ×1
GLOVE SRG 8 PF TXTR STRL LF DI (GLOVE) ×2 IMPLANT
GLOVE SURG ENC MOIS LTX SZ7 (GLOVE) ×3 IMPLANT
GLOVE SURG UNDER POLY LF SZ8 (GLOVE) ×3
GOWN STRL REUS W/ TWL LRG LVL3 (GOWN DISPOSABLE) ×4 IMPLANT
GOWN STRL REUS W/ TWL XL LVL3 (GOWN DISPOSABLE) ×2 IMPLANT
GOWN STRL REUS W/TWL LRG LVL3 (GOWN DISPOSABLE) ×6
GOWN STRL REUS W/TWL XL LVL3 (GOWN DISPOSABLE) ×3
LASSO CRESCENT QUICKPASS (SUTURE) IMPLANT
MANIFOLD NEPTUNE II (INSTRUMENTS) ×3 IMPLANT
NEEDLE SCORPION MULTI FIRE (NEEDLE) IMPLANT
NS IRRIG 1000ML POUR BTL (IV SOLUTION) IMPLANT
PACK ARTHROSCOPY DSU (CUSTOM PROCEDURE TRAY) ×3 IMPLANT
PACK BASIN DAY SURGERY FS (CUSTOM PROCEDURE TRAY) ×3 IMPLANT
PROBE BIPOLAR ATHRO 135MM 90D (MISCELLANEOUS) ×3 IMPLANT
RESTRAINT HEAD UNIVERSAL NS (MISCELLANEOUS) ×3 IMPLANT
SLEEVE SCD COMPRESS KNEE MED (MISCELLANEOUS) ×3 IMPLANT
SLING ARM FOAM STRAP LRG (SOFTGOODS) ×3 IMPLANT
STRIP CLOSURE SKIN 1/2X4 (GAUZE/BANDAGES/DRESSINGS) IMPLANT
SUPPORT WRAP ARM LG (MISCELLANEOUS) IMPLANT
SUT ETHILON 3 0 PS 1 (SUTURE) ×3 IMPLANT
SUT FIBERWIRE #2 38 T-5 BLUE (SUTURE)
SUT PDS AB 0 CT 36 (SUTURE) IMPLANT
SUT TIGER TAPE 7 IN WHITE (SUTURE) IMPLANT
SUTURE FIBERWR #2 38 T-5 BLUE (SUTURE) IMPLANT
SUTURE TAPE 1.3 40 TPR END (SUTURE) IMPLANT
SUTURETAPE 1.3 40 TPR END (SUTURE)
TAPE FIBER 2MM 7IN #2 BLUE (SUTURE) IMPLANT
TOWEL GREEN STERILE FF (TOWEL DISPOSABLE) ×3 IMPLANT
TUBE CONNECTING 20X1/4 (TUBING) ×3 IMPLANT
TUBING ARTHROSCOPY IRRIG 16FT (MISCELLANEOUS) ×3 IMPLANT
WATER STERILE IRR 1000ML POUR (IV SOLUTION) ×3 IMPLANT

## 2020-04-11 NOTE — Anesthesia Procedure Notes (Signed)
Procedure Name: Intubation Date/Time: 04/11/2020 12:50 PM Performed by: Collier Bullock, CRNA Pre-anesthesia Checklist: Patient identified, Emergency Drugs available, Suction available and Patient being monitored Patient Re-evaluated:Patient Re-evaluated prior to induction Oxygen Delivery Method: Circle system utilized Preoxygenation: Pre-oxygenation with 100% oxygen Induction Type: IV induction Ventilation: Mask ventilation without difficulty and Oral airway inserted - appropriate to patient size Laryngoscope Size: Mac, 4, Miller and 2 (DL x1 by CRNA, then by MDA with miller 2) Tube type: Oral Tube size: 7.0 mm Number of attempts: 2 (DLx2 first by CRNA, then by MDA) Airway Equipment and Method: Stylet Placement Confirmation: ETT inserted through vocal cords under direct vision,  positive ETCO2 and breath sounds checked- equal and bilateral Secured at: 22 cm Tube secured with: Tape Dental Injury: Injury to lip  Difficulty Due To: Difficulty was unanticipated

## 2020-04-11 NOTE — Anesthesia Procedure Notes (Signed)
Anesthesia Regional Block: Interscalene brachial plexus block   Pre-Anesthetic Checklist: ,, timeout performed, Correct Patient, Correct Site, Correct Laterality, Correct Procedure, Correct Position, site marked, Risks and benefits discussed,  Surgical consent,  Pre-op evaluation,  At surgeon's request and post-op pain management  Laterality: Right  Prep: Maximum Sterile Barrier Precautions used, chloraprep       Needles:  Injection technique: Single-shot  Needle Type: Echogenic Stimulator Needle          Additional Needles:   Procedures: Doppler guided, nerve stimulator,,, ultrasound used (permanent image in chart),,,,   Nerve Stimulator or Paresthesia:  Response: 0.5 mA,   Additional Responses:   Narrative:  Start time: 04/11/2020 11:30 AM End time: 04/11/2020 11:45 AM Injection made incrementally with aspirations every 5 mL.  Performed by: Personally  Anesthesiologist: Dorris Singh, MD

## 2020-04-11 NOTE — Anesthesia Postprocedure Evaluation (Signed)
Anesthesia Post Note  Patient: Kristen [name]  Procedure(s) Performed: SHOULDER ARTHROSCOPY WITH SUBACROMIAL DECOMPRESSION (Right Shoulder)     Patient location during evaluation: PACU Anesthesia Type: General Level of consciousness: awake Pain management: pain level controlled Vital Signs Assessment: post-procedure vital signs reviewed and stable Respiratory status: spontaneous breathing Cardiovascular status: stable Postop Assessment: no apparent nausea or vomiting Anesthetic complications: no   No complications documented.  Last Vitals:  Vitals:   04/11/20 1400 04/11/20 1405  BP: 131/85 117/67  Pulse: 91 87  Resp: 15 20  Temp:    SpO2: 98% 96%    Last Pain:  Vitals:   04/11/20 1415  TempSrc:   PainSc: 0-No pain                 Quinnten Calvin

## 2020-04-11 NOTE — H&P (Signed)
Kristen [name] is an 60 y.o. female.   Chief Complaint: R shoulder pain HPI: R shoulder high grade partial rotator cuff tear, failed conservative treatment.  Past Medical History:  Diagnosis Date  . Anxiety   . Depression   . Hair loss   . Insomnia   . Migraine   . Post-surgical hypothyroidism    ?cancer    Past Surgical History:  Procedure Laterality Date  . EYE SURGERY      Family History  Problem Relation Age of Onset  . Hypertension Father   . Heart disease Father   . Healthy Mother    Social History:  reports that she has never smoked. She has never used smokeless tobacco. She reports current alcohol use. She reports that she does not use drugs.  Allergies:  Allergies  Allergen Reactions  . Benadryl [Diphenhydramine Hcl] Other (See Comments)    Makes her skin crawl and she gets hyped up     Medications Prior to Admission  Medication Sig Dispense Refill  . Aspirin-Salicylamide-Caffeine (BC HEADACHE POWDER PO) Take by mouth as needed.    . fexofenadine (ALLEGRA) 180 MG tablet Take 90 mg by mouth 2 (two) times daily.    Marland Kitchen gabapentin (NEURONTIN) 300 MG capsule Take 300 mg by mouth 3 (three) times daily.    Marland Kitchen levothyroxine (SYNTHROID) 88 MCG tablet TAKE 1 TABLET BY MOUTH EVERY DAY BEFORE BREAKFAST 90 tablet 2  . sertraline (ZOLOFT) 100 MG tablet Take 200 mg by mouth daily.  1  . traMADol (ULTRAM) 50 MG tablet Take 1 tablet (50 mg total) by mouth every 6 (six) hours as needed. 15 tablet 0  . clonazePAM (KLONOPIN) 1 MG tablet TAKE 1 TABLET BY MOUTH EVERY DAY WITH MEAL. OCCASIONALLY EXTRA PILL AS NEEDED IF ANXIETY INCREASES.  1  . meloxicam (MOBIC) 7.5 MG tablet Take 1 tablet (7.5 mg total) by mouth daily. 30 tablet 0  . methocarbamol (ROBAXIN) 500 MG tablet Take 1 tablet (500 mg total) by mouth 2 (two) times daily. 20 tablet 0  . rizatriptan (MAXALT) 5 MG tablet Take 5 mg by mouth as needed for migraine. May repeat in 2 hours if needed    . tiZANidine (ZANAFLEX) 2 MG  tablet Take 1 tablet (2 mg total) by mouth at bedtime. 30 tablet 0    No results found for this or any previous visit (from the past 48 hour(s)). No results found.  Review of Systems  Blood pressure 125/72, pulse 82, temperature 98.5 F (36.9 C), temperature source Oral, resp. rate 15, height 5\' 3"  (1.6 m), weight 59.4 kg, last menstrual period 08/16/2010, SpO2 98 %. Physical Exam   Assessment/Plan R shoulder high grade partial rotator cuff tear, failed conservative treatment. Plan R arth RCR, SAD Risks / benefits of surgery discussed Consent on chart  NPO for OR Preop antibiotics   10/16/2010, MD 04/11/2020, 12:22 PM

## 2020-04-11 NOTE — Progress Notes (Signed)
Assisted Dr. Green with right, ultrasound guided, interscalene  block. Side rails up, monitors on throughout procedure. See vital signs in flow sheet. Tolerated Procedure well.  

## 2020-04-11 NOTE — Transfer of Care (Signed)
Immediate Anesthesia Transfer of Care Note  Patient: Kristen [name]  Procedure(s) Performed: SHOULDER ARTHROSCOPY WITH SUBACROMIAL DECOMPRESSION (Right Shoulder)  Patient Location: PACU  Anesthesia Type:General and Regional  Level of Consciousness: awake and patient cooperative  Airway & Oxygen Therapy: Patient Spontanous Breathing and Patient connected to face mask  Post-op Assessment: Report given to RN, Post -op Vital signs reviewed and stable, Patient moving all extremities X 4 and Patient able to stick tongue midline  Post vital signs: Reviewed and stable  Last Vitals:  Vitals Value Taken Time  BP    Temp    Pulse    Resp    SpO2      Last Pain:  Vitals:   04/11/20 1032  TempSrc: Oral  PainSc: 10-Worst pain ever         Complications: No complications documented.

## 2020-04-11 NOTE — Op Note (Signed)
Procedure(s):   Kristen [name] female 60 y.o. 04/11/2020   Preoperative diagnosis:  #1 right shoulder partial-thickness rotator cuff tear #2 right shoulder impingement  Postoperative diagnosis: Same, right shoulder superior labral tear  Procedure(s) and Anesthesia Type: #1 right shoulder arthroscopic debridement partial-thickness rotator cuff tear, superior labral tear #2 right shoulder arthroscopic subacromial decompression  Surgeon(s) and Role:    Tania Ade, MD - Primary     Surgeon: Isabella Stalling   Assistants: Jeanmarie Hubert PA-C (Danielle was present and scrubbed throughout the procedure and was essential in positioning, assisting with the camera and instrumentation,, and closure)  Anesthesia: General endotracheal anesthesia with preoperative interscalene block given by the attending anesthesiologist     Procedure Detail   Estimated Blood Loss: Min         Drains: none  Blood Given: none         Specimens: none        Complications:  * No complications entered in OR log *         Disposition: PACU - hemodynamically stable.         Condition: stable    Procedure:   INDICATIONS FOR SURGERY: The patient is 60 y.o. female who has had a long history of right shoulder pain which has failed conservative management. MRI was concerning for high-grade partial rotator cuff tear. Indicated for surgical treatment to try and decrease pain and restore function.  OPERATIVE FINDINGS: Examination under anesthesia: No stiffness or instability   DESCRIPTION OF PROCEDURE: The patient was identified in preoperative  holding area where I personally marked the operative site after  verifying site, side, and procedure with the patient. An interscalene block was given by the attending anesthesiologist the holding area.  The patient was taken back to the operating room where general anesthesia was induced without complication and was placed in the beach-chair  position with the back  elevated about 60 degrees and all extremities and head and neck carefully padded and  positioned.   The right upper extremity was then prepped and  draped in a standard sterile fashion. The appropriate time-out  procedure was carried out. The patient did receive IV antibiotics  within 30 minutes of incision.   A small posterior portal incision was made and the arthroscope was introduced into the joint. An anterior portal was then established above the subscapularis using needle localization. Small cannula was placed anteriorly. Diagnostic arthroscopy was then carried out   She was noted to have a large flap tear of the superior labrum which was hanging down over about 50% of the glenoid surface. There was some surrounding synovitis. This was extensively debrided back to healthy labrum. The biceps anchor was not compromised. The biceps itself looked healthy. Glenohumeral joint surfaces were intact without significant chondromalacia. The subscapularis was intact and healthy-appearing the undersurface of the supraspinatus and infraspinatus were also completely intact without significant undersurface tearing.  The arthroscope was then introduced into the subacromial space a standard lateral portal was established with needle localization. The shaver was used through the lateral portal to perform extensive bursectomy. Coracoacromial ligament was examined and found to be frayed indicating chronic impingement.  The bursal surface of the rotator cuff was noted to have some partial tearing involving about 10% of the superficial surface. This was debrided back to healthy tendon. There was no exposed tuberosity. No formal repair was felt to be necessary.  The coracoacromial ligament was taken down off the anterior acromion with the ArthroCare exposing  a moderate hooked anterior acromial spur. A high-speed bur was then used through the lateral portal to take down the anterior acromial  spur from lateral to medial in a standard acromioplasty.  The acromioplasty was also viewed from the lateral portal and the bur was used as necessary to ensure that the acromion was completely flat from posterior to anterior.   The arthroscopic equipment was removed from the joint and the portals were closed with 3-0 nylon in an interrupted fashion. Sterile dressings were then applied including Xeroform 4 x 4's ABDs and tape. The patient was then allowed to awaken from general anesthesia, placed in a sling, transferred to the stretcher and taken to the recovery room in stable condition.   POSTOPERATIVE PLAN: The patient will be discharged home today and will followup in one week for suture removal and wound check.

## 2020-04-11 NOTE — Discharge Instructions (Signed)
Discharge Instructions after Arthroscopic Shoulder Surgery   A sling has been provided for you. You may remove the sling after 72 hours. The sling may be worn for your protection, if you are in a crowd.  Use ice on the shoulder intermittently over the first 48 hours after surgery.  Pain medication has been prescribed for you.  Use your medication liberally over the first 48 hours, and then begin to taper your use. You may take Extra Strength Tylenol or Tylenol only in place of the pain pills. DO NOT take ANY nonsteroidal anti-inflammatory pain medications: Advil, Motrin, Ibuprofen, Aleve, Naproxen, or Naprosyn.  You may remove your dressing after two days.  You may shower 5 days after surgery. The incision CANNOT get wet prior to 5 days. Simply allow the water to wash over the site and then pat dry. Do not rub the incision. Make sure your axilla (armpit) is completely dry after showering.  Take one aspirin a day for 2 weeks after surgery, unless you have an aspirin sensitivity/allergy or asthma.  Three to 5 times each day you should perform assisted overhead reaching and external rotation (outward turning) exercises with the operative arm. Both exercises should be done with the non-operative arm used as the "therapist arm" while the operative arm remains relaxed. Ten of each exercise should be done three to five times each day.    Overhead reach is helping to lift your stiff arm up as high as it will go. To stretch your overhead reach, lie flat on your back, relax, and grasp the wrist of the tight shoulder with your opposite hand. Using the power in your opposite arm, bring the stiff arm up as far as it is comfortable. Start holding it for ten seconds and then work up to where you can hold it for a count of 30. Breathe slowly and deeply while the arm is moved. Repeat this stretch ten times, trying to help the arm up a little higher each time.       External rotation is turning the arm out to  the side while your elbow stays close to your body. External rotation is best stretched while you are lying on your back. Hold a cane, yardstick, broom handle, or dowel in both hands. Bend both elbows to a right angle. Use steady, gentle force from your normal arm to rotate the hand of the stiff shoulder out away from your body. Continue the rotation as far as it will go comfortably, holding it there for a count of 10. Repeat this exercise ten times.     Please call [phone] during normal business hours or [phone] after hours for any problems. Including the following:  - excessive redness of the incisions - drainage for more than 4 days - fever of more than 101.5 F  *Please note that pain medications will not be refilled after hours or on weekends.    Post Anesthesia Home Care Instructions  Activity: Get plenty of rest for the remainder of the day. A responsible individual must stay with you for 24 hours following the procedure.  For the next 24 hours, DO NOT: -Drive a car -Operate machinery -Drink alcoholic beverages -Take any medication unless instructed by your physician -Make any legal decisions or sign important papers.  Meals: Start with liquid foods such as gelatin or soup. Progress to regular foods as tolerated. Avoid greasy, spicy, heavy foods. If nausea and/or vomiting occur, drink only clear liquids until the nausea and/or vomiting subsides. Call   your physician if vomiting continues.  Special Instructions/Symptoms: Your throat may feel dry or sore from the anesthesia or the breathing tube placed in your throat during surgery. If this causes discomfort, gargle with warm salt water. The discomfort should disappear within 24 hours.  If you had a scopolamine patch placed behind your ear for the management of post- operative nausea and/or vomiting:  1. The medication in the patch is effective for 72 hours, after which it should be removed.  Wrap patch in a tissue and  discard in the trash. Wash hands thoroughly with soap and water. 2. You may remove the patch earlier than 72 hours if you experience unpleasant side effects which may include dry mouth, dizziness or visual disturbances. 3. Avoid touching the patch. Wash your hands with soap and water after contact with the patch.    Regional Anesthesia Blocks  1. Numbness or the inability to move the "blocked" extremity may last from 3-48 hours after placement. The length of time depends on the medication injected and your individual response to the medication. If the numbness is not going away after 48 hours, call your surgeon.  2. The extremity that is blocked will need to be protected until the numbness is gone and the  Strength has returned. Because you cannot feel it, you will need to take extra care to avoid injury. Because it may be weak, you may have difficulty moving it or using it. You may not know what position it is in without looking at it while the block is in effect.  3. For blocks in the legs and feet, returning to weight bearing and walking needs to be done carefully. You will need to wait until the numbness is entirely gone and the strength has returned. You should be able to move your leg and foot normally before you try and bear weight or walk. You will need someone to be with you when you first try to ensure you do not fall and possibly risk injury.  4. Bruising and tenderness at the needle site are common side effects and will resolve in a few days.  5. Persistent numbness or new problems with movement should be communicated to the surgeon or the [facility] ([phone])/ [facility] ([phone]). 

## 2020-04-11 NOTE — Anesthesia Preprocedure Evaluation (Addendum)
Anesthesia Evaluation  Patient identified by MRN, date of birth, ID band Patient awake    Reviewed: Allergy & Precautions, NPO status , Patient's Chart, lab work & pertinent test results  Airway Mallampati: II  TM Distance: >3 FB     Dental   Pulmonary    breath sounds clear to auscultation       Cardiovascular negative cardio ROS   Rhythm:Regular Rate:Normal     Neuro/Psych  Headaches, Anxiety Depression    GI/Hepatic negative GI ROS, Neg liver ROS,   Endo/Other  negative endocrine ROSHypothyroidism   Renal/GU negative Renal ROS     Musculoskeletal   Abdominal   Peds  Hematology   Anesthesia Other Findings   Reproductive/Obstetrics                            Anesthesia Physical Anesthesia Plan  ASA: III  Anesthesia Plan: General   Post-op Pain Management:  Regional for Post-op pain   Induction: Intravenous  PONV Risk Score and Plan: 3 and Ondansetron, Dexamethasone and Midazolam  Airway Management Planned: Oral ETT  Additional Equipment:   Intra-op Plan:   Post-operative Plan: Extubation in OR  Informed Consent: I have reviewed the patients History and Physical, chart, labs and discussed the procedure including the risks, benefits and alternatives for the proposed anesthesia with the patient or authorized representative who has indicated his/her understanding and acceptance.     Dental advisory given  Plan Discussed with: CRNA and Anesthesiologist  Anesthesia Plan Comments:        Anesthesia Quick Evaluation

## 2020-04-12 ENCOUNTER — Encounter (HOSPITAL_BASED_OUTPATIENT_CLINIC_OR_DEPARTMENT_OTHER): Payer: Self-pay | Admitting: Orthopedic Surgery

## 2020-04-12 NOTE — Addendum Note (Signed)
Addendum  created 04/12/20 3817 by Shiana Rappleye, Jewel Baize, CRNA   Charge Capture section accepted

## 2020-05-03 ENCOUNTER — Encounter (HOSPITAL_COMMUNITY): Payer: Self-pay | Admitting: Emergency Medicine

## 2020-05-03 ENCOUNTER — Other Ambulatory Visit: Payer: Self-pay

## 2020-05-03 ENCOUNTER — Emergency Department (HOSPITAL_COMMUNITY)
Admission: EM | Admit: 2020-05-03 | Discharge: 2020-05-03 | Disposition: A | Discharge status: Home / Self Care | Payer: Medicare Other | Attending: Emergency Medicine | Admitting: Emergency Medicine

## 2020-05-03 DIAGNOSIS — R109 Unspecified abdominal pain: Secondary | ICD-10-CM | POA: Insufficient documentation

## 2020-05-03 DIAGNOSIS — R197 Diarrhea, unspecified: Secondary | ICD-10-CM | POA: Insufficient documentation

## 2020-05-03 DIAGNOSIS — R112 Nausea with vomiting, unspecified: Secondary | ICD-10-CM | POA: Insufficient documentation

## 2020-05-03 DIAGNOSIS — Z5321 Procedure and treatment not carried out due to patient leaving prior to being seen by health care provider: Secondary | ICD-10-CM | POA: Insufficient documentation

## 2020-05-03 LAB — I-STAT BETA HCG BLOOD, ED (MC, WL, AP ONLY): I-stat hCG, quantitative: 6.5 m[IU]/mL — ABNORMAL HIGH (ref <5)

## 2020-05-03 LAB — COMPREHENSIVE METABOLIC PANEL
ALT: 10 U/L (ref 0–44)
AST: 13 U/L — ABNORMAL LOW (ref 15–41)
Albumin: 4.4 g/dL (ref 3.5–5.0)
Alkaline Phosphatase: 65 U/L (ref 38–126)
Anion gap: 13 (ref 5–15)
BUN: 10 mg/dL (ref 6–20)
CO2: 18 mmol/L — ABNORMAL LOW (ref 22–32)
Calcium: 9.4 mg/dL (ref 8.9–10.3)
Chloride: 106 mmol/L (ref 98–111)
Creatinine, Ser: 0.79 mg/dL (ref 0.44–1.00)
GFR, Estimated: >60 mL/min (ref >60)
Glucose, Bld: 96 mg/dL (ref 70–99)
Potassium: 3.8 mmol/L (ref 3.5–5.1)
Sodium: 137 mmol/L (ref 135–145)
Total Bilirubin: 1.3 mg/dL — ABNORMAL HIGH (ref 0.3–1.2)
Total Protein: 6.9 g/dL (ref 6.5–8.1)

## 2020-05-03 LAB — CBC
HCT: 44.3 % (ref 36.0–46.0)
Hemoglobin: 14.8 g/dL (ref 12.0–15.0)
MCH: 30.6 pg (ref 26.0–34.0)
MCHC: 33.4 g/dL (ref 30.0–36.0)
MCV: 91.7 fL (ref 80.0–100.0)
Platelets: 356 10*3/uL (ref 150–400)
RBC: 4.83 MIL/uL (ref 3.87–5.11)
RDW: 13.2 % (ref 11.5–15.5)
WBC: 6.9 10*3/uL (ref 4.0–10.5)
nRBC: 0 % (ref 0.0–0.2)

## 2020-05-03 LAB — LIPASE, BLOOD: Lipase: 35 U/L (ref 11–51)

## 2020-05-03 NOTE — ED Triage Notes (Signed)
Pt reports R rotator cuff surgery on 1/3, states that about a week or so after surgery she began having stomach pain and pain in her esophagus when she tries to eat. Also endorses n/v/d. States she had been on gabapentin x4 months, and after surgery she stopped it abruptly. Has had CT of neck that was negative, seen at PCP and referred to GI for further work up. Denies fever, chills, body aches.

## 2020-05-03 NOTE — ED Notes (Signed)
Pt left without being seen.

## 2020-05-07 ENCOUNTER — Other Ambulatory Visit: Payer: Self-pay | Admitting: Family Medicine

## 2020-05-09 ENCOUNTER — Other Ambulatory Visit (INDEPENDENT_AMBULATORY_CARE_PROVIDER_SITE_OTHER): Payer: Medicare Other

## 2020-05-09 ENCOUNTER — Other Ambulatory Visit: Payer: Self-pay

## 2020-05-09 DIAGNOSIS — E890 Postprocedural hypothyroidism: Secondary | ICD-10-CM | POA: Diagnosis not present

## 2020-05-09 LAB — TSH: TSH: 3.17 u[IU]/mL (ref 0.35–4.50)

## 2020-05-09 LAB — T4, FREE: Free T4: 1.26 ng/dL (ref 0.60–1.60)

## 2020-05-09 NOTE — Telephone Encounter (Signed)
Refill done.  

## 2020-05-09 NOTE — Progress Notes (Signed)
Orders

## 2020-05-17 DIAGNOSIS — H93233 Hyperacusis, bilateral: Secondary | ICD-10-CM | POA: Insufficient documentation

## 2020-05-17 DIAGNOSIS — K219 Gastro-esophageal reflux disease without esophagitis: Secondary | ICD-10-CM | POA: Insufficient documentation

## 2020-05-19 ENCOUNTER — Other Ambulatory Visit: Payer: Self-pay | Admitting: Physician Assistant

## 2020-05-19 DIAGNOSIS — R1310 Dysphagia, unspecified: Secondary | ICD-10-CM

## 2020-05-20 ENCOUNTER — Telehealth: Payer: Self-pay | Admitting: Family Medicine

## 2020-05-20 ENCOUNTER — Ambulatory Visit
Admission: RE | Admit: 2020-05-20 | Discharge: 2020-05-20 | Disposition: A | Discharge status: Home / Self Care | Payer: Medicare Other | Source: Ambulatory Visit | Attending: Physician Assistant | Admitting: Physician Assistant

## 2020-05-20 DIAGNOSIS — R1310 Dysphagia, unspecified: Secondary | ICD-10-CM

## 2020-05-20 NOTE — Telephone Encounter (Signed)
Patient called stating that she is experiencing a lot of pain in her neck.  She discussed it with Dr Tamala Julian the last time she was seen and her mentioned something to her about trying some injections. She asked if someone could call her to discuss this.  Please advise.

## 2020-05-22 ENCOUNTER — Other Ambulatory Visit: Payer: Self-pay | Admitting: Internal Medicine

## 2020-05-23 NOTE — Telephone Encounter (Signed)
With her only 6 weeks out from surgery I would see how she does for a total of 12 weeks before we consider any injecitons, I think she will do well but it can take time

## 2020-05-23 NOTE — Telephone Encounter (Signed)
Attempted to call patient but was unable to leave voicemail. 

## 2020-05-24 NOTE — Telephone Encounter (Signed)
Called patient and left message to call back to let her know that Dr. Tamala Julian will not do injections until 12 weeks after surgery.

## 2020-05-31 ENCOUNTER — Other Ambulatory Visit: Payer: Self-pay | Admitting: Family Medicine

## 2020-06-21 ENCOUNTER — Encounter: Payer: Self-pay | Admitting: Family Medicine

## 2020-06-21 ENCOUNTER — Ambulatory Visit (INDEPENDENT_AMBULATORY_CARE_PROVIDER_SITE_OTHER): Payer: Medicare Other | Admitting: Family Medicine

## 2020-06-21 ENCOUNTER — Other Ambulatory Visit: Payer: Self-pay

## 2020-06-21 ENCOUNTER — Ambulatory Visit (INDEPENDENT_AMBULATORY_CARE_PROVIDER_SITE_OTHER): Payer: Medicare Other

## 2020-06-21 VITALS — BP 130/84 | HR 77 | Ht 63.0 in | Wt 128.0 lb

## 2020-06-21 DIAGNOSIS — M542 Cervicalgia: Secondary | ICD-10-CM

## 2020-06-21 DIAGNOSIS — M75111 Incomplete rotator cuff tear or rupture of right shoulder, not specified as traumatic: Secondary | ICD-10-CM | POA: Diagnosis not present

## 2020-06-21 DIAGNOSIS — G43909 Migraine, unspecified, not intractable, without status migrainosus: Secondary | ICD-10-CM | POA: Insufficient documentation

## 2020-06-21 DIAGNOSIS — H93233 Hyperacusis, bilateral: Secondary | ICD-10-CM | POA: Diagnosis not present

## 2020-06-21 DIAGNOSIS — F4310 Post-traumatic stress disorder, unspecified: Secondary | ICD-10-CM | POA: Insufficient documentation

## 2020-06-21 DIAGNOSIS — J309 Allergic rhinitis, unspecified: Secondary | ICD-10-CM | POA: Insufficient documentation

## 2020-06-21 DIAGNOSIS — F419 Anxiety disorder, unspecified: Secondary | ICD-10-CM | POA: Insufficient documentation

## 2020-06-21 DIAGNOSIS — G4700 Insomnia, unspecified: Secondary | ICD-10-CM | POA: Insufficient documentation

## 2020-06-21 DIAGNOSIS — M546 Pain in thoracic spine: Secondary | ICD-10-CM

## 2020-06-21 DIAGNOSIS — F329 Major depressive disorder, single episode, unspecified: Secondary | ICD-10-CM | POA: Insufficient documentation

## 2020-06-21 NOTE — Progress Notes (Signed)
[facility] Phone: [phone] Subjective:   Fontaine No, am serving as a scribe for Dr. Hulan Saas. This visit occurred during the SARS-CoV-2 public health emergency.  Safety protocols were in place, including screening questions prior to the visit, additional usage of staff PPE, and extensive cleaning of exam room while observing appropriate contact time as indicated for disinfecting solutions.   I'm seeing this patient by the request  of:  [name], MD  CC: Neck and shoulder pain follow-up  [id number]   03/17/2020 Patient does have a tear of the right rotator cuff.  Patient has done physical therapy x2, 2 intra-articular injections and has had no improvement.  All of them seem to be low-grade articular partial tears of the subscapularis, supraspinatus and infraspinatus.  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  Discussed with patient in great length.  We discussed the potential for PRP but I think honestly with the amount of pain and how it is affecting daily activities patient should consider the possibility of surgical intervention.  I would like patient to have a another opinion with her not feeling as confident in the other orthopedic surgeon at this time.  Patient will be referred today.  For the pain relief we discussed low dose of meloxicam warned potential side effects and to discontinue the ibuprofen.  Zanaflex given for nighttime pain.  Follow-up again as needed  Update 06/21/2020 Chenoah Mcnally is a 60 y.o. female coming in with complaint of right shoulder pain. Patient has surgery on 04/11/2020 R shoulder arthroscopy with SAD. Patient states that she has been having numbness in face, shaking and amplification of all noises in her ear since surgery but maybe had symptoms before surgery as well that were masked by pain. Patient did see neurology and they  feel she has infection. Does try to walk daily for exercise.  She is on an antibiotic at this point for the possibility of the infection but does not know if she has made any significant improvement at this time.  Patient does have MRI of head coming up.   Patient states that the shoulder overall feels good but is more the upper back and neck if she has any type of discomfort.  More concerned with the feeling aspect.  Reviewing patient's outside records patient did have a hearing test that was completely normal.  Barium swallow study was also normal.    Past Medical History:  Diagnosis Date  . Anxiety   . Depression   . Hair loss   . Insomnia   . Migraine   . Post-surgical hypothyroidism    ?cancer   Past Surgical History:  Procedure Laterality Date  . EYE SURGERY    . SHOULDER ARTHROSCOPY WITH SUBACROMIAL DECOMPRESSION Right 04/11/2020   Procedure: SHOULDER ARTHROSCOPY WITH SUBACROMIAL DECOMPRESSION;  Surgeon: Tania Ade, MD;  Location: [facility];  Service: Orthopedics;  Laterality: Right;   Social History   Socioeconomic History  . Marital status: Married    Spouse name: Not on file  . Number of children: 1  . Years of education: Not on file  . Highest education level: Not on file  Occupational History    Comment: unemployed  Tobacco Use  . Smoking status: Never Smoker  . Smokeless tobacco: Never Used  Substance and Sexual Activity  . Alcohol use: Yes    Alcohol/week: 0.0 standard drinks  Comment: rarely  . Drug use: No  . Sexual activity: Not on file  Other Topics Concern  . Not on file  Social History Narrative   Lives with husband.   Drinks only water. Denies caffeine use.   Social Determinants of Health   Financial Resource Strain: Not on file  Food Insecurity: Not on file  Transportation Needs: Not on file  Physical Activity: Not on file  Stress: Not on file  Social Connections: Not on file   Allergies  Allergen Reactions  .  Benadryl [Diphenhydramine Hcl] Other (See Comments)    Makes her skin crawl and she gets hyped up    Family History  Problem Relation Age of Onset  . Hypertension Father   . Heart disease Father   . Healthy Mother     Current Outpatient Medications (Endocrine & Metabolic):  .  levothyroxine (SYNTHROID) 88 MCG tablet, TAKE 1 TABLET BY MOUTH EVERY DAY BEFORE BREAKFAST    Current Outpatient Medications (Analgesics):  .  meloxicam (MOBIC) 7.5 MG tablet, Take 1 tablet (7.5 mg total) by mouth daily.   Current Outpatient Medications (Other):  .  gabapentin (NEURONTIN) 300 MG capsule, Take 300 mg by mouth 3 (three) times daily. Marland Kitchen  LORazepam (ATIVAN) 0.5 MG tablet, Take 0.5 mg by mouth every 8 (eight) hours. .  pantoprazole (PROTONIX) 40 MG tablet, Take 40 mg by mouth daily. .  sertraline (ZOLOFT) 100 MG tablet, Take 200 mg by mouth daily. Marland Kitchen  tiZANidine (ZANAFLEX) 2 MG tablet, TAKE 1 TABLET BY MOUTH EVERYDAY AT BEDTIME .  valACYclovir (VALTREX) 1000 MG tablet, Take 1,000 mg by mouth 2 (two) times daily.   Reviewed prior external information including notes and imaging from  primary care provider As well as notes that were available from care everywhere and other healthcare systems.  Past medical history, social, surgical and family history all reviewed in electronic medical record.  No pertanent information unless stated regarding to the chief complaint.   Review of Systems:  No headache, visual changes, nausea, vomiting, diarrhea, constipation, dizziness, abdominal pain, skin rash, fevers, chills, night sweats, weight loss, swollen lymph nodes, joint swelling, chest pain, shortness of breath, mood changes. POSITIVE muscle aches, body aches Objective  Blood pressure 130/84, pulse 77, height 5\' 3"  (1.6 m), weight 128 lb (58.1 kg), last menstrual period 08/16/2010, SpO2 97 %.   General: No apparent distress alert and oriented x3 mood and affect normal, dressed appropriately.  Patient does  appear to be anxious. HEENT: Pupils equal, extraocular movements intact patient appears to have good hearing.  Was able to do lateralization of hearing today with no significant difficulty. Respiratory: Patient's speak in full sentences and does not appear short of breath  Cardiovascular: No lower extremity edema, non tender, no erythema  Gait normal with good balance and coordination.  MSK: Patient's right shoulder has good range of motion but did not test strength.  Patient does have some mild scapular dyskinesis noted.  Some mild increase in kyphosis of the upper thoracic spine noted.  Negative Spurling's of the neck.  Near full range of motion but does have some tightness with sidebending bilaterally.    Impression and Recommendations:     The above documentation has been reviewed and is accurate and complete Lyndal Pulley, DO

## 2020-06-21 NOTE — Assessment & Plan Note (Signed)
Patient is continuing to have difficulty discussed with patient about scheduling. This could be a potential cause.  Patient will talk to her psychiatrist and primary care provider to see if anything else could be changed, patient has had significant work-up and is awaiting an MRI of the head at this time.  Patient has had a CT of the neck that was fairly unremarkable except for some calcific changes of the sinuses.  Discussed that I do not feel that the surgery will likely did have a contributing factor of this but patient does state that she did start taking her sertraline at a higher dose right after surgery.  Patient will consider possibly titrating back down but once again discussed that she should talk to her psychiatrist and primary care provider.  We discussed the only other thing we could consider to do today would be laboratory work-up but instead patient would like to work on exercises and given her more encouragement to work on posture decrease in leg pain.  We will get neck x-rays secondary to the tightness noted today.

## 2020-06-21 NOTE — Assessment & Plan Note (Signed)
Do feel that patient does have an underlying anxiety that is likely contributing to some of the discomfort and pain and amplifying some of the symptoms.

## 2020-06-21 NOTE — Patient Instructions (Signed)
Talk to your psychiatrist about changing Zoloft Xray today Keep MRI  Work on posture including standing on wall with shoulders back for 5 min a day Keep hands in peripheral vision See me again in 6-7 weeks

## 2020-06-21 NOTE — Assessment & Plan Note (Signed)
Status post surgery.  Seems to be doing relatively well with the shoulder but does have some neck pain.  Discussed working on scapular exercises.  X-rays of the neck pending.

## 2020-06-22 ENCOUNTER — Other Ambulatory Visit: Payer: Self-pay | Admitting: Internal Medicine

## 2020-06-22 DIAGNOSIS — R202 Paresthesia of skin: Secondary | ICD-10-CM

## 2020-06-22 DIAGNOSIS — R200 Anesthesia of skin: Secondary | ICD-10-CM

## 2020-06-26 ENCOUNTER — Ambulatory Visit
Admission: RE | Admit: 2020-06-26 | Discharge: 2020-06-26 | Disposition: A | Discharge status: Home / Self Care | Payer: Medicare Other | Source: Ambulatory Visit | Attending: Internal Medicine | Admitting: Internal Medicine

## 2020-06-26 ENCOUNTER — Other Ambulatory Visit: Payer: Self-pay

## 2020-06-26 DIAGNOSIS — R200 Anesthesia of skin: Secondary | ICD-10-CM

## 2020-06-27 ENCOUNTER — Telehealth: Payer: Self-pay | Admitting: Family Medicine

## 2020-06-27 NOTE — Telephone Encounter (Signed)
I do not see anything. No sign of parasite  Talk to neurology

## 2020-06-27 NOTE — Telephone Encounter (Signed)
Patient called stating that she thinks she may have a parasite based on current symptoms that she has going on at this time (red spots on skin, hearing loss, etc.). She asked if Dr Tamala Julian could look at her MRI to see if there is anything on it that might show signs or a parasite?  Please advise.

## 2020-06-28 NOTE — Telephone Encounter (Signed)
Left message for patient

## 2020-07-06 ENCOUNTER — Other Ambulatory Visit: Payer: Medicare Other

## 2020-07-13 ENCOUNTER — Ambulatory Visit: Payer: Medicare Other | Admitting: Diagnostic Neuroimaging

## 2020-07-21 ENCOUNTER — Telehealth: Payer: Self-pay | Admitting: Internal Medicine

## 2020-07-21 NOTE — Telephone Encounter (Signed)
Patient called back to check and see what the Dr said. I advised patient the nurse will get back to her whenever she has a free chance.

## 2020-07-21 NOTE — Telephone Encounter (Signed)
Patient called and is requesting that her female hormone levels be checked. Also states thyroid labs were done recently at her PCP during a sick visit and PCP is wanting her to change her Levothyroxine from 88 mcg to 100 mcg  Requesting a lab appointment and a call back to [phone]

## 2020-07-25 NOTE — Telephone Encounter (Signed)
Pt would like like a call back regarding a couple questions pt has.

## 2020-07-25 NOTE — Telephone Encounter (Signed)
Called and left a message for pt requesting a call back with PCP information to request lab work. Advised pt to contact OB/GYN for 'female hormone' check.

## 2020-07-25 NOTE — Telephone Encounter (Addendum)
Called pt and was advised pt went to an urgent care and had labs done. They advised she needed to adjust her thyroid medication and she wanted to confirm change with Dr Cruzita Lederer. Pt did not have number to office. Will attempt contact Urgent Care for recent labs at [phone]. Pt was seen as [facility] [phone] and a message was sent to pt's PCP to forward labs

## 2020-08-02 ENCOUNTER — Ambulatory Visit: Payer: Medicare Other | Admitting: Family Medicine

## 2020-08-11 ENCOUNTER — Other Ambulatory Visit (INDEPENDENT_AMBULATORY_CARE_PROVIDER_SITE_OTHER): Payer: BC Managed Care – PPO

## 2020-08-11 ENCOUNTER — Other Ambulatory Visit: Payer: Self-pay

## 2020-08-11 ENCOUNTER — Other Ambulatory Visit: Payer: Self-pay | Admitting: Internal Medicine

## 2020-08-11 DIAGNOSIS — E890 Postprocedural hypothyroidism: Secondary | ICD-10-CM

## 2020-08-11 LAB — T4, FREE: Free T4: 1.02 ng/dL (ref 0.60–1.60)

## 2020-08-11 LAB — TSH: TSH: 0.4 u[IU]/mL (ref 0.35–4.50)

## 2020-09-01 NOTE — Progress Notes (Deleted)
[place] Phone: [phone] Subjective:    I'm seeing this patient by the request  of:  [name], MD  CC:   [id number]   06/21/2020 Status post surgery.  Seems to be doing relatively well with the shoulder but does have some neck pain.  Discussed working on scapular exercises.  X-rays of the neck pending.  Update 09/02/2020 Arnie Maiolo is a 60 y.o. female coming in with complaint of R shoulder pain post-surgery and cervical/thoracic spine pain. Patient states   Onset-  Location Duration-  Character- Aggravating factors- Reliving factors-  Therapies tried-  Severity-     Past Medical History:  Diagnosis Date  . Anxiety   . Depression   . Hair loss   . Insomnia   . Migraine   . Post-surgical hypothyroidism    ?cancer   Past Surgical History:  Procedure Laterality Date  . EYE SURGERY    . SHOULDER ARTHROSCOPY WITH SUBACROMIAL DECOMPRESSION Right 04/11/2020   Procedure: SHOULDER ARTHROSCOPY WITH SUBACROMIAL DECOMPRESSION;  Surgeon: Tania Ade, MD;  Location: [facility];  Service: Orthopedics;  Laterality: Right;   Social History   Socioeconomic History  . Marital status: Married    Spouse name: Not on file  . Number of children: 1  . Years of education: Not on file  . Highest education level: Not on file  Occupational History    Comment: unemployed  Tobacco Use  . Smoking status: Never Smoker  . Smokeless tobacco: Never Used  Substance and Sexual Activity  . Alcohol use: Yes    Alcohol/week: 0.0 standard drinks    Comment: rarely  . Drug use: No  . Sexual activity: Not on file  Other Topics Concern  . Not on file  Social History Narrative   Lives with husband.   Drinks only water. Denies caffeine use.   Social Determinants of Health   Financial Resource Strain: Not on file  Food Insecurity: Not on file  Transportation Needs: Not on file   Physical Activity: Not on file  Stress: Not on file  Social Connections: Not on file   Allergies  Allergen Reactions  . Benadryl [Diphenhydramine Hcl] Other (See Comments)    Makes her skin crawl and she gets hyped up    Family History  Problem Relation Age of Onset  . Hypertension Father   . Heart disease Father   . Healthy Mother     Current Outpatient Medications (Endocrine & Metabolic):  .  levothyroxine (SYNTHROID) 88 MCG tablet, TAKE 1 TABLET BY MOUTH EVERY DAY BEFORE BREAKFAST    Current Outpatient Medications (Analgesics):  .  meloxicam (MOBIC) 7.5 MG tablet, Take 1 tablet (7.5 mg total) by mouth daily.   Current Outpatient Medications (Other):  .  gabapentin (NEURONTIN) 300 MG capsule, Take 300 mg by mouth 3 (three) times daily. Marland Kitchen  LORazepam (ATIVAN) 0.5 MG tablet, Take 0.5 mg by mouth every 8 (eight) hours. .  pantoprazole (PROTONIX) 40 MG tablet, Take 40 mg by mouth daily. .  sertraline (ZOLOFT) 100 MG tablet, Take 200 mg by mouth daily. Marland Kitchen  tiZANidine (ZANAFLEX) 2 MG tablet, TAKE 1 TABLET BY MOUTH EVERYDAY AT BEDTIME .  valACYclovir (VALTREX) 1000 MG tablet, Take 1,000 mg by mouth 2 (two) times daily.   Reviewed prior external information including notes and imaging from  primary care provider As well as notes that were available from care everywhere and other healthcare systems.  Past medical history, social, surgical and family history all reviewed in electronic medical record.  No pertanent information unless stated regarding to the chief complaint.   Review of Systems:  No headache, visual changes, nausea, vomiting, diarrhea, constipation, dizziness, abdominal pain, skin rash, fevers, chills, night sweats, weight loss, swollen lymph nodes, body aches, joint swelling, chest pain, shortness of breath, mood changes. POSITIVE muscle aches  Objective  Last menstrual period 08/16/2010.   General: No apparent distress alert and oriented x3 mood and affect  normal, dressed appropriately.  HEENT: Pupils equal, extraocular movements intact  Respiratory: Patient's speak in full sentences and does not appear short of breath  Cardiovascular: No lower extremity edema, non tender, no erythema  Gait normal with good balance and coordination.  MSK:  Non tender with full range of motion and good stability and symmetric strength and tone of shoulders, elbows, wrist, hip, knee and ankles bilaterally.     Impression and Recommendations:     The above documentation has been reviewed and is accurate and complete Jacqualin Combes

## 2020-09-02 ENCOUNTER — Ambulatory Visit: Payer: Medicare Other | Admitting: Family Medicine

## 2020-09-15 ENCOUNTER — Other Ambulatory Visit: Payer: Self-pay | Admitting: Specialist

## 2020-09-15 DIAGNOSIS — R9082 White matter disease, unspecified: Secondary | ICD-10-CM

## 2020-09-15 DIAGNOSIS — R55 Syncope and collapse: Secondary | ICD-10-CM

## 2020-09-15 DIAGNOSIS — R4189 Other symptoms and signs involving cognitive functions and awareness: Secondary | ICD-10-CM

## 2020-09-21 ENCOUNTER — Other Ambulatory Visit: Payer: Self-pay

## 2020-09-21 ENCOUNTER — Ambulatory Visit
Admission: RE | Admit: 2020-09-21 | Discharge: 2020-09-21 | Disposition: A | Discharge status: Home / Self Care | Payer: Medicare Other | Source: Ambulatory Visit | Attending: Specialist | Admitting: Specialist

## 2020-09-21 DIAGNOSIS — R9082 White matter disease, unspecified: Secondary | ICD-10-CM

## 2020-09-21 DIAGNOSIS — R4189 Other symptoms and signs involving cognitive functions and awareness: Secondary | ICD-10-CM

## 2020-09-21 DIAGNOSIS — R55 Syncope and collapse: Secondary | ICD-10-CM

## 2020-09-22 ENCOUNTER — Other Ambulatory Visit: Payer: BC Managed Care – PPO

## 2020-09-27 ENCOUNTER — Telehealth: Payer: Self-pay

## 2020-09-27 NOTE — Telephone Encounter (Addendum)
-----   Message from Philemon Kingdom, MD sent at 09/26/2020  5:35 PM EDT ----- Orpha Bur, can you please call pt:  I received her recent TSH result from [facility] and it is excellent. Please continue the same LT4 dose. Ty! C Pt  verbalized understanding.

## 2020-11-24 ENCOUNTER — Other Ambulatory Visit: Payer: Self-pay

## 2020-11-24 ENCOUNTER — Ambulatory Visit (INDEPENDENT_AMBULATORY_CARE_PROVIDER_SITE_OTHER): Payer: Medicare Other | Admitting: Family Medicine

## 2020-11-24 ENCOUNTER — Encounter: Payer: Self-pay | Admitting: Family Medicine

## 2020-11-24 ENCOUNTER — Ambulatory Visit (INDEPENDENT_AMBULATORY_CARE_PROVIDER_SITE_OTHER): Payer: Medicare Other

## 2020-11-24 VITALS — BP 120/90 | HR 105 | Ht 63.0 in | Wt 123.0 lb

## 2020-11-24 DIAGNOSIS — M2550 Pain in unspecified joint: Secondary | ICD-10-CM

## 2020-11-24 DIAGNOSIS — M542 Cervicalgia: Secondary | ICD-10-CM

## 2020-11-24 DIAGNOSIS — E538 Deficiency of other specified B group vitamins: Secondary | ICD-10-CM | POA: Diagnosis not present

## 2020-11-24 DIAGNOSIS — E559 Vitamin D deficiency, unspecified: Secondary | ICD-10-CM

## 2020-11-24 DIAGNOSIS — G44309 Post-traumatic headache, unspecified, not intractable: Secondary | ICD-10-CM | POA: Insufficient documentation

## 2020-11-24 LAB — IBC PANEL
Iron: 84 ug/dL (ref 42–145)
Saturation Ratios: 23.3 % (ref 20.0–50.0)
TIBC: 361.2 ug/dL (ref 250.0–450.0)
Transferrin: 258 mg/dL (ref 212.0–360.0)

## 2020-11-24 LAB — COMPREHENSIVE METABOLIC PANEL
ALT: 23 U/L (ref 0–35)
AST: 18 U/L (ref 0–37)
Albumin: 4.2 g/dL (ref 3.5–5.2)
Alkaline Phosphatase: 72 U/L (ref 39–117)
BUN: 15 mg/dL (ref 6–23)
CO2: 27 mEq/L (ref 19–32)
Calcium: 9.3 mg/dL (ref 8.4–10.5)
Chloride: 106 mEq/L (ref 96–112)
Creatinine, Ser: 0.8 mg/dL (ref 0.40–1.20)
GFR: 80.12 mL/min (ref >60.00)
Glucose, Bld: 93 mg/dL (ref 70–99)
Potassium: 3.4 mEq/L — ABNORMAL LOW (ref 3.5–5.1)
Sodium: 142 mEq/L (ref 135–145)
Total Bilirubin: 0.4 mg/dL (ref 0.2–1.2)
Total Protein: 6.8 g/dL (ref 6.0–8.3)

## 2020-11-24 LAB — CBC WITH DIFFERENTIAL/PLATELET
Basophils Absolute: 0.1 10*3/uL (ref 0.0–0.1)
Basophils Relative: 0.8 % (ref 0.0–3.0)
Eosinophils Absolute: 0 10*3/uL (ref 0.0–0.7)
Eosinophils Relative: 0.4 % (ref 0.0–5.0)
HCT: 39.9 % (ref 36.0–46.0)
Hemoglobin: 13.7 g/dL (ref 12.0–15.0)
Lymphocytes Relative: 18.8 % (ref 12.0–46.0)
Lymphs Abs: 1.5 10*3/uL (ref 0.7–4.0)
MCHC: 34.2 g/dL (ref 30.0–36.0)
MCV: 95.9 fl (ref 78.0–100.0)
Monocytes Absolute: 0.6 10*3/uL (ref 0.1–1.0)
Monocytes Relative: 7 % (ref 3.0–12.0)
Neutro Abs: 5.9 10*3/uL (ref 1.4–7.7)
Neutrophils Relative %: 73 % (ref 43.0–77.0)
Platelets: 323 10*3/uL (ref 150.0–400.0)
RBC: 4.16 Mil/uL (ref 3.87–5.11)
RDW: 13.8 % (ref 11.5–15.5)
WBC: 8 10*3/uL (ref 4.0–10.5)

## 2020-11-24 LAB — FERRITIN: Ferritin: 41.9 ng/mL (ref 10.0–291.0)

## 2020-11-24 LAB — FOLATE: Folate: >24.4 ng/mL (ref >5.9)

## 2020-11-24 NOTE — Progress Notes (Signed)
[facility] [place] Phone: [phone] Subjective:   Kristen [name], am serving as a scribe for Dr. Hulan Saas.  I'm seeing this patient by the request  of:  [name], MD  CC: Neck pain after fall   [id number]  Kristen [name] is a 60 y.o. female coming in with complaint of neck pan. Last seen in March 2022 for anxiety, cervical spine pain and shoulder pain. Patient states that she fell a month ago when she was standing on a chair to reach something, leg gave way and she fell backward and hit her head on the counter. Patient went to the emergency room that same day. Patient is having trouble with headaches and her neck has been bothering her. Patient sees a chiropractor who has been helping her and the pain has decreased as well as the swelling. Patient locates pain to base of her neck between shoulder blades. Patient does exercises that were given from the chiropractor to help with pain. 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 Patient has been experiencing tingling in her face that she was put on acyclovir for which is not helping. Patient has been seeing neurology and PCP for this issues.      Past Medical History:  Diagnosis Date   Anxiety    Depression    Hair loss    Insomnia    Migraine    Post-surgical hypothyroidism    ?cancer   Past Surgical History:  Procedure Laterality Date   EYE SURGERY     SHOULDER ARTHROSCOPY WITH SUBACROMIAL DECOMPRESSION Right 04/11/2020   Procedure: SHOULDER ARTHROSCOPY WITH SUBACROMIAL DECOMPRESSION;  Surgeon: Tania Ade, MD;  Location: [place];  Service: Orthopedics;  Laterality: Right;   Social History   Socioeconomic History   Marital status: Married    Spouse name: Not on file   Number of children: 1   Years of education: Not on file   Highest education level: Not on file   Occupational History    Comment: unemployed  Tobacco Use   Smoking status: Never   Smokeless tobacco: Never  Substance and Sexual Activity   Alcohol use: Yes    Alcohol/week: 0.0 standard drinks    Comment: rarely   Drug use: No   Sexual activity: Not on file  Other Topics Concern   Not on file  Social History Narrative   Lives with husband.   Drinks only water. Denies caffeine use.   Social Determinants of Health   Financial Resource Strain: Not on file  Food Insecurity: Not on file  Transportation Needs: Not on file  Physical Activity: Not on file  Stress: Not on file  Social Connections: Not on file   Allergies  Allergen Reactions   Benadryl [Diphenhydramine Hcl] Other (See Comments)    Makes her skin crawl and she gets hyped up    Family History  Problem Relation Age of Onset   Hypertension Father    Heart disease Father    Healthy Mother     Current Outpatient Medications (Endocrine & Metabolic):    levothyroxine (SYNTHROID) 88 MCG tablet, TAKE 1 TABLET BY MOUTH EVERY DAY BEFORE BREAKFAST (Patient not taking: Reported on 11/24/2020)    Current Outpatient Medications (Analgesics):    meloxicam (MOBIC) 7.5 MG tablet, Take 1 tablet (7.5 mg total) by mouth daily. (Patient not taking: Reported on 11/24/2020)  Current Outpatient Medications (Other):    acyclovir (ZOVIRAX) 800 MG tablet, Take 800 mg by mouth daily.   dicyclomine (BENTYL) 20 MG tablet, Take 20 mg by mouth every 8 (eight) hours as needed.   pantoprazole (PROTONIX) 40 MG tablet, Take 40 mg by mouth daily.   sertraline (ZOLOFT) 100 MG tablet, Take 200 mg by mouth daily.   valACYclovir (VALTREX) 1000 MG tablet, Take 1,000 mg by mouth 2 (two) times daily.   zolpidem (AMBIEN) 10 MG tablet, Take 10 mg by mouth at bedtime.   gabapentin (NEURONTIN) 300 MG capsule, Take 300 mg by mouth 3 (three) times daily. (Patient not taking: Reported on 11/24/2020)   LORazepam (ATIVAN) 0.5 MG tablet, Take 0.5 mg by mouth  every 8 (eight) hours. (Patient not taking: Reported on 11/24/2020)   tiZANidine (ZANAFLEX) 2 MG tablet, TAKE 1 TABLET BY MOUTH EVERYDAY AT BEDTIME (Patient not taking: Reported on 11/24/2020)   Reviewed prior external information including notes and imaging from  primary care provider As well as notes that were available from care everywhere and other healthcare systems.  Past medical history, social, surgical and family history all reviewed in electronic medical record.  No pertanent information unless stated regarding to the chief complaint.   Review of Systems:  No  visual changes, nausea, vomiting, diarrhea, constipation, dizziness, abdominal pain, skin rash, fevers, chills, night sweats, weight loss, swollen lymph nodes, , joint swelling, chest pain, shortness of breath, mood changes. POSITIVE muscle aches, body aches, numbness, headaches  Objective  Blood pressure 120/90, pulse (!) 105, height '5\' 3"'$  (1.6 m), weight 123 lb (55.8 kg), last menstrual period 08/16/2010, SpO2 99 %.   General: No apparent distress alert the patient is very anxious. HEENT: Pupils equal, extraocular movements intact  Respiratory: Patient's speak in full sentences and does not appear short of breath  Cardiovascular: No lower extremity edema, non tender, no erythema  Gait normal overall. MSK: Patient's neck exam does show some very mild loss of lordosis.  Tightness noted. Patient does have relatively good strength but does have the physiologic tremor noted of the upper extremities.  Lower extremities does have some mild atrophy of the thigh muscles.  Decent balance are noted today.  Deep tendon reflexes seem to be intact.   Impression and Recommendations:     The above documentation has been reviewed and is accurate and complete Lyndal Pulley, DO

## 2020-11-24 NOTE — Patient Instructions (Addendum)
Good to see you  CoQ10 '200mg'$  daily over the counter  Get X-ray on your way out Get labs on your way out  See me again in 6-8 weeks

## 2020-11-24 NOTE — Assessment & Plan Note (Signed)
Posttraumatic headache.  Discussed over-the-counter medicines.  I do feel that patient's anxiety is giving her difficulty.  Having significant pain with vestibular neuro with some mild dizziness and we will have patient start with formal physical therapy.  We will get laboratory work-up to see if anything is contributing to some of patient's other symptoms.  Follow-up with me again 6 to 8 weeks.  Patient knows if any worsening pain to seek medical attention immediately.  We did discuss patient's anxiety seems to be exacerbating some of the symptoms and patient will monitor this as well.

## 2020-11-26 ENCOUNTER — Encounter: Payer: Self-pay | Admitting: Family Medicine

## 2020-11-28 ENCOUNTER — Telehealth: Payer: Self-pay | Admitting: Family Medicine

## 2020-11-28 ENCOUNTER — Encounter: Payer: Self-pay | Admitting: Family Medicine

## 2020-11-28 LAB — VITAMIN D 1,25 DIHYDROXY
Vitamin D 1, 25 (OH)2 Total: 65 pg/mL (ref 18–72)
Vitamin D2 1, 25 (OH)2: <8 pg/mL
Vitamin D3 1, 25 (OH)2: 65 pg/mL

## 2020-11-28 NOTE — Telephone Encounter (Signed)
Pt called, tearful. Noted something brown developed on her nose and wanted Dr. Tamala Julian to take a look at it. I informed patient that this would not be a Sports Medicine issue and recommended she call her PCP for instruction.

## 2020-11-30 ENCOUNTER — Encounter: Payer: Self-pay | Admitting: Family Medicine

## 2020-12-02 ENCOUNTER — Other Ambulatory Visit: Payer: Self-pay

## 2020-12-02 ENCOUNTER — Ambulatory Visit (INDEPENDENT_AMBULATORY_CARE_PROVIDER_SITE_OTHER): Payer: Medicare Other | Admitting: Student

## 2020-12-02 ENCOUNTER — Encounter: Payer: Self-pay | Admitting: Student

## 2020-12-02 ENCOUNTER — Ambulatory Visit (INDEPENDENT_AMBULATORY_CARE_PROVIDER_SITE_OTHER): Payer: Medicare Other

## 2020-12-02 VITALS — BP 130/68 | HR 85 | Temp 98.8°F | Ht 63.0 in | Wt 124.4 lb

## 2020-12-02 DIAGNOSIS — R0609 Other forms of dyspnea: Secondary | ICD-10-CM

## 2020-12-02 DIAGNOSIS — R051 Acute cough: Secondary | ICD-10-CM

## 2020-12-02 DIAGNOSIS — R0600 Dyspnea, unspecified: Secondary | ICD-10-CM | POA: Diagnosis not present

## 2020-12-02 MED ORDER — FLUTICASONE PROPIONATE 50 MCG/ACT NA SUSP: 1.0000 | Freq: Every day | NASAL | 5 refills | Status: DC

## 2020-12-02 NOTE — Progress Notes (Signed)
Synopsis: Referred for cough by [name], MD  Subjective:   PATIENT ID: Kristen [name] GENDER: female DOB: 08-14-1960, MRN: [id number]  Chief Complaint  Patient presents with   Consult    DOE, gasping at night, productive cough, green sputum    60yF with history of anxiety, GERD, LPR, sinus surgery for deviated septum, never smoker, no vaping, post ablation hypothyroid seen for cough. SHe was last seen by ENT 07/2020 and thought to have thrush, treated with miconazole.  She describes 3 episodes of waking at night, 'kind of jerking,' gasping for air - husband questions however whether this may be related to anxiety. Snores a little but not loudly. She also mentions dyspnea with exertion and weakness. 3 steps on stairs and she has to stop for weakness and just a little bit of shortness of breath. Denies accompanying throat tightness, hoarseness. She has had a cough she says only for the past week but denies that this is a chronic issue. No known sick contacts, fever. She is vaccinated for covid, hasn't been tested recently.  No orthopnea.   Otherwise pertinent review of systems is negative.  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 Had no respiratory symptoms during the time she worked those jobs. She has a mini toy poodle, 1 cat. No pet birds or hot tub. She lived in [place] when she was younger but otherwise in [place]. Lives in [place] now.   Past Medical History:  Diagnosis Date   Anxiety    Depression    Hair loss    Insomnia    Migraine    Post-surgical hypothyroidism    ?cancer     Family History  Problem Relation Age of Onset   Hypertension Father    Heart disease Father    Healthy Mother      Past Surgical History:  Procedure Laterality Date   EYE SURGERY     SHOULDER ARTHROSCOPY WITH SUBACROMIAL DECOMPRESSION Right 04/11/2020   Procedure:  SHOULDER ARTHROSCOPY WITH SUBACROMIAL DECOMPRESSION;  Surgeon: Tania Ade, MD;  Location: [facility];  Service: Orthopedics;  Laterality: Right;    Social History   Socioeconomic History   Marital status: Married    Spouse name: Not on file   Number of children: 1   Years of education: Not on file   Highest education level: Not on file  Occupational History    Comment: unemployed  Tobacco Use   Smoking status: Never   Smokeless tobacco: Never  Substance and Sexual Activity   Alcohol use: Yes    Alcohol/week: 0.0 standard drinks    Comment: rarely   Drug use: No   Sexual activity: Not on file  Other Topics Concern   Not on file  Social History Narrative   Lives with husband.   Drinks only water. Denies caffeine use.   Social Determinants of Health   Financial Resource Strain: Not on file  Food Insecurity: Not on file  Transportation Needs: Not on file  Physical Activity: Not on file  Stress: Not on file  Social Connections: Not on file  Intimate Partner Violence: Not on file     Allergies  Allergen Reactions   Benadryl [Diphenhydramine Hcl] Other (See Comments)    Makes her skin crawl and she gets hyped up      Outpatient Medications Prior to Visit  Medication Sig Dispense  Refill   acyclovir (ZOVIRAX) 800 MG tablet Take 800 mg by mouth daily.     dicyclomine (BENTYL) 20 MG tablet Take 20 mg by mouth every 8 (eight) hours as needed.     fexofenadine (ALLEGRA) 180 MG tablet Allegra Allergy     pantoprazole (PROTONIX) 40 MG tablet Take 40 mg by mouth daily.     thiamine (VITAMIN B-1) 100 MG tablet Take 100 mg by mouth daily.     zolpidem (AMBIEN) 10 MG tablet Take 10 mg by mouth at bedtime.     gabapentin (NEURONTIN) 300 MG capsule Take 300 mg by mouth 3 (three) times daily. (Patient not taking: No sig reported)     levothyroxine (SYNTHROID) 88 MCG tablet TAKE 1 TABLET BY MOUTH EVERY DAY BEFORE BREAKFAST (Patient not taking: No sig reported) 90  tablet 2   LORazepam (ATIVAN) 0.5 MG tablet Take 0.5 mg by mouth every 8 (eight) hours. (Patient not taking: No sig reported)     meloxicam (MOBIC) 7.5 MG tablet Take 1 tablet (7.5 mg total) by mouth daily. (Patient not taking: No sig reported) 30 tablet 0   sertraline (ZOLOFT) 100 MG tablet Take 200 mg by mouth daily. (Patient not taking: Reported on 12/02/2020)  1   tiZANidine (ZANAFLEX) 2 MG tablet TAKE 1 TABLET BY MOUTH EVERYDAY AT BEDTIME (Patient not taking: No sig reported) 30 tablet 0   valACYclovir (VALTREX) 1000 MG tablet Take 1,000 mg by mouth 2 (two) times daily.     No facility-administered medications prior to visit.       Objective:   Physical Exam:  General appearance: 60 y.o., adult, NAD, conversant  Eyes: anicteric sclerae, moist conjunctivae; no lid-lag; PERRL, tracking appropriately HENT: NCAT; oropharynx,  dry MM, no mucosal ulcerations; normal hard and soft palate. Has what looks almost like whitish plaque over tongue but I really only scrape off frothy saliva and I think she just has a dry mouth. Mucoid secretions both nares. Neck: Trachea midline; no lymphadenopathy, no JVD Lungs: CTAB, no crackles, no wheeze, with normal respiratory effort CV: RRR, no MRGs  Abdomen: Soft, non-tender; non-distended, BS present  Extremities: No peripheral edema, radial and DP pulses present bilaterally  Skin: Normal temperature, turgor and texture; no rash Psych: Appropriate affect Neuro: Alert and oriented to person and place, no focal deficit    Vitals:   12/02/20 1021  BP: 130/68  Pulse: 85  Temp: 98.8 F (37.1 C)  TempSrc: Oral  SpO2: 96%  Weight: 124 lb 6.4 oz (56.4 kg)  Height: '5\' 3"'$  (1.6 m)   96% on RA BMI Readings from Last 3 Encounters:  12/02/20 22.04 kg/m  11/24/20 21.79 kg/m  06/21/20 22.67 kg/m   Wt Readings from Last 3 Encounters:  12/02/20 124 lb 6.4 oz (56.4 kg)  11/24/20 123 lb (55.8 kg)  06/21/20 128 lb (58.1 kg)     CBC    Component  Value Date/Time   WBC 8.0 11/24/2020 1358   RBC 4.16 11/24/2020 1358   HGB 13.7 11/24/2020 1358   HCT 39.9 11/24/2020 1358   PLT 323.0 11/24/2020 1358   MCV 95.9 11/24/2020 1358   MCH 30.6 05/03/2020 0822   MCHC 34.2 11/24/2020 1358   RDW 13.8 11/24/2020 1358   LYMPHSABS 1.5 11/24/2020 1358   MONOABS 0.6 11/24/2020 1358   EOSABS 0.0 11/24/2020 1358   BASOSABS 0.1 11/24/2020 1358    Chest Imaging: T-spine x ray 06/2020 reviewed by me and remarkable for prominent interstitial markings  Pulmonary Functions Testing Results: No flowsheet data found. None     Assessment & Plan:   # Acute vs chronic cough: # Dyspnea: Unclear etiology and severity of limitation is challenging to elicit from her history. No substantial limitation on level ground from DOE standpoint, but can only do 3 steps up stairs (although limitation is primarily due to weakness with stairs however she is not clearly weak on gross strength testing). Even chronicity of cough is tough to discern from history - she says it's a few days' worth of cough but chart review indicates it's a longstanding issue. Already on and adherent to BID ppi. Although not overtly symptomatic of post nasal drainage, given mucoid secretions on anterior rhinoscopy will trial flonase/nasal irrigation.  # Paroxysmal nocturnal dyspnea: may also just be waking up with panic attacks.   Plan: - CXR - PFTs - trial of flonase after either shower or rinsing with neti pot - discuss sleep study at next visit     Maryjane Hurter, MD [facility] 12/02/2020 11:02 AM

## 2020-12-02 NOTE — Patient Instructions (Signed)
-   After shower or neti pot rinse then: - Flonase - 1 spray on each side of your nose twice a day for first week, then 1 spray on each side.   Instructions for use: If you also use a saline nasal spray or rinse, use that first. Position the head with the chin slightly tucked. Use the right hand to spray into the left nostril and the right hand to spray into the left nostril.   Point the bottle away from the septum of your nose (cartilage that divides the two sides of your nose).  Hold the nostril closed on the opposite side from where you will spray Spray once and gently sniff to pull the medicine into the higher parts of your nose.  Don't sniff too hard as the medicine will drain down the back of your throat instead. Repeat with a second spray on the same side if prescribed. Repeat on the other side of your nose. - We will check x ray today - Breathing tests today

## 2020-12-09 ENCOUNTER — Ambulatory Visit (INDEPENDENT_AMBULATORY_CARE_PROVIDER_SITE_OTHER): Payer: Medicare Other | Admitting: Student

## 2020-12-09 ENCOUNTER — Other Ambulatory Visit: Payer: Self-pay

## 2020-12-09 DIAGNOSIS — R0600 Dyspnea, unspecified: Secondary | ICD-10-CM

## 2020-12-09 DIAGNOSIS — R0609 Other forms of dyspnea: Secondary | ICD-10-CM

## 2020-12-09 LAB — PULMONARY FUNCTION TEST
DL/VA % pred: 103 %
DL/VA: 4.4 ml/min/mmHg/L
DLCO cor % pred: 93 %
DLCO cor: 18.2 ml/min/mmHg
DLCO unc % pred: 93 %
DLCO unc: 18.36 ml/min/mmHg
FEF 25-75 Post: 3.74 L/sec
FEF 25-75 Pre: 2.33 L/sec
FEF2575-%Change-Post: 60 %
FEF2575-%Pred-Post: 163 %
FEF2575-%Pred-Pre: 101 %
FEV1-%Change-Post: 14 %
FEV1-%Pred-Post: 89 %
FEV1-%Pred-Pre: 78 %
FEV1-Post: 2.2 L
FEV1-Pre: 1.93 L
FEV1FVC-%Change-Post: 2 %
FEV1FVC-%Pred-Pre: 105 %
FEV6-%Change-Post: 10 %
FEV6-%Pred-Post: 84 %
FEV6-%Pred-Pre: 76 %
FEV6-Post: 2.61 L
FEV6-Pre: 2.35 L
FEV6FVC-%Pred-Post: 103 %
FEV6FVC-%Pred-Pre: 103 %
FVC-%Change-Post: 10 %
FVC-%Pred-Post: 81 %
FVC-%Pred-Pre: 73 %
FVC-Post: 2.61 L
FVC-Pre: 2.35 L
Post FEV1/FVC ratio: 84 %
Post FEV6/FVC ratio: 100 %
Pre FEV1/FVC ratio: 82 %
Pre FEV6/FVC Ratio: 100 %
RV % pred: 123 %
RV: 2.39 L
TLC % pred: 100 %
TLC: 4.91 L

## 2020-12-09 NOTE — Progress Notes (Signed)
PFT done today. 

## 2020-12-20 ENCOUNTER — Telehealth: Payer: Self-pay | Admitting: Student

## 2020-12-20 NOTE — Telephone Encounter (Signed)
There is air trapping on this breathing test (meaning some air gets left behind in the tiny air sacs within the lungs called alveoli even when you may feel like you've taken a full breath out). Although there technically isn't obstructive lung disease (something we see in patients with asthma or COPD among other diagnoses) we can still talk about the utility of adding an inhaler if you're still dealing with cough and/or shortness of breath at your next visit.     I have called and spoke with pt and she is aware of results of PFT.  She is going to keep the appt for December and she will call if anything comes up.

## 2020-12-30 ENCOUNTER — Other Ambulatory Visit: Payer: Self-pay

## 2020-12-30 DIAGNOSIS — R2689 Other abnormalities of gait and mobility: Secondary | ICD-10-CM

## 2020-12-30 DIAGNOSIS — M542 Cervicalgia: Secondary | ICD-10-CM

## 2020-12-30 NOTE — Progress Notes (Unsigned)
Amb referr

## 2021-01-03 NOTE — Progress Notes (Deleted)
[facility] [place] Phone: [phone] Subjective:    I'm seeing this patient by the request  of:  [name], MD  CC:   [id number]  11/24/2020 Posttraumatic headache.  Discussed over-the-counter medicines.  I do feel that patient's anxiety is giving her difficulty.  Having significant pain with vestibular neuro with some mild dizziness and we will have patient start with formal physical therapy.  We will get laboratory work-up to see if anything is contributing to some of patient's other symptoms.  Follow-up with me again 6 to 8 weeks.  Patient knows if any worsening pain to seek medical attention immediately.  We did discuss patient's anxiety seems to be exacerbating some of the symptoms and patient will monitor this as well.  Update 01/04/2021 Kristen [name] is a 60 y.o. adult coming in with complaint of neck pain. Patient states        Past Medical History:  Diagnosis Date   Anxiety    Depression    Hair loss    Insomnia    Migraine    Post-surgical hypothyroidism    ?cancer   Past Surgical History:  Procedure Laterality Date   EYE SURGERY     SHOULDER ARTHROSCOPY WITH SUBACROMIAL DECOMPRESSION Right 04/11/2020   Procedure: SHOULDER ARTHROSCOPY WITH SUBACROMIAL DECOMPRESSION;  Surgeon: Tania Ade, MD;  Location: [facility];  Service: Orthopedics;  Laterality: Right;   Social History   Socioeconomic History   Marital status: Married    Spouse name: Not on file   Number of children: 1   Years of education: Not on file   Highest education level: Not on file  Occupational History    Comment: unemployed  Tobacco Use   Smoking status: Never   Smokeless tobacco: Never  Substance and Sexual Activity   Alcohol use: Yes    Alcohol/week: 0.0 standard drinks    Comment: rarely   Drug use: No   Sexual activity: Not on file  Other Topics Concern   Not on file  Social History  Narrative   Lives with husband.   Drinks only water. Denies caffeine use.   Social Determinants of Health   Financial Resource Strain: Not on file  Food Insecurity: Not on file  Transportation Needs: Not on file  Physical Activity: Not on file  Stress: Not on file  Social Connections: Not on file   Allergies  Allergen Reactions   Benadryl [Diphenhydramine Hcl] Other (See Comments)    Makes her skin crawl and she gets hyped up    Family History  Problem Relation Age of Onset   Hypertension Father    Heart disease Father    Healthy Mother     Current Outpatient Medications (Endocrine & Metabolic):    levothyroxine (SYNTHROID) 88 MCG tablet, TAKE 1 TABLET BY MOUTH EVERY DAY BEFORE BREAKFAST (Patient not taking: No sig reported)   Current Outpatient Medications (Respiratory):    fexofenadine (ALLEGRA) 180 MG tablet, Allegra Allergy   fluticasone (FLONASE) 50 MCG/ACT nasal spray, Place 1 spray into both nostrils daily.  Current Outpatient Medications (Analgesics):    meloxicam (MOBIC) 7.5 MG tablet, Take 1 tablet (7.5 mg total) by mouth daily. (Patient not taking: No sig reported)   Current Outpatient Medications (Other):    acyclovir (ZOVIRAX) 800 MG tablet, Take 800 mg by mouth daily.   dicyclomine (BENTYL) 20 MG tablet, Take 20 mg by mouth every 8 (eight) hours as needed.   gabapentin (  NEURONTIN) 300 MG capsule, Take 300 mg by mouth 3 (three) times daily. (Patient not taking: No sig reported)   LORazepam (ATIVAN) 0.5 MG tablet, Take 0.5 mg by mouth every 8 (eight) hours. (Patient not taking: No sig reported)   pantoprazole (PROTONIX) 40 MG tablet, Take 40 mg by mouth daily.   sertraline (ZOLOFT) 100 MG tablet, Take 200 mg by mouth daily. (Patient not taking: Reported on 12/02/2020)   thiamine (VITAMIN B-1) 100 MG tablet, Take 100 mg by mouth daily.   tiZANidine (ZANAFLEX) 2 MG tablet, TAKE 1 TABLET BY MOUTH EVERYDAY AT BEDTIME (Patient not taking: No sig reported)    zolpidem (AMBIEN) 10 MG tablet, Take 10 mg by mouth at bedtime.   Reviewed prior external information including notes and imaging from  primary care provider As well as notes that were available from care everywhere and other healthcare systems.  Past medical history, social, surgical and family history all reviewed in electronic medical record.  No pertanent information unless stated regarding to the chief complaint.   Review of Systems:  No headache, visual changes, nausea, vomiting, diarrhea, constipation, dizziness, abdominal pain, skin rash, fevers, chills, night sweats, weight loss, swollen lymph nodes, body aches, joint swelling, chest pain, shortness of breath, mood changes. POSITIVE muscle aches  Objective  Last menstrual period 08/16/2010.   General: No apparent distress alert and oriented x3 mood and affect normal, dressed appropriately.  HEENT: Pupils equal, extraocular movements intact  Respiratory: Patient's speak in full sentences and does not appear short of breath  Cardiovascular: No lower extremity edema, non tender, no erythema  Gait normal with good balance and coordination.  MSK:  Non tender with full range of motion and good stability and symmetric strength and tone of shoulders, elbows, wrist, hip, knee and ankles bilaterally.     Impression and Recommendations:     The above documentation has been reviewed and is accurate and complete Jacqualin Combes

## 2021-01-04 ENCOUNTER — Ambulatory Visit: Payer: Medicare Other | Admitting: Family Medicine

## 2021-01-06 ENCOUNTER — Other Ambulatory Visit: Payer: Self-pay | Admitting: Internal Medicine

## 2021-01-11 ENCOUNTER — Other Ambulatory Visit: Payer: Self-pay

## 2021-01-11 ENCOUNTER — Ambulatory Visit: Payer: Medicare Other | Attending: Family Medicine | Admitting: Physical Therapy

## 2021-01-11 ENCOUNTER — Encounter: Payer: Self-pay | Admitting: Physical Therapy

## 2021-01-11 DIAGNOSIS — R42 Dizziness and giddiness: Secondary | ICD-10-CM | POA: Insufficient documentation

## 2021-01-11 DIAGNOSIS — M542 Cervicalgia: Secondary | ICD-10-CM | POA: Diagnosis not present

## 2021-01-11 DIAGNOSIS — M6281 Muscle weakness (generalized): Secondary | ICD-10-CM | POA: Diagnosis not present

## 2021-01-11 DIAGNOSIS — R2681 Unsteadiness on feet: Secondary | ICD-10-CM | POA: Insufficient documentation

## 2021-01-11 NOTE — Patient Instructions (Signed)
Access Code: [email or web address] URL: [email or web address] Date: 01/11/2021 Prepared by: Almyra Free  Exercises Supine Chin Tuck - 3 x daily - 7 x weekly - 1 sets - 10 reps - 5 sec hold

## 2021-01-11 NOTE — Therapy (Signed)
[facility] @ [place], [place], [address] Phone:     Fax:     Physical Therapy Evaluation  Patient Details  Name: Kristen [name] MRN: [id number] Date of Birth: 1960/12/19 Referring Provider (PT): Hulan Saas DO   Encounter Date: 01/11/2021   PT End of Session - 01/11/21 0945     Visit Number 1    Date for PT Re-Evaluation 03/08/21    Authorization Type MCR    Progress Note Due on Visit 10    PT Start Time 0945    PT Stop Time 1020    PT Time Calculation (min) 35 min    Activity Tolerance Patient tolerated treatment well;Other (comment)   see observation re: dizziness   Behavior During Therapy WFL for tasks assessed/performed             Past Medical History:  Diagnosis Date   Anxiety    Depression    Hair loss    Insomnia    Migraine    Post-surgical hypothyroidism    ?cancer    Past Surgical History:  Procedure Laterality Date   EYE SURGERY     SHOULDER ARTHROSCOPY WITH SUBACROMIAL DECOMPRESSION Right 04/11/2020   Procedure: SHOULDER ARTHROSCOPY WITH SUBACROMIAL DECOMPRESSION;  Surgeon: Tania Ade, MD;  Location: [facility];  Service: Orthopedics;  Laterality: Right;    There were no vitals filed for this visit.    Subjective Assessment - 01/11/21 0946     Subjective Patient presents with c/o of neck and lower extremity weakness. I've always had neck problems. 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 It's good during the day. At night it bothers me (indicates bil UT). She sees a chiropractor 1x/wk or every 2 wks, mostly for low back pain. takes muscle relaxors and sleep aid. Two months ago she was getting something out of cabinet. Her leg gave out and she fell backwards and hit the back of her head on the counter. About a month later she started getting bad headaches. CT at chiropractor showed some damage to  neck but not severe. Getting injections for her HA. Her next one is on 01/17/21. They last a month. She states she would like to get more movement in her neck and she feels weak in her legs to where she can't get up from the floor. She used to walk around the neighborhood, but she's afraid to walk that far due to weakness. She's undergoing testing to see if she has neuropathy. She has N/T in her lower legs, knees and elbows. Her husband has to help her out of the bath every night. Very stressed right now. Brain feels really foggy and the past few days she has had some dizziness.    Pertinent History anxiety, depression, right RCR    Diagnostic tests xrays, CT, MRI's negative per pt    Patient Stated Goals more movement in her neck    Currently in Pain? No/denies                [facility] PT Assessment - 01/11/21 0001       Assessment   Medical Diagnosis neck pain    Referring Provider (PT) Hulan Saas DO    Onset Date/Surgical Date 11/16/20    Hand Dominance Right    Prior Therapy yes for shoulder      Precautions  Precautions None      Restrictions   Weight Bearing Restrictions No      Balance Screen   Has the patient fallen in the past 6 months No   she fell of chair see subjective   Has the patient had a decrease in activity level because of a fear of falling?  Yes    Is the patient reluctant to leave their home because of a fear of falling?  No      Home Ecologist residence    Living Arrangements Spouse/significant other      Prior Function   Level of Independence Independent with basic ADLs    Vocation Retired    Leisure walking      Observation/Other Assessments   Observations dizziness and nystagmus occurs with supine to left SDLY; some dizziness with transition from Rt SDLY to supine and supine to sit      Posture/Postural Control   Posture/Postural Control Postural limitations    Postural Limitations Rounded Shoulders;Forward  head;Decreased thoracic kyphosis    Posture Comments increased kyphosis at CT junction      ROM / Strength   AROM / PROM / Strength AROM;Strength      AROM   Overall AROM Comments lumbar WNL mild tightness with rotaion: legs shake with forward flexion    AROM Assessment Site Cervical    Cervical Flexion full    Cervical Extension 28    Cervical - Right Side Bend 24    Cervical - Left Side Bend 29    Cervical - Right Rotation 56    Cervical - Left Rotation 59      Strength   Overall Strength Comments Neck flexion 4-/5; deep neck flex 5/5, else cervical 5/5; Left hip 5/5 except seated hip flex 3+/5; bil DF 5/5    Strength Assessment Site Hip;Knee    Right/Left Hip Right    Right Hip Flexion 4+/5    Right Hip Extension 5/5    Right Hip ABduction 4+/5    Right/Left Knee Right;Left    Right Knee Flexion 4+/5    Right Knee Extension 5/5    Left Knee Flexion 4-/5    Left Knee Extension 5/5      Palpation   Palpation comment left UT trigger points, right side tender, tight suboccipitals; decreased ROM with retraction                        Objective measurements completed on examination: See above findings.                PT Education - 01/11/21 1415     Education Details HEP - supine chin tuck cues to avoid using jaw vs. neck    Person(s) Educated Patient    Methods Explanation;Demonstration;Tactile cues;Verbal cues;Handout    Comprehension Verbalized understanding;Returned demonstration              PT Short Term Goals - 01/11/21 1417       PT SHORT TERM GOAL #1   Title Ind with initial HEP    Status New    Target Date 01/25/21      PT SHORT TERM GOAL #2   Title Establish LTGs for LE weakness if approved by MD    Status New               PT Long Term Goals - 01/11/21 1418       PT LONG TERM  GOAL #1   Title Patient able to move her neck without difficulty and pain.    Time 8    Period Weeks    Status New    Target Date  03/08/21      PT LONG TERM GOAL #2   Title Patient able to look up with full neck extension and no dizziness.    Time 8    Period Weeks    Status New      PT LONG TERM GOAL #3   Title Pt independent with advanced HEP    Time 8    Period Weeks    Status New                    Plan - 01/11/21 1401     Clinical Impression Statement Patient was late due to clinic change. She presents today with complaints mainly of LE weakness, dizziness and also some neck pain s/p a fall from standing on a chair 2 months ago. She hit the back of her head on the counter. Imaging has been negative since. She sees a chiropractor weakly who is addressing her low back mainly where she also has intermittent sciatica. She had decreased cervical extension and experiences dizziness with ext as well. She has tigntness in bil UT with trigger points present on the left. She is tight in bil suboccipitals and is receiving injections for HA once/month. She has decreased cervical retraction also. She had two incidences of dizziness with transfers and also had nystagmus with supine to left SDLY. She would benefit from a vestibular evaluation as well. She has significant weakness in bil hip flexors in sitting, weakness in right hip ABD and bil knee flex. She reports she is unable to walk the neighborhood independently now and must hold onto her husband. He also has to help her out of the tub daily. She reports that she is "very stressed" since the fall. She would benefit from PT to address these deficits.    Personal Factors and Comorbidities Comorbidity 3+    Comorbidities anxiety, depression, dizziness    Examination-Activity Limitations Carry;Stand;Locomotion Level    Stability/Clinical Decision Making Evolving/Moderate complexity    Clinical Decision Making Moderate    Rehab Potential Excellent    PT Frequency 2x / week    PT Duration 8 weeks    PT Treatment/Interventions ADLs/Self Care Home Management;Aquatic  Therapy;Cryotherapy;Electrical Stimulation;Moist Heat;Traction;Gait Scientist, forensic;Therapeutic activities;Therapeutic exercise;Balance training;Neuromuscular re-education;Manual techniques;Patient/family education;Dry needling;Joint Manipulations;Spinal Manipulations    PT Next Visit Plan postural and cervical strength, OA release/DN to UT, suboccipitals; if approved by MD: set LE strength goals based on functional deficits; work on LE strength; monitor dizziness; requesting vestibular eval    PT Home Exercise Plan [id number]    Consulted and Agree with Plan of Care Patient             [name]  Decreased range of motion, Pain, Dizziness, Decreased strength, Postural dysfunction, Impaired flexibility, Increased muscle spasms  Visit Diagnosis: Cervicalgia - Plan: PT plan of care cert/re-cert  Muscle weakness (generalized) - Plan: PT plan of care cert/re-cert  Dizziness and giddiness - Plan: PT plan of care cert/re-cert     Problem List Patient Active Problem List   Diagnosis Date Noted   Posttraumatic headache 11/24/2020   Insomnia 06/21/2020   Major depression, single episode 06/21/2020   Migraine 06/21/2020   Posttraumatic stress disorder 06/21/2020  Anxiety 06/21/2020   Allergic rhinitis 06/21/2020   Hyperacusis of both ears 05/17/2020   Laryngopharyngeal reflux (LPR) 05/17/2020   Right rotator cuff tear 03/17/2020   Excessive physiologic tremor 02/11/2015   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

## 2021-01-13 ENCOUNTER — Ambulatory Visit: Payer: Medicare Other | Admitting: Physical Therapy

## 2021-01-13 ENCOUNTER — Other Ambulatory Visit: Payer: Self-pay

## 2021-01-13 DIAGNOSIS — M542 Cervicalgia: Secondary | ICD-10-CM | POA: Diagnosis not present

## 2021-01-13 DIAGNOSIS — M6281 Muscle weakness (generalized): Secondary | ICD-10-CM

## 2021-01-13 DIAGNOSIS — R42 Dizziness and giddiness: Secondary | ICD-10-CM

## 2021-01-13 NOTE — Therapy (Signed)
[facility] @ [place], [place], [address] Phone: [phone]   Fax:  [phone]  Physical Therapy Treatment  Patient Details  Name: Kristen [name] MRN: [id number] Date of Birth: 1960-05-19 Referring Provider (PT): Hulan Saas DO   Encounter Date: 01/13/2021   PT End of Session - 01/13/21 1206     Visit Number 2    Date for PT Re-Evaluation 03/08/21    Authorization Type MCR    Progress Note Due on Visit 10    PT Start Time 0949   late   PT Stop Time 1016    PT Time Calculation (min) 27 min    Activity Tolerance Patient tolerated treatment well             Past Medical History:  Diagnosis Date   Anxiety    Depression    Hair loss    Insomnia    Migraine    Post-surgical hypothyroidism    ?cancer    Past Surgical History:  Procedure Laterality Date   EYE SURGERY     SHOULDER ARTHROSCOPY WITH SUBACROMIAL DECOMPRESSION Right 04/11/2020   Procedure: SHOULDER ARTHROSCOPY WITH SUBACROMIAL DECOMPRESSION;  Surgeon: Tania Ade, MD;  Location: [facility];  Service: Orthopedics;  Laterality: Right;    There were no vitals filed for this visit.   Subjective Assessment - 01/13/21 0951     Subjective I got some bad news yesterday, I have neuropathy in my legs, arms and face.  I go tomorrow to see if I have diabetes.  Sees chiro for neck and back.  I fell the other day b/c right leg gave out.  Has abrasion on left leg.    Pertinent History anxiety, depression, right RCR    Diagnostic tests xrays, CT, MRI's negative per pt    Patient Stated Goals more movement in her neck    Currently in Pain? No/denies                [facility] PT Assessment - 01/13/21 0001       Strength   Overall Strength Comments Unable to rise from standard chair without mod assist of hands on thighs      Standardized Balance Assessment   Five times sit to stand comments  35 sec 4x with mod UE use on  thighs      Berg Balance Test   Sit to Stand Able to stand  independently using hands    Standing Unsupported Able to stand safely 2 minutes    Sitting with Back Unsupported but Feet Supported on Floor or Stool Able to sit safely and securely 2 minutes    Stand to Sit Controls descent by using hands    Transfers Able to transfer safely, definite need of hands    Standing Unsupported with Eyes Closed Able to stand 10 seconds safely    Standing Unsupported with Feet Together Able to place feet together independently and stand 1 minute safely    From Standing, Reach Forward with Outstretched Arm Can reach confidently >25 cm (10")    From Standing Position, Pick up Object from Floor Able to pick up shoe safely and easily    From Standing Position, Turn to Look Behind Over each Shoulder Looks behind from both sides and weight shifts well    Turn 360 Degrees Able to turn 360 degrees safely in 4 seconds or less    Standing Unsupported, Alternately Place Feet on Step/Stool Able to  stand independently and complete 8 steps >20 seconds    Standing Unsupported, One Foot in [place] to place foot tandem independently and hold 30 seconds    Standing on One Leg Able to lift leg independently and hold 5-10 seconds    Total Score 51      Timed Up and Go Test   Normal TUG (seconds) 13   with UE assist to rise     Functional Gait  Assessment   Gait Level Surface Walks 20 ft in less than 5.5 sec, no assistive devices, good speed, no evidence for imbalance, normal gait pattern, deviates no more than 6 in outside of the 12 in walkway width.    Change in Gait Speed Able to smoothly change walking speed without loss of balance or gait deviation. Deviate no more than 6 in outside of the 12 in walkway width.    Gait with Horizontal Head Turns Performs head turns smoothly with no change in gait. Deviates no more than 6 in outside 12 in walkway width    Gait with Vertical Head Turns Performs task with slight change  in gait velocity (eg, minor disruption to smooth gait path), deviates 6 - 10 in outside 12 in walkway width or uses assistive device    Gait and Pivot Turn Pivot turns safely within 3 sec and stops quickly with no loss of balance.    Step Over Obstacle Is able to step over one shoe box (4.5 in total height) without changing gait speed. No evidence of imbalance.    Gait with Narrow Base of Support Ambulates 7-9 steps.    Gait with Eyes Closed Walks 20 ft, uses assistive device, slower speed, mild gait deviations, deviates 6-10 in outside 12 in walkway width. Ambulates 20 ft in less than 9 sec but greater than 7 sec.    Ambulating Backwards Walks 20 ft, uses assistive device, slower speed, mild gait deviations, deviates 6-10 in outside 12 in walkway width.    Steps Two feet to a stair, must use rail.    Total Score 23                                      PT Short Term Goals - 01/13/21 1229       PT SHORT TERM GOAL #1   Title Ind with initial HEP    Target Date 01/25/21      PT SHORT TERM GOAL #2   Title Able to rise from a chair 1x without UE assist    Status New    Target Date 01/25/21      PT SHORT TERM GOAL #3   Title 5x sit to stand with min UE use in 30 sec    Time 0    Status New               PT Long Term Goals - 01/13/21 1221       PT LONG TERM GOAL #1   Title Patient able to move her neck without difficulty and pain.    Time 8    Period Weeks    Status New      PT LONG TERM GOAL #2   Title Patient able to look up with full neck extension and no dizziness.    Time 8    Period Weeks    Status New      PT LONG  TERM GOAL #3   Title Pt independent with advanced HEP    Time 8    Period Weeks    Status New      PT LONG TERM GOAL #4   Title Functional Gait Assessment improved to 26/30    Time 8    Period Weeks    Status New      PT LONG TERM GOAL #5   Title 5x sit to stand without UE use 25 sec    Time 8    Period Weeks     Status New                   Plan - 01/13/21 1207     Clinical Impression Statement The patient has multiple issues including new diagnosis of multi regional neuropathy in addition to chronic neck pain, frequent falls related to bil LE weakness and giveway and dizziness.  Performed multiple balance and functional tests today which indicate inadequate LE strength needed to rise from a chair without UE assist.  She is only able to complete 4 reps of sit to stand (too fatigued to complete 5 rep test) and needs mod assist on thighs to rise.  BERG score 51/56 with difficulty single limb standing but overall static balance is good.  Functional Gait Assessent is 23/30 with staggering with obstacle negotiation  and significant use of railings to ascend and descend stairs.  Verbal order has been received for vestibular assessment to be completed at a later date.  Therapist providing CGA for safety during treatment session today.    Comorbidities anxiety, depression, dizziness    Stability/Clinical Decision Making Evolving/Moderate complexity    Rehab Potential Excellent    PT Frequency 2x / week    PT Duration 8 weeks    PT Treatment/Interventions ADLs/Self Care Home Management;Aquatic Therapy;Cryotherapy;Electrical Stimulation;Moist Heat;Traction;Gait Scientist, forensic;Therapeutic activities;Therapeutic exercise;Balance training;Neuromuscular re-education;Manual techniques;Patient/family education;Dry needling;Joint Manipulations;Spinal Manipulations    PT Next Visit Plan LE strengthening: Nu-Step; step ups, leg press, sit to stands, SLS (recommend gait belt secondary to history of right LE giveway); postural and cervical strength, OA release/DN to UT, suboccipitals;  vestibular eval verbal order has been signed by Dr. Tamala Julian and can be completed in mid Oct    [place]             Patient will benefit from skilled therapeutic intervention in order to improve the  following deficits and impairments:  Decreased range of motion, Pain, Dizziness, Decreased strength, Postural dysfunction, Impaired flexibility, Increased muscle spasms  Visit Diagnosis: Cervicalgia  Muscle weakness (generalized)  Dizziness and giddiness     Problem List Patient Active Problem List   Diagnosis Date Noted   Posttraumatic headache 11/24/2020   Insomnia 06/21/2020   Major depression, single episode 06/21/2020   Migraine 06/21/2020   Posttraumatic stress disorder 06/21/2020   Anxiety 06/21/2020   Allergic rhinitis 06/21/2020   Hyperacusis of both ears 05/17/2020   Laryngopharyngeal reflux (LPR) 05/17/2020   Right rotator cuff tear 03/17/2020   Excessive physiologic tremor 02/11/2015   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

## 2021-01-16 ENCOUNTER — Other Ambulatory Visit: Payer: Self-pay

## 2021-01-16 ENCOUNTER — Ambulatory Visit: Payer: Medicare Other | Admitting: Physical Therapy

## 2021-01-16 DIAGNOSIS — R42 Dizziness and giddiness: Secondary | ICD-10-CM

## 2021-01-16 DIAGNOSIS — M542 Cervicalgia: Secondary | ICD-10-CM

## 2021-01-16 DIAGNOSIS — M6281 Muscle weakness (generalized): Secondary | ICD-10-CM

## 2021-01-16 NOTE — Therapy (Signed)
[facility] @ [place], [place], [address] Phone: [phone]   Fax:  [phone]  Physical Therapy Treatment  Patient Details  Name: Kristen [name] MRN: [id number] Date of Birth: 1961/03/28 Referring Provider (PT): Hulan Saas DO   Encounter Date: 01/16/2021   PT End of Session - 01/16/21 0940     Visit Number 3    Date for PT Re-Evaluation 03/08/21    Authorization Type MCR    Progress Note Due on Visit 10    PT Start Time 0937    PT Stop Time 1015    PT Time Calculation (min) 38 min    Activity Tolerance Patient tolerated treatment well    Behavior During Therapy [facility] for tasks assessed/performed             Past Medical History:  Diagnosis Date   Anxiety    Depression    Hair loss    Insomnia    Migraine    Post-surgical hypothyroidism    ?cancer    Past Surgical History:  Procedure Laterality Date   EYE SURGERY     SHOULDER ARTHROSCOPY WITH SUBACROMIAL DECOMPRESSION Right 04/11/2020   Procedure: SHOULDER ARTHROSCOPY WITH SUBACROMIAL DECOMPRESSION;  Surgeon: Tania Ade, MD;  Location: [facility];  Service: Orthopedics;  Laterality: Right;    There were no vitals filed for this visit.   Subjective Assessment - 01/16/21 0939     Subjective Still waiting on my results regarding diabetes diagnosis. No pain right now but I feel weak and tired.    Pertinent History anxiety, depression, right RCR    Diagnostic tests xrays, CT, MRI's negative per pt    Currently in Pain? No/denies                               [facility] Adult PT Treatment/Exercise - 01/16/21 0001       Self-Care   Self-Care Posture      Neck Exercises: Machines for Strengthening   UBE (Upper Arm Bike) Reverse L1 with towel roll for thoracic extension 4 min      Neck Exercises: Standing   Other Standing Exercises review of Pt's home band exercises she does with You Tube video.  Good demo      Neck Exercises: Supine   Neck Retraction --   Supine , VC for technique 5x, VC to do with ease     Knee/Hip Exercises: Aerobic   Nustep L1 x 6 min with PTA present to discuss status      Knee/Hip Exercises: Machines for Strengthening   Cybex Leg Press Seat 5 Bil 50# 6x      Knee/Hip Exercises: Standing   Forward Step Up Both;1 set;5 reps;Hand Hold: 2;Step Height: 6"    Forward Step Up Limitations Added to HEP      Knee/Hip Exercises: Seated   Sit to Sand --   2 blue mats No UE 10x                      PT Short Term Goals - 01/16/21 0953       PT SHORT TERM GOAL #1   Title Ind with initial HEP    Status New    Target Date 01/25/21               PT Long Term Goals - 01/13/21 1221  PT LONG TERM GOAL #1   Title Patient able to move her neck without difficulty and pain.    Time 8    Period Weeks    Status New      PT LONG TERM GOAL #2   Title Patient able to look up with full neck extension and no dizziness.    Time 8    Period Weeks    Status New      PT LONG TERM GOAL #3   Title Pt independent with advanced HEP    Time 8    Period Weeks    Status New      PT LONG TERM GOAL #4   Title Functional Gait Assessment improved to 26/30    Time 8    Period Weeks    Status New      PT LONG TERM GOAL #5   Title 5x sit to stand without UE use 25 sec    Time 8    Period Weeks    Status New                   Plan - 01/16/21 0940     Clinical Impression Statement Pt arrives to PT pain free but "tired and weak." Step ups were added to HEP today for LE. Pt could perform sit to stand 10x off 2 pads without UE. Pt reports no dizziness in 2 weeks. Pt also does pretty comprehensive You Tube video with tbands for postural strength and endurance. No adverse effects during todays treatment.    Personal Factors and Comorbidities Comorbidity 3+    Comorbidities anxiety, depression, dizziness    Examination-Activity Limitations  Carry;Stand;Locomotion Level    Stability/Clinical Decision Making Evolving/Moderate complexity    Rehab Potential Excellent    PT Frequency 2x / week    PT Duration 8 weeks    PT Treatment/Interventions ADLs/Self Care Home Management;Aquatic Therapy;Cryotherapy;Electrical Stimulation;Moist Heat;Traction;Gait Scientist, forensic;Therapeutic activities;Therapeutic exercise;Balance training;Neuromuscular re-education;Manual techniques;Patient/family education;Dry needling;Joint Manipulations;Spinal Manipulations    PT Next Visit Plan When seen by PT complete vestibular assessment, continue with sit to stand/LE strength exercises. Look to progress pt's current You Tube Video routine for postural strength.    PT Home Exercise Plan [id number]    Consulted and Agree with Plan of Care Patient             [name]  Decreased range of motion, Pain, Dizziness, Decreased strength, Postural dysfunction, Impaired flexibility, Increased muscle spasms  Visit Diagnosis: Cervicalgia  Muscle weakness (generalized)  Dizziness and giddiness     Problem List Patient Active Problem List   Diagnosis Date Noted   Posttraumatic headache 11/24/2020   Insomnia 06/21/2020   Major depression, single episode 06/21/2020   Migraine 06/21/2020   Posttraumatic stress disorder 06/21/2020   Anxiety 06/21/2020   Allergic rhinitis 06/21/2020   Hyperacusis of both ears 05/17/2020   Laryngopharyngeal reflux (LPR) 05/17/2020   Right rotator cuff tear 03/17/2020   Excessive physiologic tremor 02/11/2015   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

## 2021-01-17 NOTE — Progress Notes (Signed)
[place] Phone: [phone] Subjective:   Kristen [name], am serving as a scribe for Dr. Hulan [name]. This visit occurred during the SARS-CoV-2 public health emergency.  Safety protocols were in place, including screening questions prior to the visit, additional usage of staff PPE, and extensive cleaning of exam room while observing appropriate contact time as indicated for disinfecting solutions.   I'm seeing this patient by the request  of:  Kristen Bruins, MD  CC: Headache follow-up  [id number]  8//18/2022 Posttraumatic headache.  Discussed over-the-counter medicines.  I do feel that patient's anxiety is giving her difficulty.  Having significant pain with vestibular neuro with some mild dizziness and we will have patient start with formal physical therapy.  We will get laboratory work-up to see if anything is contributing to some of patient's other symptoms.  Follow-up with me again 6 to 8 weeks.  Patient knows if any worsening pain to seek medical attention immediately.  We did discuss patient's anxiety seems to be exacerbating some of the symptoms and patient will monitor this as well.  Update 01/18/2021 Kristen [name] is a 60 y.o. adult coming in with complaint of neck pain. Patient states that she is improving. Has done only one session of PT thus far. Using headache medicine which is decreasing frequency of headaches.   Cervical xray 11/24/2020 IMPRESSION: Minimal degenerative change.  Otherwise negative     Past Medical History:  Diagnosis Date   Anxiety    Depression    Hair loss    Insomnia    Migraine    Post-surgical hypothyroidism    ?cancer   Past Surgical History:  Procedure Laterality Date   EYE SURGERY     SHOULDER ARTHROSCOPY WITH SUBACROMIAL DECOMPRESSION Right 04/11/2020   Procedure: SHOULDER ARTHROSCOPY WITH SUBACROMIAL DECOMPRESSION;  Surgeon: Kristen Ade, MD;   Location: [facility];  Service: Orthopedics;  Laterality: Right;   Social History   Socioeconomic History   Marital status: Married    Spouse name: Not on file   Number of children: 1   Years of education: Not on file   Highest education level: Not on file  Occupational History    Comment: unemployed  Tobacco Use   Smoking status: Never   Smokeless tobacco: Never  Substance and Sexual Activity   Alcohol use: Yes    Alcohol/week: 0.0 standard drinks    Comment: rarely   Drug use: [name]   Sexual activity: Not on file  Other Topics Concern   Not on file  Social History Narrative   Lives with husband.   Drinks only water. Denies caffeine use.   Social Determinants of Health   Financial Resource Strain: Not on file  Food Insecurity: Not on file  Transportation Needs: Not on file  Physical Activity: Not on file  Stress: Not on file  Social Connections: Not on file   Allergies  Allergen Reactions   Benadryl [Diphenhydramine Hcl] Other (See Comments)    Makes her skin crawl and she gets hyped up    Family History  Problem Relation Age of Onset   Hypertension Father    Heart disease Father    Healthy Mother     Current Outpatient Medications (Endocrine & Metabolic):    levothyroxine (SYNTHROID) 100 MCG tablet, TAKE 1 TABLET BY MOUTH EVERY DAY   levothyroxine (SYNTHROID) 88 MCG tablet, TAKE 1 TABLET BY MOUTH EVERY DAY BEFORE BREAKFAST   Current Outpatient  Medications (Respiratory):    fexofenadine (ALLEGRA) 180 MG tablet, Allegra All[facility]   fluticasone (FLONASE) 50 MCG/ACT nasal spray, Place 1 spray into both nostrils daily.  Current Outpatient Medications (Analgesics):    meloxicam (MOBIC) 7.5 MG tablet, Take 1 tablet (7.5 mg total) by mouth daily.   Current Outpatient Medications (Other):    acyclovir (ZOVIRAX) 800 MG tablet, Take 800 mg by mouth daily.   dicyclomine (BENTYL) 20 MG tablet, Take 20 mg by mouth every 8 (eight) hours as needed.    pantoprazole (PROTONIX) 40 MG tablet, Take 40 mg by mouth daily.   sertraline (ZOLOFT) 100 MG tablet, Take 200 mg by mouth daily.   thiamine (VITAMIN B-1) 100 MG tablet, Take 100 mg by mouth daily.   tiZANidine (ZANAFLEX) 2 MG tablet, TAKE 1 TABLET BY MOUTH EVERYDAY AT BEDTIME   zolpidem (AMBIEN) 10 MG tablet, Take 10 mg by mouth at bedtime.   gabapentin (NEURONTIN) 300 MG capsule, Take 300 mg by mouth 3 (three) times daily.   LORazepam (ATIVAN) 0.5 MG tablet, Take 0.5 mg by mouth every 8 (eight) hours.   Reviewed prior external information including notes and imaging from  primary care provider reviewed some of the other office visits and patient has seen her primary care for back pain as well as the physical therapy notes were reviewed. As well as notes that were available from care everywhere and other healthcare systems.  Past medical history, social, surgical and family history all reviewed in electronic medical record.  [name] pertanent information unless stated regarding to the chief complaint.   Review of Systems:  [name] headache, visual changes, nausea, vomiting, diarrhea, constipation, dizziness, abdominal pain, fevers, chills, night sweats, weight loss, swollen lymph nodes, , joint swelling, chest pain, shortness of breath, mood changes. POSITIVE muscle aches, body aches, skin rashes intermittently  Objective  Blood pressure (!) 138/102, pulse 86, height 5\' 3"  (1.6 m), weight 123 lb (55.8 kg), last menstrual period 08/16/2010, SpO2 99 %.   General: [name] apparent distress patient is quite anxious HEENT: Pupils equal, extraocular movements intact  Respiratory: Patient's speak in full sentences and does not appear short of breath  Cardiovascular: [name] lower extremity edema, non tender, [name] erythema  Gait normal with good balance and coordination.  MSK: Patient does have tightness noted in multiple different areas.  Neck exam does have significant improvement in range of motion from previous  exam.  Negative Spurling's.  5 out of 5 strength of the upper extremities.  Still some limited sidebending bilaterally.  Patient does have some mild pain.  Mild guarding noted    Impression and Recommendations:    The above documentation has been reviewed and is accurate and complete Lyndal Pulley, DO

## 2021-01-18 ENCOUNTER — Ambulatory Visit: Payer: Medicare Other | Admitting: Rehabilitative and Restorative Service Providers"

## 2021-01-18 ENCOUNTER — Encounter: Payer: Self-pay | Admitting: Rehabilitative and Restorative Service Providers"

## 2021-01-18 ENCOUNTER — Ambulatory Visit (INDEPENDENT_AMBULATORY_CARE_PROVIDER_SITE_OTHER): Payer: Medicare Other | Admitting: Family Medicine

## 2021-01-18 ENCOUNTER — Other Ambulatory Visit: Payer: Self-pay

## 2021-01-18 ENCOUNTER — Encounter: Payer: Self-pay | Admitting: Family Medicine

## 2021-01-18 DIAGNOSIS — R42 Dizziness and giddiness: Secondary | ICD-10-CM

## 2021-01-18 DIAGNOSIS — G44309 Post-traumatic headache, unspecified, not intractable: Secondary | ICD-10-CM | POA: Diagnosis not present

## 2021-01-18 DIAGNOSIS — R2681 Unsteadiness on feet: Secondary | ICD-10-CM

## 2021-01-18 DIAGNOSIS — R2689 Other abnormalities of gait and mobility: Secondary | ICD-10-CM

## 2021-01-18 DIAGNOSIS — M542 Cervicalgia: Secondary | ICD-10-CM | POA: Diagnosis not present

## 2021-01-18 DIAGNOSIS — M6281 Muscle weakness (generalized): Secondary | ICD-10-CM

## 2021-01-18 NOTE — Therapy (Signed)
[facility] @ [place], [place], [address] Phone: [phone]   Fax:  [phone]  Physical Therapy Treatment/Vestibular Eval  Patient Details  Name: Kristen [name] MRN: [id number] Date of Birth: 08/02/1960 Referring Provider (PT): Hulan Saas DO   Encounter Date: 01/18/2021   PT End of Session - 01/18/21 1346     Visit Number 4    Date for PT Re-Evaluation 03/10/21    Authorization Type MCR    Progress Note Due on Visit 10    PT Start Time 1100    PT Stop Time 1140    PT Time Calculation (min) 40 min    Activity Tolerance Patient tolerated treatment well    Behavior During Therapy [facility] for tasks assessed/performed             Past Medical History:  Diagnosis Date   Anxiety    Depression    Hair loss    Insomnia    Migraine    Post-surgical hypothyroidism    ?cancer    Past Surgical History:  Procedure Laterality Date   EYE SURGERY     SHOULDER ARTHROSCOPY WITH SUBACROMIAL DECOMPRESSION Right 04/11/2020   Procedure: SHOULDER ARTHROSCOPY WITH SUBACROMIAL DECOMPRESSION;  Surgeon: Tania Ade, MD;  Location: [facility];  Service: Orthopedics;  Laterality: Right;    There were no vitals filed for this visit.   Subjective Assessment - 01/18/21 1222     Subjective My appointment with Dr Tamala Julian went okay.  I am just tired today.    Currently in Pain? Yes    Pain Score 2     Pain Location Neck    Pain Orientation Lower    Pain Descriptors / Indicators Tightness    Pain Radiating Towards into B shoulers                [facility] PT Assessment - 01/18/21 0001       Assessment   Medical Diagnosis neck pain and dizziness    Referring Provider (PT) Hulan Saas DO    Onset Date/Surgical Date 11/16/20    Hand Dominance Right      Balance Screen   Has the patient fallen in the past 6 months Yes    How many times? 5    Has the patient had a decrease in activity level  because of a fear of falling?  Yes    Is the patient reluctant to leave their home because of a fear of falling?  Yes      [place] residence    Living Arrangements Spouse/significant other    [place] to enter    Entrance Stairs-Rails None   reports husband is going to install railings soon.   Home Layout One level      Prior Function   Level of Independence Independent with basic ADLs    Vocation Retired    Leisure walking      Cognition   Overall Cognitive Status Within Functional Limits for tasks assessed                 Vestibular Assessment - 01/18/21 0001       Symptom Behavior   Subjective history of current problem Pt reports that she started having dizziness years ago, but it has been intermittent.  She has had chiropractic care for dizziness in the past.  Pt states that this bought of dizziness has been happening for  approx one month.    Type of Dizziness  Spinning    Symptom Nature Positional;Motion provoked    Aggravating Factors Looking up to the ceiling;Rolling to left      Oculomotor Exam   Oculomotor Alignment Normal    Ocular ROM WFL    Spontaneous Absent    Gaze-induced  Absent    Head shaking Horizontal L beating nystagmus    Smooth Pursuits Intact    Saccades Intact                       Vestibular Treatment/Exercise - 01/18/21 0001       Vestibular Treatment/Exercise   Vestibular Treatment Provided Canalith Repositioning    Canalith Repositioning Epley Manuever Left    Habituation Exercises Brandt Daroff       EPLEY MANUEVER LEFT   Number of Reps  2    Overall Response  Improved Symptoms     RESPONSE DETAILS LEFT Decreased nystagmus noted on second rep      Nestor Lewandowsky   Number of Reps  1    Symptom Description  Postive nystagmus on L side                    PT Education - 01/18/21 1229     Education Details Provided Nestor Lewandowsky    Person(s) Educated  Patient    Methods Explanation    Comprehension Verbalized understanding;Returned demonstration              PT Short Term Goals - 01/18/21 1334       PT SHORT TERM GOAL #1   Title Ind with initial HEP    Status Partially Met      PT SHORT TERM GOAL #2   Title Able to rise from a chair 1x without UE assist    Status Partially Met      PT SHORT TERM GOAL #3   Title Pt to have negative Dix-Hallpike to bilat sides.    Time 4    Period Weeks    Status New               PT Long Term Goals - 01/18/21 1335       PT LONG TERM GOAL #1   Title Patient able to move her neck without difficulty and pain.    Status On-going      PT LONG TERM GOAL #2   Title Patient able to look up with full neck extension and no dizziness.    Status On-going      PT LONG TERM GOAL #3   Title Pt independent with advanced HEP    Status On-going      PT LONG TERM GOAL #4   Title Functional Gait Assessment improved to 26/30    Status On-going      PT LONG TERM GOAL #5   Title 5x sit to stand without UE use 25 sec    Status On-going                   Plan - 01/18/21 1230     Clinical Impression Statement Ms Duncombe presents with a new referral from Dr Tamala Julian to allow assessment of dizziness and vestibular symptoms. Pt reports that she has been having dizziness especially when she looks up and when lying down and rolling on her side.  Pt with slight nstagmus noted with head thrust test, but had significant nystagmus with L Dix-Hallpike.  Proceeded with treatment with Epley Maneuver x2 rounds.  Pt did report a relief of some of her symptoms, but the dizziness had not completely subsided. Educated pt on Nestor Lewandowsky for HEP and had pt demonstrate in clinic. Pt would benefit from addition of vestibular rehab and cannalith repositioning strategies to address her dizziness and continue to progress patients independence and safety and decrease her risk of further falls.    Personal  Factors and Comorbidities Comorbidity 3+    Comorbidities anxiety, depression, dizziness    Examination-Activity Limitations Carry;Stand;Locomotion Level    Examination-Participation Restrictions Community Activity    Stability/Clinical Decision Making Evolving/Moderate complexity    Clinical Decision Making Moderate    Rehab Potential Good    PT Frequency 2x / week    PT Duration 8 weeks    PT Treatment/Interventions ADLs/Self Care Home Management;Aquatic Therapy;Cryotherapy;Electrical Stimulation;Moist Heat;Traction;Gait Scientist, forensic;Therapeutic activities;Therapeutic exercise;Balance training;Neuromuscular re-education;Manual techniques;Patient/family education;Dry needling;Joint Manipulations;Spinal Manipulations;Canalith Repostioning;Vestibular    PT Next Visit Plan Assess progress with [facility] HEP. Progress strengthening and balance.    PT Home Exercise Plan [id number] for strengthening and Access Code: [email or web address] for vestibular    Consulted and Agree with Plan of Care Patient             [name]  Decreased range of motion, Pain, Dizziness, Decreased strength, Postural dysfunction, Impaired flexibility, Increased muscle spasms  Visit Diagnosis: Dizziness and giddiness - Plan: PT plan of care cert/re-cert  Cervicalgia - Plan: PT plan of care cert/re-cert  Muscle weakness (generalized) - Plan: PT plan of care cert/re-cert  Balance problem - Plan: PT plan of care cert/re-cert  Unsteadiness on feet - Plan: PT plan of care cert/re-cert     Problem List Patient Active Problem List   Diagnosis Date Noted   Posttraumatic headache 11/24/2020   Insomnia 06/21/2020   Major depression, single episode 06/21/2020   Migraine 06/21/2020   Posttraumatic stress disorder 06/21/2020   Anxiety 06/21/2020   Allergic rhinitis 06/21/2020   Hyperacusis of both ears  05/17/2020   Laryngopharyngeal reflux (LPR) 05/17/2020   Right rotator cuff tear 03/17/2020   Excessive physiologic tremor 02/11/2015   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

## 2021-01-18 NOTE — Patient Instructions (Signed)
Keep doing PT Going to get better SI joint exercises See me in 6-8 weeks

## 2021-01-18 NOTE — Therapy (Deleted)
[facility] @ [place], [place], [address] Phone: [phone]   Fax:  [phone]  Physical Therapy Treatment/Vestibular Evaluation  Patient Details  Name: Kristen [name] MRN: [id number] Date of Birth: [birth year] Referring Provider (PT): Hulan Saas DO   Encounter Date: 01/18/2021    Past Medical History:  Diagnosis Date   Anxiety    Depression    Hair loss    Insomnia    Migraine    Post-surgical hypothyroidism    ?cancer    Past Surgical History:  Procedure Laterality Date   EYE SURGERY     SHOULDER ARTHROSCOPY WITH SUBACROMIAL DECOMPRESSION Right 04/11/2020   Procedure: SHOULDER ARTHROSCOPY WITH SUBACROMIAL DECOMPRESSION;  Surgeon: Tania Ade, MD;  Location: [facility];  Service: Orthopedics;  Laterality: Right;    There were no vitals filed for this visit.   Subjective Assessment - 01/18/21 1222     Subjective My appointment with Dr Tamala Julian went okay.  I am just tired today.    Currently in Pain? Yes    Pain Score 2     Pain Location Neck    Pain Orientation Lower    Pain Descriptors / Indicators Tightness    Pain Radiating Towards into B shoulers                [facility] PT Assessment - 01/18/21 0001       Assessment   Medical Diagnosis neck pain and dizziness    Referring Provider (PT) Hulan Saas DO    Onset Date/Surgical Date 11/16/20    Hand Dominance Right      Balance Screen   Has the patient fallen in the past 6 months Yes    How many times? 5    Has the patient had a decrease in activity level because of a fear of falling?  Yes    Is the patient reluctant to leave their home because of a fear of falling?  Yes      [place] residence    Living Arrangements Spouse/significant other    [place] to enter    Entrance Stairs-Rails None   reports husband is going to install railings soon.   Home Layout  One level      Prior Function   Level of Independence Independent with basic ADLs    Vocation Retired    Leisure walking      Cognition   Overall Cognitive Status Within Functional Limits for tasks assessed                 Vestibular Assessment - 01/18/21 0001       Symptom Behavior   Subjective history of current problem Pt reports that she started having dizziness years ago, but it has been intermittent.  She has had chiropractic care for dizziness in the past.  Pt states that this bought of dizziness has been happening for approx one month.    Type of Dizziness  Spinning    Symptom Nature Positional;Motion provoked    Aggravating Factors Looking up to the ceiling;Rolling to left      Oculomotor Exam   Oculomotor Alignment Normal    Ocular ROM WFL    Spontaneous Absent    Gaze-induced  Absent    Head shaking Horizontal L beating nystagmus    Smooth Pursuits Intact    Saccades Intact  Vestibular Treatment/Exercise - 01/18/21 0001       Vestibular Treatment/Exercise   Vestibular Treatment Provided Canalith Repositioning    Canalith Repositioning Epley Manuever Left    Habituation Exercises Brandt Daroff       EPLEY MANUEVER LEFT   Number of Reps  2    Overall Response  Improved Symptoms     RESPONSE DETAILS LEFT Decreased nystagmus noted on second rep      Nestor Lewandowsky   Number of Reps  1    Symptom Description  Postive nystagmus on L side                    PT Education - 01/18/21 1229     Education Details Provided Nestor Lewandowsky    Person(s) Educated Patient    Methods Explanation    Comprehension Verbalized understanding;Returned demonstration              PT Short Term Goals - 01/18/21 1334       PT SHORT TERM GOAL #1   Title Ind with initial HEP    Status Partially Met      PT SHORT TERM GOAL #2   Title Able to rise from a chair 1x without UE assist    Status Partially Met      PT SHORT  TERM GOAL #3   Title Pt to have negative Dix-Hallpike to bilat sides.    Time 4    Period Weeks    Status New               PT Long Term Goals - 01/18/21 1335       PT LONG TERM GOAL #1   Title Patient able to move her neck without difficulty and pain.    Status On-going      PT LONG TERM GOAL #2   Title Patient able to look up with full neck extension and no dizziness.    Status On-going      PT LONG TERM GOAL #3   Title Pt independent with advanced HEP    Status On-going      PT LONG TERM GOAL #4   Title Functional Gait Assessment improved to 26/30    Status On-going      PT LONG TERM GOAL #5   Title 5x sit to stand without UE use 25 sec    Status On-going                   Plan - 01/18/21 1230     Clinical Impression Statement Ms Littles presents with a new referral from Dr Tamala Julian to allow assessment of dizziness and vestibular symptoms. Pt reports that she has been having dizziness especially when she looks up and when lying down and rolling on her side.  Pt with slight nstagmus noted with head thrust test, but had significant nystagmus with L Dix-Hallpike.  Proceeded with treatment with Epley Maneuver x2 rounds.  Pt did report a relief of some of her symptoms, but the dizziness had not completely subsided. Educated pt on Nestor Lewandowsky for HEP and had pt demonstrate in clinic. Pt would benefit from addition of vestibular rehab and cannalith repositioning strategies to address her dizziness and continue to progress patients independence and safety and decrease her risk of further falls.    Personal Factors and Comorbidities Comorbidity 3+    Comorbidities anxiety, depression, dizziness    Examination-Activity Limitations Carry;Stand;Locomotion Level    Examination-Participation Restrictions Community Activity  Stability/Clinical Decision Making Evolving/Moderate complexity    Clinical Decision Making Moderate    Rehab Potential Good    PT Frequency 2x /  week    PT Duration 8 weeks    PT Treatment/Interventions ADLs/Self Care Home Management;Aquatic Therapy;Cryotherapy;Electrical Stimulation;Moist Heat;Traction;Gait Scientist, forensic;Therapeutic activities;Therapeutic exercise;Balance training;Neuromuscular re-education;Manual techniques;Patient/family education;Dry needling;Joint Manipulations;Spinal Manipulations;Canalith Repostioning;Vestibular    PT Next Visit Plan Assess progress with [facility] HEP. Progress strengthening and balance.    PT Home Exercise Plan [id number] for strengthening and Access Code: [email or web address] for vestibular    Consulted and Agree with Plan of Care Patient             [name]  Decreased range of motion, Pain, Dizziness, Decreased strength, Postural dysfunction, Impaired flexibility, Increased muscle spasms  Visit Diagnosis: Dizziness and giddiness - Plan: PT plan of care cert/re-cert  Cervicalgia - Plan: PT plan of care cert/re-cert  Muscle weakness (generalized) - Plan: PT plan of care cert/re-cert  Balance problem - Plan: PT plan of care cert/re-cert  Unsteadiness on feet - Plan: PT plan of care cert/re-cert     Problem List Patient Active Problem List   Diagnosis Date Noted   Posttraumatic headache 11/24/2020   Insomnia 06/21/2020   Major depression, single episode 06/21/2020   Migraine 06/21/2020   Posttraumatic stress disorder 06/21/2020   Anxiety 06/21/2020   Allergic rhinitis 06/21/2020   Hyperacusis of both ears 05/17/2020   Laryngopharyngeal reflux (LPR) 05/17/2020   Right rotator cuff tear 03/17/2020   Excessive physiologic tremor 02/11/2015   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

## 2021-01-18 NOTE — Patient Instructions (Addendum)
Access Code: [email or web address] URL: [email or web address] Date: 01/18/2021 Prepared by: Juel Burrow  Exercises Brandt-Daroff Vestibular Exercise - 1-2 x daily - 7 x weekly - 1 sets - 5 reps Seated Cervical Retraction - 1-2 x daily - 7 x weekly - 2 sets - 10 reps  Patient Education BPPV

## 2021-01-18 NOTE — Assessment & Plan Note (Signed)
Patient is making some progress at this point but it is going to be slow.  Has only been to physical therapy twice.  No change in management and does have improvement in range of motion already.  Given refill of Zanaflex to help her with some of the nighttime pain that she was noticing.  Follow-up with me again 6 to 8 weeks and hopefully we can release patient at that time.

## 2021-01-19 NOTE — Progress Notes (Signed)
Patient ID: Kristen [name], adult   DOB: Feb 15, 1961, 60 y.o.   MRN: [id number]  This visit occurred during the SARS-CoV-2 public health emergency.  Safety protocols were in place, including screening questions prior to the visit, additional usage of staff PPE, and extensive cleaning of exam room while observing appropriate contact time as indicated for disinfecting solutions.   HPI  Kristen [name] is a 60 y.o.-year-old adult, returning for f/u for postablative hypothyroidism, dx'ed in 03/2006 after RAI ablation for Graves ds. Last visit 10 months ago. PCP: [facility]Gyn: Dr Dellis Filbert.   Interim history: She scheduled this appointment due to swelling in her neck palpated at the last appt with Dr. Tamala Julian. In 2021 she had steroid injections in her shoulder for rotator cuff.  At last visit, she was planning to have surgery.  No steroid injections since. She has neuropathy >> leg weakness >> falls >> seeing Dr. Charlann Boxer >> in PT. Se has h/o low B vitamins >> now on replacement. She has repeated UTIs and yeast infections. She will see urologist. Dewaine Conger also has thrush and rash on abdomen, forearms and legs. Also has BPPV.  Pt is on LT4 100 mcg daily, taken: - in am (5:30 AM) - fasting - at least 30 min from b'fast - no calcium - no iron - stopped multivitamins at lunchtime - no PPIs - + Biotin-in Hair skin and nails vitamins  Reviewed her TFTs: 09/20/2020 (OB/GYN): TSH 1.39 Lab Results  Component Value Date   TSH 0.40 08/11/2020   TSH 3.17 05/09/2020   TSH 0.41 04/05/2020   TSH 2.50 11/28/2018   TSH 0.72 07/03/2018   TSH 1.74 01/10/2018   FREET4 1.02 08/11/2020   FREET4 1.26 05/09/2020   FREET4 1.62 (H) 04/05/2020   FREET4 1.08 11/28/2018   FREET4 1.06 07/03/2018   FREET4 1.36 01/10/2018  10/21/2014: TSH 8.210 09/01/2014: TSH 1.590 07/26/2014: TSH 0.440 06/28/2014: TSH 0.110 06/09/2014: TSH 0.150 06/09/2014: TSH 0.300 (0.34-4.8) 04/26/2014: TSH 1.490 12/11/2013: TSH  0.290 (0.34-4.8)  Pt denies: - feeling nodules in neck - hoarseness - choking - SOB with lying down  She continues to have: - dysphagia (this is chronic-in the past it resolved after gargling with salt water)  No FH of thyroid disease or cancer. No h/o radiation tx to head or neck other than RAI treatment for Graves' disease.  She continues to have anxiety/PTSD.  ROS: + See HPI  I reviewed pt's medications, allergies, PMH, social hx, family hx, and changes were documented in the history of present illness. Otherwise, unchanged from my initial visit note.  PMH: - h/o Graves' disease - Post ablative hypothyroidism - Anxiety /depression - Postmenopausal status   PMH: -No surgical history  Social History   Social History   Marital Status: Married    Spouse Name: N/A   Number of Children: 1   Occupational History   N/a     Smoking status: Never Smoker    Smokeless tobacco: No   Alcohol Use: No   Drug Use: No   Current Outpatient Medications  Medication Sig Dispense Refill   acyclovir (ZOVIRAX) 800 MG tablet Take 800 mg by mouth daily.     dicyclomine (BENTYL) 20 MG tablet Take 20 mg by mouth every 8 (eight) hours as needed.     fexofenadine (ALLEGRA) 180 MG tablet Allegra Allergy     fluticasone (FLONASE) 50 MCG/ACT nasal spray Place 1 spray into both nostrils daily. 16 g 5   gabapentin (NEURONTIN) 300 MG capsule  Take 300 mg by mouth 3 (three) times daily.     levothyroxine (SYNTHROID) 100 MCG tablet TAKE 1 TABLET BY MOUTH EVERY DAY 90 tablet 1   levothyroxine (SYNTHROID) 88 MCG tablet TAKE 1 TABLET BY MOUTH EVERY DAY BEFORE BREAKFAST 90 tablet 2   LORazepam (ATIVAN) 0.5 MG tablet Take 0.5 mg by mouth every 8 (eight) hours.     meloxicam (MOBIC) 7.5 MG tablet Take 1 tablet (7.5 mg total) by mouth daily. 30 tablet 0   pantoprazole (PROTONIX) 40 MG tablet Take 40 mg by mouth daily.     sertraline (ZOLOFT) 100 MG tablet Take 200 mg by mouth daily.  1   thiamine  (VITAMIN B-1) 100 MG tablet Take 100 mg by mouth daily.     tiZANidine (ZANAFLEX) 2 MG tablet TAKE 1 TABLET BY MOUTH EVERYDAY AT BEDTIME 30 tablet 0   zolpidem (AMBIEN) 10 MG tablet Take 10 mg by mouth at bedtime.     No current facility-administered medications for this visit.   Allergies  Allergen Reactions   Benadryl [Diphenhydramine Hcl] Other (See Comments)    Makes her skin crawl and she gets hyped up    Family history: - No thyroid family history  - No diabetes, hypertension, hyperlipidemia, cancer in family members   PE: BP (!) 176/102 (BP Location: Right Arm, Patient Position: Sitting, Cuff Size: Normal)   Pulse 76   Ht 5\' 3"  (1.6 m)   Wt 122 lb 3.2 oz (55.4 kg)   LMP 08/16/2010   SpO2 99%   BMI 21.65 kg/m  Body mass index is 21.65 kg/m. Wt Readings from Last 3 Encounters:  01/20/21 122 lb 3.2 oz (55.4 kg)  01/18/21 123 lb (55.8 kg)  12/02/20 124 lb 6.4 oz (56.4 kg)   Constitutional: normal weight, in NAD Eyes: PERRLA, EOMI, no exophthalmos ENT: moist mucous membranes, no thyromegaly, + mild bilateral cervical lymphadenopathy Cardiovascular: RRR, No MRG Respiratory: CTA B Gastrointestinal: abdomen soft, NT, ND, BS+ Musculoskeletal: no deformities, strength intact in all 4 Skin: moist, warm, no rashes Neurological: no tremor with outstretched hands, DTR normal in all 4  ASSESSMENT: 1.  Post ablative Hypothyroidism  2.  Neck swelling  PLAN:  1. Patient with longstanding hypothyroidism, developed after RAI ablation for Graves' disease, with uncontrolled TFTs over the years.  Her TFTs improved after switching to Synthroid d.a.w.  However, she then had to switch back to generic levothyroxine due to price. - latest thyroid labs reviewed with pt. >> normal in 09/2020 - she is currently on 100 mcg daily, apparently sent to the pharmacy approximately 10 days ago.  This is higher than the 88 mcg daily that she was on at last visit. - pt feels good on this dose.  His  blood pressure was high today on arrival, but it improved to 132/96 at the end of the visit. - we discussed about taking the thyroid hormone every day, with water, >30 minutes before breakfast, separated by >4 hours from acid reflux medications, calcium, iron, multivitamins. Pt. is taking it correctly. - will check thyroid tests in 1 month: TSH and fT4 -I advised her to stay off biotin for 1 week prior to the labs - If labs are abnormal, she will need to return for repeat TFTs in 1.5 months - OTW, RTC in 1 year  2.  Neck swelling -At today's visit, I do not feel thyromegaly on palpation or any thyroid nodules.  She does have very mild lymphadenopathy bilaterally on the  sides of her neck -She does describe mild dysphagia but this is not new.  She also complains of dry mouth, which can contribute to this -Of note, a thyroid uptake was homogeneous in the past -For now, I do not feel that we absolutely need to check imaging of her neck but I advised her to let me know if she develops increased pressure in her neck or other neck compression symptoms.  Orders Placed This Encounter  Procedures   TSH   T4, free    Philemon Kingdom, MD PhD [facility] Endocrinology

## 2021-01-20 ENCOUNTER — Other Ambulatory Visit: Payer: Self-pay

## 2021-01-20 ENCOUNTER — Encounter: Payer: Self-pay | Admitting: Internal Medicine

## 2021-01-20 ENCOUNTER — Ambulatory Visit (INDEPENDENT_AMBULATORY_CARE_PROVIDER_SITE_OTHER): Payer: Medicare Other | Admitting: Internal Medicine

## 2021-01-20 VITALS — BP 132/96 | HR 76 | Ht 63.0 in | Wt 122.2 lb

## 2021-01-20 DIAGNOSIS — E890 Postprocedural hypothyroidism: Secondary | ICD-10-CM

## 2021-01-20 DIAGNOSIS — R221 Localized swelling, mass and lump, neck: Secondary | ICD-10-CM

## 2021-01-20 NOTE — Patient Instructions (Addendum)
Please stop at the lab.  Continue Synthroid 100 mcg daily.  Take the thyroid hormone every day, with water, at least 30 minutes before breakfast, separated by at least 4 hours from: - acid reflux medications - calcium - iron - multivitamins  Please come back for labs in 1 month. Stay off Biotin for at least 1 week before labs.  Please return in 1 year.

## 2021-01-23 ENCOUNTER — Ambulatory Visit: Payer: Medicare Other | Admitting: Physical Therapy

## 2021-01-23 ENCOUNTER — Other Ambulatory Visit: Payer: Self-pay

## 2021-01-23 ENCOUNTER — Encounter: Payer: Self-pay | Admitting: Physical Therapy

## 2021-01-23 DIAGNOSIS — R2681 Unsteadiness on feet: Secondary | ICD-10-CM

## 2021-01-23 DIAGNOSIS — R42 Dizziness and giddiness: Secondary | ICD-10-CM

## 2021-01-23 DIAGNOSIS — R2689 Other abnormalities of gait and mobility: Secondary | ICD-10-CM

## 2021-01-23 DIAGNOSIS — M6281 Muscle weakness (generalized): Secondary | ICD-10-CM

## 2021-01-23 DIAGNOSIS — M542 Cervicalgia: Secondary | ICD-10-CM

## 2021-01-23 NOTE — Therapy (Signed)
[facility] @ [place], [place], [address] Phone: [phone]   Fax:  [phone]  Physical Therapy Treatment  Patient Details  Name: Kristen [name] MRN: [id number] Date of Birth: [birth year] Referring Provider (PT): Hulan Saas DO   Encounter Date: 01/23/2021   PT End of Session - 01/23/21 0939     Visit Number 5    Date for PT Re-Evaluation 03/10/21    Authorization Type MCR    Progress Note Due on Visit 10    PT Start Time 0939   pt late   PT Stop Time 1012    PT Time Calculation (min) 33 min    Activity Tolerance Patient tolerated treatment well    Behavior During Therapy [facility] for tasks assessed/performed             Past Medical History:  Diagnosis Date   Anxiety    Depression    Hair loss    Insomnia    Migraine    Post-surgical hypothyroidism    ?cancer    Past Surgical History:  Procedure Laterality Date   EYE SURGERY     SHOULDER ARTHROSCOPY WITH SUBACROMIAL DECOMPRESSION Right 04/11/2020   Procedure: SHOULDER ARTHROSCOPY WITH SUBACROMIAL DECOMPRESSION;  Surgeon: Tania Ade, MD;  Location: [facility];  Service: Orthopedics;  Laterality: Right;    There were no vitals filed for this visit.   Subjective Assessment - 01/23/21 0941     Subjective My new exercises are going well. No dizziness to report. Had a fall the other day when a young child ran into her. Hip was sore after.    Pertinent History anxiety, depression, right RCR    Currently in Pain? No/denies                               [facility] Adult PT Treatment/Exercise - 01/23/21 0001       Neuro Re-ed    Neuro Re-ed Details  green loop side stepping 2x 6 lengths Bil SBA   Red band given to add this to HEP     Neck Exercises: Machines for Strengthening   UBE (Upper Arm Bike) 3x3 L1 with concurrent discussion of status with pt.      Neck Exercises: Theraband   Horizontal  ABduction --   red band standing 15x     Knee/Hip Exercises: Aerobic   Nustep L2 x 8 min with PTA present      Knee/Hip Exercises: Machines for Strengthening   Cybex Leg Press Seat 5: Bil 50# 10x      Knee/Hip Exercises: Standing   Forward Step Up Both;1 set;10 reps;Hand Hold: 1;Step Height: 6"      Knee/Hip Exercises: Seated   Sit to Sand --   Gray chair: hodling 5# KB 5x required 1 blue mat                    PT Education - 01/23/21 1005     Education Details HEP progression: gave pt red band for home use    Person(s) Educated Patient    Methods Explanation;Demonstration;Verbal cues;Handout    Comprehension Returned demonstration;Verbalized understanding              PT Short Term Goals - 01/23/21 0945       PT SHORT TERM GOAL #1   Title Ind with initial HEP    Status Achieved  Target Date 01/25/21               PT Long Term Goals - 01/18/21 1335       PT LONG TERM GOAL #1   Title Patient able to move her neck without difficulty and pain.    Status On-going      PT LONG TERM GOAL #2   Title Patient able to look up with full neck extension and no dizziness.    Status On-going      PT LONG TERM GOAL #3   Title Pt independent with advanced HEP    Status On-going      PT LONG TERM GOAL #4   Title Functional Gait Assessment improved to 26/30    Status On-going      PT LONG TERM GOAL #5   Title 5x sit to stand without UE use 25 sec    Status On-going                   Plan - 01/23/21 0943     Clinical Impression Statement Pt arrives with no neck pain, she had a fall the other when a young child ran into her and knocked her down in her home. Pt is compliant with her new vestibular exercises and reports dizziness has abolished more and more each day since starting them. treatment focuses today on LE strength and balance. Pt could not rise from standard chair today holding 5# KB. She could by adding 1 blue pad to chair. Added  resisted side stepping with red band to HEP today.    Personal Factors and Comorbidities Comorbidity 3+    Comorbidities anxiety, depression, dizziness    Examination-Activity Limitations Carry;Stand;Locomotion Level    Examination-Participation Restrictions Community Activity    Stability/Clinical Decision Making Evolving/Moderate complexity    Rehab Potential Good    PT Frequency 2x / week    PT Duration 8 weeks    PT Treatment/Interventions ADLs/Self Care Home Management;Aquatic Therapy;Cryotherapy;Electrical Stimulation;Moist Heat;Traction;Gait Scientist, forensic;Therapeutic activities;Therapeutic exercise;Balance training;Neuromuscular re-education;Manual techniques;Patient/family education;Dry needling;Joint Manipulations;Spinal Manipulations;Canalith Repostioning;Vestibular    PT Next Visit Plan Assess progress with [facility] HEP. Progress strengthening and balance.    PT Home Exercise Plan [id number] for strengthening and Access Code: [email or web address] for vestibular    Consulted and Agree with Plan of Care Patient             [name]  Decreased range of motion, Pain, Dizziness, Decreased strength, Postural dysfunction, Impaired flexibility, Increased muscle spasms  Visit Diagnosis: Dizziness and giddiness  Cervicalgia  Muscle weakness (generalized)  Balance problem  Unsteadiness on feet     Problem List Patient Active Problem List   Diagnosis Date Noted   Posttraumatic headache 11/24/2020   Insomnia 06/21/2020   Major depression, single episode 06/21/2020   Migraine 06/21/2020   Posttraumatic stress disorder 06/21/2020   Anxiety 06/21/2020   Allergic rhinitis 06/21/2020   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

## 2021-01-25 ENCOUNTER — Ambulatory Visit: Payer: Medicare Other | Admitting: Rehabilitative and Restorative Service Providers"

## 2021-01-26 ENCOUNTER — Telehealth: Payer: Self-pay | Admitting: Rehabilitative and Restorative Service Providers"

## 2021-01-26 ENCOUNTER — Encounter: Payer: Self-pay | Admitting: Rehabilitative and Restorative Service Providers"

## 2021-01-26 ENCOUNTER — Ambulatory Visit: Payer: Medicare Other | Admitting: Rehabilitative and Restorative Service Providers"

## 2021-01-26 ENCOUNTER — Other Ambulatory Visit: Payer: Self-pay

## 2021-01-26 DIAGNOSIS — M542 Cervicalgia: Secondary | ICD-10-CM

## 2021-01-26 DIAGNOSIS — R2689 Other abnormalities of gait and mobility: Secondary | ICD-10-CM

## 2021-01-26 DIAGNOSIS — R42 Dizziness and giddiness: Secondary | ICD-10-CM

## 2021-01-26 DIAGNOSIS — R2681 Unsteadiness on feet: Secondary | ICD-10-CM

## 2021-01-26 DIAGNOSIS — M6281 Muscle weakness (generalized): Secondary | ICD-10-CM

## 2021-01-26 NOTE — Telephone Encounter (Signed)
Called pt to inquire about her 'no show' she apologized stated that she was on her way. She stated that she was in the MD and they were taking longer than expected drawing blood. Explained to pt that she was already over 30 min late for her appointment. Upon ending call, another PT offered to see patient and pt was agreeable to seeing another PT today.

## 2021-01-26 NOTE — Therapy (Signed)
[facility] @ [place], [place], [address] Phone: [phone]   Fax:  [phone]  Physical Therapy Treatment  Patient Details  Name: Kristen [name] MRN: [id number] Date of Birth: 1960-05-22 Referring Provider (PT): Hulan Saas DO   Encounter Date: 01/26/2021   PT End of Session - 01/26/21 1026     Visit Number 6    Date for PT Re-Evaluation 03/10/21    Authorization Type MCR    Progress Note Due on Visit 10    PT Start Time 1019    PT Stop Time 1059    PT Time Calculation (min) 40 min    Activity Tolerance Patient tolerated treatment well    Behavior During Therapy [facility] for tasks assessed/performed             Past Medical History:  Diagnosis Date   Anxiety    Depression    Hair loss    Insomnia    Migraine    Post-surgical hypothyroidism    ?cancer    Past Surgical History:  Procedure Laterality Date   EYE SURGERY     SHOULDER ARTHROSCOPY WITH SUBACROMIAL DECOMPRESSION Right 04/11/2020   Procedure: SHOULDER ARTHROSCOPY WITH SUBACROMIAL DECOMPRESSION;  Surgeon: Tania Ade, MD;  Location: [facility];  Service: Orthopedics;  Laterality: Right;    There were no vitals filed for this visit.   Subjective Assessment - 01/26/21 1023     Subjective Pt reports that she just returned from the MD and they are trying to determine why she keeps getting yeast infections and why she is having streaking down her arms. Pt reports that she is going to be referred to a Urologist and possibly a Special Disease Doctor.    Patient Stated Goals more movement in her neck    Currently in Pain? No/denies                               [facility] Adult PT Treatment/Exercise - 01/26/21 0001       High Level Balance   High Level Balance Activities Tandem walking   with close SBA/CGA     Neuro Re-ed    Neuro Re-ed Details  yellow/medium loop side stepping 2x 6 lengths Bil  SBA      Neck Exercises: Machines for Strengthening   UBE (Upper Arm Bike) 3x3 L2.0 with concurrent discussion of status with pt.    Cybex Row 10# 2x10    Lat Pull 25# 2x10      Neck Exercises: Standing   Wall Push Ups 20 reps      Knee/Hip Exercises: Machines for Strengthening   Cybex Leg Press Seat 5: Bil 50# 2x10      Knee/Hip Exercises: Standing   Lateral Step Up Both;1 set;10 reps;Hand Hold: 1;Step Height: 6"    Forward Step Up Both;1 set;10 reps;Hand Hold: 1;Step Height: 6"      Knee/Hip Exercises: Seated   Sit to Sand 2 sets;5 reps;without UE support   holding a 5# kettlebell                      PT Short Term Goals - 01/26/21 1113       PT SHORT TERM GOAL #1   Title Ind with initial HEP    Status Achieved      PT SHORT TERM GOAL #2   Title Able to rise from  a chair 1x without UE assist    Status Achieved      PT SHORT TERM GOAL #3   Status On-going               PT Long Term Goals - 01/18/21 1335       PT LONG TERM GOAL #1   Title Patient able to move her neck without difficulty and pain.    Status On-going      PT LONG TERM GOAL #2   Title Patient able to look up with full neck extension and no dizziness.    Status On-going      PT LONG TERM GOAL #3   Title Pt independent with advanced HEP    Status On-going      PT LONG TERM GOAL #4   Title Functional Gait Assessment improved to 26/30    Status On-going      PT LONG TERM GOAL #5   Title 5x sit to stand without UE use 25 sec    Status On-going                   Plan - 01/26/21 1110     Clinical Impression Statement Pt continues to deny any pain or dizziness. She states that during [facility], she continues to deny any symptoms of dizziness when doing at home. Pt has another MD appointment at 11:20, so she must leave by 11 today. Pt tolerated addition of weight machines well and without increased pain. Pt requires CGA with tandem gait in clinic secondary to  instability. She continues to require cuing with posture and core stability.    PT Treatment/Interventions ADLs/Self Care Home Management;Aquatic Therapy;Cryotherapy;Electrical Stimulation;Moist Heat;Traction;Gait Scientist, forensic;Therapeutic activities;Therapeutic exercise;Balance training;Neuromuscular re-education;Manual techniques;Patient/family education;Dry needling;Joint Manipulations;Spinal Manipulations;Canalith Repostioning;Vestibular    PT Next Visit Plan Assess dizziness. Progress strengthening, core stability/postural control and balance.    Consulted and Agree with Plan of Care Patient             [name]  Decreased range of motion, Pain, Dizziness, Decreased strength, Postural dysfunction, Impaired flexibility, Increased muscle spasms  Visit Diagnosis: Dizziness and giddiness  Cervicalgia  Muscle weakness (generalized)  Balance problem  Unsteadiness on feet     Problem List Patient Active Problem List   Diagnosis Date Noted   Posttraumatic headache 11/24/2020   Insomnia 06/21/2020   Major depression, single episode 06/21/2020   Migraine 06/21/2020   Posttraumatic stress disorder 06/21/2020   Anxiety 06/21/2020   Allergic rhinitis 06/21/2020   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

## 2021-01-30 ENCOUNTER — Ambulatory Visit: Payer: Medicare Other | Admitting: Physical Therapy

## 2021-01-30 ENCOUNTER — Encounter: Payer: Self-pay | Admitting: Physical Therapy

## 2021-01-30 ENCOUNTER — Other Ambulatory Visit: Payer: Self-pay

## 2021-01-30 DIAGNOSIS — R42 Dizziness and giddiness: Secondary | ICD-10-CM

## 2021-01-30 DIAGNOSIS — M6281 Muscle weakness (generalized): Secondary | ICD-10-CM

## 2021-01-30 DIAGNOSIS — M542 Cervicalgia: Secondary | ICD-10-CM

## 2021-01-30 DIAGNOSIS — R2681 Unsteadiness on feet: Secondary | ICD-10-CM

## 2021-01-30 DIAGNOSIS — R2689 Other abnormalities of gait and mobility: Secondary | ICD-10-CM

## 2021-01-30 NOTE — Therapy (Signed)
Kristen [name], [place], [address] Phone: [phone]   Fax:  [phone]  Physical Therapy Treatment  Patient Details  Name: Kristen [name] MRN: [id number] Date of Birth: [birth year] Referring Provider (PT): Hulan Saas DO   Encounter Date: 01/30/2021   PT End of Session - 01/30/21 0933     Visit Number 7    Date for PT Re-Evaluation 03/10/21    Authorization Type MCR    Progress Note Due on Visit 10    PT Start Time 0930    PT Stop Time 1008    PT Time Calculation (min) 38 min    Activity Tolerance Patient tolerated treatment well    Behavior During Therapy [facility] for tasks assessed/performed             Past Medical History:  Diagnosis Date   Anxiety    Depression    Hair loss    Insomnia    Migraine    Post-surgical hypothyroidism    ?cancer    Past Surgical History:  Procedure Laterality Date   EYE SURGERY     SHOULDER ARTHROSCOPY WITH SUBACROMIAL DECOMPRESSION Right 04/11/2020   Procedure: SHOULDER ARTHROSCOPY WITH SUBACROMIAL DECOMPRESSION;  Surgeon: Tania Ade, MD;  Location: [place];  Service: Orthopedics;  Laterality: Right;    There were no vitals filed for this visit.   Subjective Assessment - 01/30/21 0935     Subjective I actually feel pretty good this AM.    Currently in Pain? No/denies                               [facility] Adult PT Treatment/Exercise - 01/30/21 0001       Neck Exercises: Machines for Strengthening   UBE (Upper Arm Bike) 3x3 L2 with PTA present to discuss status    Cybex Row 10# 2x10    Lat Pull 25# 2x10      Neck Exercises: Standing   Wall Push Ups 20 reps      Knee/Hip Exercises: Machines for Strengthening   Cybex Leg Press Seat 5: Bil 50# 10x,      Knee/Hip Exercises: Standing   Lateral Step Up Both;1 set;15 reps;Hand Hold: 1;Step Height: 6"    Forward Step Up Both;1 set;15 reps;Hand Hold:  2;Step Height: 6"    Forward Step Up Limitations Light UE      Knee/Hip Exercises: Seated   Sit to Sand --   3x6 holding 5# KB: first & 3rd set no booster seat: Tc to tap glutes                      PT Short Term Goals - 01/26/21 1113       PT SHORT TERM GOAL #1   Title Ind with initial HEP    Status Achieved      PT SHORT TERM GOAL #2   Title Able to rise from a chair 1x without UE assist    Status Achieved      PT SHORT TERM GOAL #3   Status On-going               PT Long Term Goals - 01/18/21 1335       PT LONG TERM GOAL #1   Title Patient able to move her neck without difficulty and pain.    Status On-going  PT LONG TERM GOAL #2   Title Patient able to look up with full neck extension and no dizziness.    Status On-going      PT LONG TERM GOAL #3   Title Pt independent with advanced HEP    Status On-going      PT LONG TERM GOAL #4   Title Functional Gait Assessment improved to 26/30    Status On-going      PT LONG TERM GOAL #5   Title 5x sit to stand without UE use 25 sec    Status On-going                   Plan - 01/30/21 0935     Clinical Impression Statement Pt arrives pain free and reports 'feeling pretty good this AM." We continue to work on UE strength, endurance, and functional tasks tolerance. Current loads were challenging for pt to be to maintain proper posture and alignment. Pt quickly demonstrates LE fatigue RT > Lt.    Personal Factors and Comorbidities Comorbidity 3+    Comorbidities anxiety, depression, dizziness    Examination-Activity Limitations Carry;Stand;Locomotion Level    Examination-Participation Restrictions Community Activity    Stability/Clinical Decision Making Evolving/Moderate complexity    Rehab Potential Good    PT Frequency 2x / week    PT Duration 8 weeks    PT Treatment/Interventions ADLs/Self Care Home Management;Aquatic Therapy;Cryotherapy;Electrical Stimulation;Moist  Heat;Traction;Gait Scientist, forensic;Therapeutic activities;Therapeutic exercise;Balance training;Neuromuscular re-education;Manual techniques;Patient/family education;Dry needling;Joint Manipulations;Spinal Manipulations;Canalith Repostioning;Vestibular    PT Next Visit Plan Assess dizziness. Progress strengthening, core stability/postural control and balance.    PT Home Exercise Plan [id number] for strengthening and Access Code: [email or web address] for vestibular    Consulted and Agree with Plan of Care Patient             [name]  Decreased range of motion, Pain, Dizziness, Decreased strength, Postural dysfunction, Impaired flexibility, Increased muscle spasms  Visit Diagnosis: Dizziness and giddiness  Cervicalgia  Muscle weakness (generalized)  Balance problem  Unsteadiness on feet     Problem List Patient Active Problem List   Diagnosis Date Noted   Posttraumatic headache 11/24/2020   Insomnia 06/21/2020   Major depression, single episode 06/21/2020   Migraine 06/21/2020   Posttraumatic stress disorder 06/21/2020   Anxiety 06/21/2020   Allergic rhinitis 06/21/2020   Hyperacusis of both ears 05/17/2020   Laryngopharyngeal reflux (LPR) 05/17/2020   Right rotator cuff tear 03/17/2020   Excessive physiologic tremor 02/11/2015   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

## 2021-02-01 ENCOUNTER — Encounter: Payer: Medicare Other | Admitting: Rehabilitative and Restorative Service Providers"

## 2021-02-06 ENCOUNTER — Encounter: Payer: Self-pay | Admitting: Rehabilitative and Restorative Service Providers"

## 2021-02-06 ENCOUNTER — Ambulatory Visit: Payer: Medicare Other | Admitting: Rehabilitative and Restorative Service Providers"

## 2021-02-06 ENCOUNTER — Other Ambulatory Visit: Payer: Self-pay

## 2021-02-06 DIAGNOSIS — M542 Cervicalgia: Secondary | ICD-10-CM

## 2021-02-06 DIAGNOSIS — R2689 Other abnormalities of gait and mobility: Secondary | ICD-10-CM

## 2021-02-06 DIAGNOSIS — M6281 Muscle weakness (generalized): Secondary | ICD-10-CM

## 2021-02-06 DIAGNOSIS — R42 Dizziness and giddiness: Secondary | ICD-10-CM

## 2021-02-06 DIAGNOSIS — R2681 Unsteadiness on feet: Secondary | ICD-10-CM

## 2021-02-06 NOTE — Therapy (Signed)
[facility] @ [place], [place], [address] Phone: [phone]   Fax:  [phone]  Physical Therapy Treatment  Patient Details  Name: Kristen [name] MRN: [id number] Date of Birth: Jan 08, 1961 Referring Provider (PT): Hulan Saas DO   Encounter Date: 02/06/2021   PT End of Session - 02/06/21 1009     Visit Number 8    Date for PT Re-Evaluation 03/10/21    Authorization Type MCR    Progress Note Due on Visit 10    PT Start Time 0927    PT Stop Time 1005    PT Time Calculation (min) 38 min    Activity Tolerance Other (comment)   limited secondary to dizziness   Behavior During Therapy [facility] for tasks assessed/performed             Past Medical History:  Diagnosis Date   Anxiety    Depression    Hair loss    Insomnia    Migraine    Post-surgical hypothyroidism    ?cancer    Past Surgical History:  Procedure Laterality Date   EYE SURGERY     SHOULDER ARTHROSCOPY WITH SUBACROMIAL DECOMPRESSION Right 04/11/2020   Procedure: SHOULDER ARTHROSCOPY WITH SUBACROMIAL DECOMPRESSION;  Surgeon: Tania Ade, MD;  Location: [place];  Service: Orthopedics;  Laterality: Right;    There were no vitals filed for this visit.   Subjective Assessment - 02/06/21 0934     Subjective I have been having some dizziness.  I was hoping that it was gone.    Patient Stated Goals more movement in her neck    Currently in Pain? Yes    Pain Score 7     Pain Location Neck    Pain Orientation Lower                     Vestibular Assessment - 02/06/21 0001       Positional Testing   Dix-Hallpike Dix-Hallpike Left      Dix-Hallpike Left   Dix-Hallpike Left Symptoms Upbeat, left rotatory nystagmus                      [facility] Adult PT Treatment/Exercise - 02/06/21 0001       Neck Exercises: Machines for Strengthening   Nustep L3 x6 min.  PT present to discuss pt progress              Vestibular Treatment/Exercise - 02/06/21 0001       Vestibular Treatment/Exercise   Gaze Exercises Comment   pencil push-ups 2x10      EPLEY MANUEVER LEFT   Number of Reps  3    Overall Response  Improved Symptoms     RESPONSE DETAILS LEFT decreased nystagmus during treatment, however, pt with dizziness noted following                      PT Short Term Goals - 02/06/21 1028       PT SHORT TERM GOAL #3   Title Pt to have negative Dix-Hallpike to bilat sides.    Status On-going               PT Long Term Goals - 01/18/21 1335       PT LONG TERM GOAL #1   Title Patient able to move her neck without difficulty and pain.    Status On-going      PT LONG  TERM GOAL #2   Title Patient able to look up with full neck extension and no dizziness.    Status On-going      PT LONG TERM GOAL #3   Title Pt independent with advanced HEP    Status On-going      PT LONG TERM GOAL #4   Title Functional Gait Assessment improved to 26/30    Status On-going      PT LONG TERM GOAL #5   Title 5x sit to stand without UE use 25 sec    Status On-going                   Plan - 02/06/21 1011     Clinical Impression Statement Pt with increased dizziness noted today.  Proceeded to assess with [name] and pt with negative on R and positive on L side.  Proceeded with treatment with Epley Maneuver and pt did well initially with treatment x3 reps.  However, following, pt stated that she started feeling dizzy and did have some nystagmus noted in seated position. Pt started to feel nauseous, but with cool cloth, pt no longer experienced nausea. Pt stated that she was still dizzy, but feeling some better.    PT Treatment/Interventions ADLs/Self Care Home Management;Aquatic Therapy;Cryotherapy;Electrical Stimulation;Moist Heat;Traction;Gait Scientist, forensic;Therapeutic activities;Therapeutic exercise;Balance training;Neuromuscular re-education;Manual  techniques;Patient/family education;Dry needling;Joint Manipulations;Spinal Manipulations;Canalith Repostioning;Vestibular    PT Next Visit Plan Assess dizziness. Progress strengthening, core stability/postural control and balance.    Consulted and Agree with Plan of Care Patient             [name]  Decreased range of motion, Pain, Dizziness, Decreased strength, Postural dysfunction, Impaired flexibility, Increased muscle spasms  Visit Diagnosis: Dizziness and giddiness  Cervicalgia  Muscle weakness (generalized)  Balance problem  Unsteadiness on feet     Problem List Patient Active Problem List   Diagnosis Date Noted   Posttraumatic headache 11/24/2020   Insomnia 06/21/2020   Major depression, single episode 06/21/2020   Migraine 06/21/2020   Posttraumatic stress disorder 06/21/2020   Anxiety 06/21/2020   Allergic rhinitis 06/21/2020   Hyperacusis of both ears 05/17/2020   Laryngopharyngeal reflux (LPR) 05/17/2020   Right rotator cuff tear 03/17/2020   Excessive physiologic tremor 02/11/2015   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

## 2021-02-07 ENCOUNTER — Telehealth: Payer: Self-pay | Admitting: Family Medicine

## 2021-02-07 NOTE — Telephone Encounter (Signed)
When does she see PT again? I would tell them.  Otherwise I am out of the office starting tomorrow and would need to see another provider or would need to go to urgent care

## 2021-02-07 NOTE — Telephone Encounter (Signed)
Pt went to PT yesterday and they were performing a position of some kind to help her with her dizziness BUT it has made it worse. She has had dizziness and nausea since PT yesterday unsure what to do.

## 2021-02-07 NOTE — Telephone Encounter (Signed)
Spoke with patient. She will inform PT and follow up with [place] if needed

## 2021-02-08 ENCOUNTER — Encounter: Payer: Medicare Other | Admitting: Rehabilitative and Restorative Service Providers"

## 2021-02-10 ENCOUNTER — Ambulatory Visit: Payer: Medicare Other | Attending: Family Medicine | Admitting: Physical Therapy

## 2021-02-10 ENCOUNTER — Other Ambulatory Visit: Payer: Self-pay

## 2021-02-10 DIAGNOSIS — M542 Cervicalgia: Secondary | ICD-10-CM | POA: Insufficient documentation

## 2021-02-10 DIAGNOSIS — R2681 Unsteadiness on feet: Secondary | ICD-10-CM | POA: Diagnosis present

## 2021-02-10 DIAGNOSIS — R2689 Other abnormalities of gait and mobility: Secondary | ICD-10-CM | POA: Diagnosis present

## 2021-02-10 DIAGNOSIS — M6281 Muscle weakness (generalized): Secondary | ICD-10-CM | POA: Diagnosis present

## 2021-02-10 DIAGNOSIS — R42 Dizziness and giddiness: Secondary | ICD-10-CM | POA: Insufficient documentation

## 2021-02-10 NOTE — Therapy (Signed)
[facility] [facility] @ [place], [place], [address] Phone: [phone]   Fax:  [phone]  Physical Therapy Treatment  Patient Details  Name: Kristen [name] MRN: [id number] Date of Birth: [birth year] Referring Provider (PT): Hulan Saas DO   Encounter Date: 02/10/2021   PT End of Session - 02/10/21 1110     Visit Number 9    Date for PT Re-Evaluation 03/10/21    Authorization Type MCR    PT Start Time 1101    PT Stop Time 1139    PT Time Calculation (min) 38 min    Behavior During Therapy [facility] for tasks assessed/performed             Past Medical History:  Diagnosis Date   Anxiety    Depression    Hair loss    Insomnia    Migraine    Post-surgical hypothyroidism    ?cancer    Past Surgical History:  Procedure Laterality Date   EYE SURGERY     SHOULDER ARTHROSCOPY WITH SUBACROMIAL DECOMPRESSION Right 04/11/2020   Procedure: SHOULDER ARTHROSCOPY WITH SUBACROMIAL DECOMPRESSION;  Surgeon: Tania Ade, MD;  Location: [place];  Service: Orthopedics;  Laterality: Right;    There were no vitals filed for this visit.   Subjective Assessment - 02/10/21 1105     Subjective Pt was nauseus last time after doing the eply.  Pt is feeling weak today and brain fog but not having dizziness.  Pt went to a seminar to learn about neuropathy and they told her she had 37% neuropothy in her legs.    Patient Stated Goals more movement in her neck    Currently in Pain? No/denies                               [facility] Adult PT Treatment/Exercise - 02/10/21 0001       Neck Exercises: Machines for Strengthening   Nustep L3 x6 min.  PT present to discuss pt progress    Cybex Row 10# 2x10    Lat Pull 25# 2x10      Neck Exercises: Standing   Wall Push Ups 20 reps   add red ball     Knee/Hip Exercises: Stretches   Active Hamstring Stretch Both;60 seconds      Knee/Hip Exercises:  Machines for Strengthening   Cybex Leg Press Seat 5: Bil 60# 2x10      Knee/Hip Exercises: Standing   Hip Flexion Stengthening;Both;10 reps;Knee bent    Hip Flexion Limitations 2lb UE support    Hip Abduction Stengthening;Both;10 reps;Knee straight    Abduction Limitations 2lb UE support    Lateral Step Up Both;1 set;15 reps;Hand Hold: 1;Step Height: 6"    Forward Step Up Both;1 set;15 reps;Hand Hold: 2;Step Height: 6"    Forward Step Up Limitations Light UE      Knee/Hip Exercises: Seated   Other Seated Knee/Hip Exercises heel/toe raises on vibration plate                       PT Short Term Goals - 02/06/21 1028       PT SHORT TERM GOAL #3   Title Pt to have negative Dix-Hallpike to bilat sides.    Status On-going               PT Long Term Goals - 02/10/21 2242  PT LONG TERM GOAL #1   Title Patient able to move her neck without difficulty and pain.    Status On-going      PT LONG TERM GOAL #2   Title Patient able to look up with full neck extension and no dizziness.    Status On-going      PT LONG TERM GOAL #3   Title Pt independent with advanced HEP    Status On-going      PT LONG TERM GOAL #4   Title Functional Gait Assessment improved to 26/30    Status On-going      PT LONG TERM GOAL #5   Title 5x sit to stand without UE use 25 sec    Status On-going                   Plan - 02/10/21 2257     Clinical Impression Statement Pt is doing well with strengthening.  Pt was able to add standing exercises on foam mat.  Pt needed UE support during standing exercises. She had  good response from stretch and heel toe raises on vibration plate. No pain at the end of treatment today. Pt was not feeling dizzy today just the fatigue that she has been experiencing.  Pt was able to increase resistance and difficulty of exercises.  Continue to address functional goal per POC.    PT Treatment/Interventions ADLs/Self Care Home Management;Aquatic  Therapy;Cryotherapy;Electrical Stimulation;Moist Heat;Traction;Gait Scientist, forensic;Therapeutic activities;Therapeutic exercise;Balance training;Neuromuscular re-education;Manual techniques;Patient/family education;Dry needling;Joint Manipulations;Spinal Manipulations;Canalith Repostioning;Vestibular    PT Next Visit Plan Assess dizziness. Progress strengthening, core stability/postural control and balance.    PT Home Exercise Plan [id number] for strengthening and Access Code: [email or web address] for vestibular    Consulted and Agree with Plan of Care Patient             [name]  Decreased range of motion, Pain, Dizziness, Decreased strength, Postural dysfunction, Impaired flexibility, Increased muscle spasms  Visit Diagnosis: Dizziness and giddiness  Cervicalgia  Muscle weakness (generalized)  Balance problem  Unsteadiness on feet     Problem List Patient Active Problem List   Diagnosis Date Noted   Posttraumatic headache 11/24/2020   Insomnia 06/21/2020   Major depression, single episode 06/21/2020   Migraine 06/21/2020   Posttraumatic stress disorder 06/21/2020   Anxiety 06/21/2020   Allergic rhinitis 06/21/2020   Hyperacusis of both ears 05/17/2020   Laryngopharyngeal reflux (LPR) 05/17/2020   Right rotator cuff tear 03/17/2020   Excessive physiologic tremor 02/11/2015   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

## 2021-02-13 ENCOUNTER — Emergency Department (HOSPITAL_BASED_OUTPATIENT_CLINIC_OR_DEPARTMENT_OTHER): Payer: Medicare Other

## 2021-02-13 ENCOUNTER — Encounter (HOSPITAL_BASED_OUTPATIENT_CLINIC_OR_DEPARTMENT_OTHER): Payer: Self-pay | Admitting: *Deleted

## 2021-02-13 ENCOUNTER — Other Ambulatory Visit: Payer: Self-pay

## 2021-02-13 ENCOUNTER — Telehealth: Payer: Self-pay | Admitting: Physical Therapy

## 2021-02-13 ENCOUNTER — Encounter: Payer: Medicare Other | Admitting: Physical Therapy

## 2021-02-13 ENCOUNTER — Emergency Department (HOSPITAL_BASED_OUTPATIENT_CLINIC_OR_DEPARTMENT_OTHER)
Admission: EM | Admit: 2021-02-13 | Discharge: 2021-02-13 | Disposition: A | Discharge status: Home / Self Care | Payer: Medicare Other | Attending: Emergency Medicine | Admitting: Emergency Medicine

## 2021-02-13 DIAGNOSIS — Y9289 Other specified places as the place of occurrence of the external cause: Secondary | ICD-10-CM | POA: Insufficient documentation

## 2021-02-13 DIAGNOSIS — E890 Postprocedural hypothyroidism: Secondary | ICD-10-CM | POA: Diagnosis not present

## 2021-02-13 DIAGNOSIS — Z79899 Other long term (current) drug therapy: Secondary | ICD-10-CM | POA: Diagnosis not present

## 2021-02-13 DIAGNOSIS — W108XXA Fall (on) (from) other stairs and steps, initial encounter: Secondary | ICD-10-CM | POA: Insufficient documentation

## 2021-02-13 DIAGNOSIS — W19XXXA Unspecified fall, initial encounter: Secondary | ICD-10-CM

## 2021-02-13 DIAGNOSIS — S0990XA Unspecified injury of head, initial encounter: Secondary | ICD-10-CM | POA: Insufficient documentation

## 2021-02-13 MED ORDER — SUCRALFATE 1 G PO TABS: 1.0000 g | ORAL_TABLET | Freq: Three times a day (TID) | ORAL | 0 refills | Status: DC

## 2021-02-13 NOTE — Discharge Instructions (Addendum)
Based on the events which brought you to the ER today, it is possible that you may have a concussion. A concussion occurs when there is a blow to the head or body, with enough force to shake the brain and disrupt how the brain functions. You may experience symptoms such as headaches, sensitivity to light/noise, dizziness, cognitive slowing, difficulty concentrating / remembering, trouble sleeping and drowsiness. These symptoms may last anywhere from hours/days to potentially weeks/months. While these symptoms are very frustrating and perhaps debilitating, it is important that you remember that they will improve over time. Everyone has a different rate of recovery; it is difficult to predict when your symptoms will resolve. In order to allow for your brain to heal after the injury, we recommend that you see your primary physician or a physician knowledgeable in concussion management. We also advise you to let your body and brain rest: avoid physical activities (sports, gym, and exercise) and reduce cognitive demands (reading, texting, TV watching, computer use, video games, etc). School attendance, after-school activities and work may need to be modified to avoid increasing symptoms. We recommend against driving until until all symptoms have resolved. You should take 650mg  of Acetaminophen (Tylenol) every 4 hours as needed for pain control. Come back to the ER right away if you are having repeated episodes of vomiting, severe/worsening headache/dizziness or any other symptom that alarms you. We recommended that someone stay with you for the next 24 hours to monitor for these worrisome symptoms.

## 2021-02-13 NOTE — Telephone Encounter (Signed)
PTA called pt for being late for her appt today. Pt reports she had a fall over the weekend and was instructed to get a CT scan today.  Myrene Galas, PTA @TODAY @ 9:46 AM

## 2021-02-13 NOTE — ED Triage Notes (Addendum)
Pt fell early Sat morning hitting head on wall and front of heater as she fell down steps, been having headaches, feels like brain is foggy. Feels as if she can not stay awake Pt with multiple complaints, numb faces 6-7 mths, neuropathy, sores on body, B12 def and D def, stomach aches has been treated for these symptoms.

## 2021-02-13 NOTE — ED Provider Notes (Signed)
[facility] EMERGENCY DEPT Provider Note   CSN: [id number] Arrival date & time: 02/13/21  1962     History Chief Complaint  Patient presents with   Fall   Headache    Kristen [name] is a 60 y.o. adult.  This is a 60 y.o. female with significant medical history as below, including anxiety, depression, migraines who presents to the ED with complaint of fall, headache.  Patient reports 4 days ago she was attempting to ambulate down steps into her garage she lost her footing and fell backward onto the ground, hit her head against the wall and then onto a heater on the ground.  No LOC, no thinners.  Since this event she has been having intermittent headaches, fatigue, increased sleepiness.  No nausea or vomiting, no numbness or tingling, no vision or hearing changes.  Not been taking medication for the symptoms.  She has been tolerant oral intake without difficulty.  No change in bowel or bladder function.  Patient has been ambulatory since fall.  No bowel or bladder incontinence, no tongue injury, no seizure activity reported.    The history is provided by the patient. No language interpreter was used.  Fall This is a new problem. The current episode started more than 2 days ago. Associated symptoms include headaches. Pertinent negatives include no chest pain and no shortness of breath.  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     Family History  Problem Relation Age of Onset   Hypertension Father    Heart disease Father    Healthy Mother     Social History   Tobacco Use   Smoking status: Never   Smokeless tobacco: Never  Vaping Use   Vaping Use: Never used  Substance Use Topics   Alcohol use: Not Currently    Comment: rarely   Drug use: No    Home Medications Prior to Admission medications   Medication Sig Start Date End Date Taking? Authorizing Provider  sucralfate (CARAFATE) 1 g tablet Take 1 tablet (1 g total) by mouth 4 (four) times daily -  with meals and at bedtime for 7 days. 02/13/21 02/20/21 Yes Jeanell Sparrow, DO  acyclovir (ZOVIRAX) 800 MG tablet Take 800 mg by mouth daily. 10/08/20   [provider]  ALPRAZolam Duanne Moron) 0.5 MG tablet Take 0.5 mg by mouth. Up to 3x day  [provider]  dicyclomine (BENTYL) 20 MG tablet Take 20 mg by mouth every 8 (eight) hours as needed. 11/16/20   [provider]  fexofenadine (ALLEGRA) 180 MG tablet [facility]    [provider]  fluticasone (FLONASE) 50 MCG/ACT nasal spray Place 1 spray into both nostrils daily. 12/02/20   Maryjane Hurter, MD  levothyroxine (SYNTHROID) 100 MCG tablet TAKE 1 TABLET BY MOUTH EVERY DAY 01/09/21   Philemon Kingdom, MD  metoprolol tartrate (LOPRESSOR) 25 MG tablet Take 25 mg by mouth once. One time daily    [provider]  nitrofurantoin  (MACRODANTIN) 25 MG capsule Take by mouth 1 day or 1 dose. Unknown strength    [provider]  pantoprazole (PROTONIX) 40 MG tablet Take 40 mg by mouth daily.    [provider]  sertraline (ZOLOFT) 100 MG tablet Take 200 mg by mouth daily. 11/29/14   [provider]  thiamine (VITAMIN B-1) 100 MG tablet Take 100 mg by mouth daily.    [provider]  tiZANidine (ZANAFLEX) 2 MG tablet TAKE 1 TABLET BY MOUTH EVERYDAY AT BEDTIME 05/31/20   Lyndal Pulley, DO  topiramate (TOPAMAX) 100 MG tablet Take by mouth 1 day or 1 dose. Patient reports unknown strength    [provider]  zolpidem (AMBIEN) 10 MG tablet Take 10 mg by mouth at bedtime. 10/28/20   [provider]    Allergies    Benadryl [diphenhydramine hcl]  Review of Systems   Review of Systems  Constitutional:  Positive for fatigue. Negative for activity change and fever.  HENT:  Negative for facial swelling and trouble swallowing.   Eyes:  Negative for discharge and redness.  Respiratory:  Negative for cough and shortness of breath.   Cardiovascular:  Negative for chest pain and palpitations.  Gastrointestinal:  Negative for blood in stool and nausea.  Genitourinary:  Negative for dysuria and flank pain.  Musculoskeletal:  Negative for back pain and gait problem.  Skin:  Negative for pallor and rash.  Neurological:  Positive for headaches. Negative for syncope.   Physical Exam Updated Vital Signs BP (!) 151/102   Pulse 67   Temp 98.8 F (37.1 C) (Oral)   Resp 18   Ht 5\' 3"  (1.6 m)   Wt 53.5 kg   LMP 08/16/2010   SpO2 100%   BMI 20.90 kg/m   Physical Exam Vitals and nursing note reviewed.  Constitutional:      General: She is not in acute distress.    Appearance: Normal appearance.  HENT:     Head: Normocephalic and atraumatic.     Right Ear: External ear normal.     Left Ear: External ear normal.     Nose: Nose normal.     Mouth/Throat:     Mouth: Mucous  membranes are moist.  Eyes:     General: No scleral icterus.       Right eye: No discharge.        Left eye: No discharge.  Cardiovascular:     Rate and Rhythm: Normal rate and regular rhythm.     Pulses: Normal pulses.     Heart sounds: Normal heart sounds.  Pulmonary:     Effort: Pulmonary effort is normal. No respiratory distress.     Breath sounds: Normal breath sounds.  Abdominal:     General: Abdomen is flat.     Tenderness: There is no abdominal tenderness.  Musculoskeletal:  General: Normal range of motion.     Cervical back: Normal range of motion.     Right lower leg: No edema.     Left lower leg: No edema.  Skin:    General: Skin is warm and dry.     Capillary Refill: Capillary refill takes less than 2 seconds.  Neurological:     Mental Status: She is alert and oriented to person, place, and time.     GCS: GCS eye subscore is 4. GCS verbal subscore is 5. GCS motor subscore is 6.     Cranial Nerves: Cranial nerves 2-12 are intact. No dysarthria or facial asymmetry.     Sensory: Sensation is intact.     Motor: Motor function is intact. No tremor.     Coordination: Coordination is intact. Finger-Nose-Finger Test normal.     Gait: Gait is intact.  Psychiatric:        Mood and Affect: Mood normal.        Behavior: Behavior normal.    ED Results / Procedures / Treatments   Labs (all labs ordered are listed, but only abnormal results are displayed) Labs Reviewed - No data to display  EKG None  Radiology CT Head Wo Contrast  Result Date: 02/13/2021 CLINICAL DATA:  Moderate head trauma EXAM: CT HEAD WITHOUT CONTRAST TECHNIQUE: Contiguous axial images were obtained from the base of the skull through the vertex without intravenous contrast. COMPARISON:  None. FINDINGS: Brain: No acute intracranial hemorrhage. No focal mass lesion. No CT evidence of acute infarction. No midline shift or mass effect. No hydrocephalus. Basilar cisterns are patent. Vascular: No  hyperdense vessel or unexpected calcification. Skull: Normal. Negative for fracture or focal lesion. Sinuses/Orbits: Paranasal sinuses and mastoid air cells are clear. Orbits are clear. Other: None. IMPRESSION: No intracranial trauma.  No acute intracranial findings. Electronically Signed   By: Suzy Bouchard M.D.   On: 02/13/2021 11:02    Procedures Procedures   Medications Ordered in ED Medications - No data to display  ED Course  I have reviewed the triage vital signs and the nursing notes.  Pertinent labs & imaging results that were available during my care of the patient were reviewed by me and considered in my medical decision making (see chart for details).    MDM Rules/Calculators/A&P                           CC: Fall, headache  This patient complains of above; this involves an extensive number of treatment options and is a complaint that carries with it a high risk of complications and morbidity. Vital signs were reviewed. Serious etiologies considered.  Record review:  Previous records obtained and reviewed   Additional history obtained from spouse   Work up as above, notable for: Imaging results that were available during my care of the patient were reviewed by me and considered in my medical decision making.   I ordered imaging studies which included CTH and I independently visualized and interpreted imaging which showed no acute process  Patient does have some chronic abdominal discomfort for which she sees gastroenterology.  Abdomen soft, nontender, nonperitoneal.  She is tolerant oral intake no difficulty.  Requesting prescription for Carafate which has been ordered.  Patient presents with low mechanism head trauma. On initial evaluation patient appears in no acute distress, afebrile with normal vital signs. Patient has completely intact neurovascular exam, and pain improved in ED.   and found  to be without acute pathology.    Discussed possible etiology of  concussion, signs and symptoms, and discharge instructions. Detailed discussions were had with the patient regarding current findings, and need for close f/u with PCP or on call doctor. The patient has been instructed to return immediately if the symptoms worsen in any way for re-evaluation. Patient verbalized understanding and is in agreement with current care plan. All questions answered prior to discharge     This chart was dictated using voice recognition software.  Despite best efforts to proofread,  errors can occur which can change the documentation meaning.  Final Clinical Impression(s) / ED Diagnoses Final diagnoses:  Closed head injury, initial encounter  Fall, initial encounter    Rx / DC Orders ED Discharge Orders          Ordered    sucralfate (CARAFATE) 1 g tablet  3 times daily with meals & bedtime        02/13/21 1611             Jeanell Sparrow, DO 02/14/21 0019

## 2021-02-14 ENCOUNTER — Encounter: Payer: Self-pay | Admitting: Family Medicine

## 2021-02-15 ENCOUNTER — Encounter: Payer: Medicare Other | Admitting: Rehabilitative and Restorative Service Providers"

## 2021-02-20 ENCOUNTER — Ambulatory Visit: Payer: Medicare Other | Admitting: Physical Therapy

## 2021-02-20 ENCOUNTER — Encounter: Payer: Self-pay | Admitting: Physical Therapy

## 2021-02-20 ENCOUNTER — Other Ambulatory Visit: Payer: Self-pay

## 2021-02-20 DIAGNOSIS — R42 Dizziness and giddiness: Secondary | ICD-10-CM

## 2021-02-20 DIAGNOSIS — R2681 Unsteadiness on feet: Secondary | ICD-10-CM

## 2021-02-20 DIAGNOSIS — R2689 Other abnormalities of gait and mobility: Secondary | ICD-10-CM

## 2021-02-20 DIAGNOSIS — M6281 Muscle weakness (generalized): Secondary | ICD-10-CM

## 2021-02-20 DIAGNOSIS — M542 Cervicalgia: Secondary | ICD-10-CM

## 2021-02-20 NOTE — Therapy (Addendum)
[facility] @ [address] [place], [place], [address] Phone: [phone]   Fax:  [phone]  Physical Therapy Treatment  Patient Details  Name: Kristen [name] MRN: [id number] Date of Birth: 09/07/1960 Referring Provider (PT): Hulan Saas DO   Encounter Date: 02/20/2021  Progress Note Reporting Period 01/11/2021 to 02/20/2021  See note below for Objective Data and Assessment of Progress/Goals.       PT End of Session - 02/20/21 0952     Visit Number 10    Date for PT Re-Evaluation 03/10/21    Authorization Type MCR    Progress Note Due on Visit 20    PT Start Time 0950   pt late 20 min   PT Stop Time 1014    PT Time Calculation (min) 24 min    Activity Tolerance Patient tolerated treatment well    Behavior During Therapy WFL for tasks assessed/performed             Past Medical History:  Diagnosis Date   Anxiety    Depression    Hair loss    Insomnia    Migraine    Post-surgical hypothyroidism    ?cancer    Past Surgical History:  Procedure Laterality Date   EYE SURGERY     SHOULDER ARTHROSCOPY WITH SUBACROMIAL DECOMPRESSION Right 04/11/2020   Procedure: SHOULDER ARTHROSCOPY WITH SUBACROMIAL DECOMPRESSION;  Surgeon: Tania Ade, MD;  Location: [facility];  Service: Orthopedics;  Laterality: Right;    There were no vitals filed for this visit.   Subjective Assessment - 02/20/21 0954     Subjective I apologize for being late. I had a fall and hit my head last week, I am better, brain fog cleared. Pt reports a polyp or 'something" was removed from her gut during a colonoscopy and she feels "so much better, no longer pain in her gut." She walked 2x this weeend.    Pertinent History anxiety, depression, right RCR    Diagnostic tests xrays, CT, MRI's negative per pt    Currently in Pain? No/denies                [facility] PT Assessment - 02/20/21 0001       Assessment    Medical Diagnosis neck pain and dizziness    Referring Provider (PT) Hulan Saas DO      AROM   Cervical Flexion full    Cervical - Right Side Bend 24    Cervical - Left Side Bend 29    Cervical - Right Rotation 55    Cervical - Left Rotation 60      Strength   Right Hip Extension 5/5    Right Knee Extension 5/5    Left Knee Flexion 4-/5    Left Knee Extension 5/5      Standardized Balance Assessment   Five times sit to stand comments  12 sec off higher mat table      Berg Balance Test   Sit to Stand Able to stand without using hands and stabilize independently    Standing Unsupported Able to stand safely 2 minutes    Sitting with Back Unsupported but Feet Supported on Floor or Stool Able to sit safely and securely 2 minutes    Stand to Sit Sits safely with minimal use of hands    Transfers Able to transfer safely, minor use of hands    Standing Unsupported with Eyes Closed Able to stand 10 seconds safely  Standing Unsupported with Feet Together Able to place feet together independently and stand 1 minute safely    From Standing, Reach Forward with Outstretched Arm Can reach confidently >25 cm (10")    From Standing Position, Pick up Object from Floor Able to pick up shoe safely and easily    From Standing Position, Turn to Look Behind Over each Shoulder Looks behind from both sides and weight shifts well    Turn 360 Degrees Able to turn 360 degrees safely in 4 seconds or less    Standing Unsupported, Alternately Place Feet on Step/Stool Able to stand independently and safely and complete 8 steps in 20 seconds    Standing Unsupported, One Foot in Front Able to place foot tandem independently and hold 30 seconds    Standing on One Leg Able to lift leg independently and hold > 10 seconds    Total Score 56      Functional Gait  Assessment   Gait Level Surface Walks 20 ft in less than 5.5 sec, no assistive devices, good speed, no evidence for imbalance, normal gait pattern,  deviates no more than 6 in outside of the 12 in walkway width.    Change in Gait Speed Able to smoothly change walking speed without loss of balance or gait deviation. Deviate no more than 6 in outside of the 12 in walkway width.    Gait with Horizontal Head Turns Performs head turns smoothly with no change in gait. Deviates no more than 6 in outside 12 in walkway width    Gait with Vertical Head Turns Performs head turns with no change in gait. Deviates no more than 6 in outside 12 in walkway width.    Gait and Pivot Turn Pivot turns safely within 3 sec and stops quickly with no loss of balance.    Step Over Obstacle Is able to step over one shoe box (4.5 in total height) without changing gait speed. No evidence of imbalance.    Gait with Narrow Base of Support Ambulates 7-9 steps.    Gait with Eyes Closed Walks 20 ft, uses assistive device, slower speed, mild gait deviations, deviates 6-10 in outside 12 in walkway width. Ambulates 20 ft in less than 9 sec but greater than 7 sec.    Ambulating Backwards Walks 20 ft, uses assistive device, slower speed, mild gait deviations, deviates 6-10 in outside 12 in walkway width.    Steps Alternating feet, must use rail.    Total Score 25                                      PT Short Term Goals - 02/20/21 0956       PT SHORT TERM GOAL #1   Title Ind with initial HEP    Status Achieved    Target Date 01/25/21      PT SHORT TERM GOAL #2   Title Able to rise from a chair 1x without UE assist    Status Achieved    Target Date 01/25/21      PT SHORT TERM GOAL #3   Title Pt to have negative Dix-Hallpike to bilat sides.    Time 4    Period Weeks    Status On-going               PT Long Term Goals - 02/20/21 0957       PT LONG  TERM GOAL #1   Title Patient able to move her neck without difficulty and pain.    Time 8    Period Weeks    Status Achieved      PT LONG TERM GOAL #2   Title Patient able to look up  with full neck extension and no dizziness.    Time 8    Period Weeks    Status Achieved      PT LONG TERM GOAL #3   Title Pt independent with advanced HEP    Time 8    Period Weeks    Status On-going      PT LONG TERM GOAL #4   Title Functional Gait Assessment improved to 26/30    Period Weeks    Status On-going   25     PT LONG TERM GOAL #5   Title 5x sit to stand without UE use 25 sec    Time 8    Period Weeks    Status Achieved   12 sec                  Plan - 02/20/21 1101     Clinical Impression Statement Pt 20 min late for PT session today. Pt reports having a fall last week and hit her head. MD was consulted and as of today head fog has cleared. pt reports feeling very good today, no dizziness and reports her stomach pain has been resolved after having a minor procedure. See long term goals in chart for review and other tests.    Personal Factors and Comorbidities Comorbidity 3+    Comorbidities anxiety, depression, dizziness    Examination-Activity Limitations Carry;Stand;Locomotion Level    Examination-Participation Restrictions Community Activity    Rehab Potential Good    PT Frequency 2x / week    PT Duration 8 weeks    PT Treatment/Interventions ADLs/Self Care Home Management;Aquatic Therapy;Cryotherapy;Electrical Stimulation;Moist Heat;Traction;Gait Scientist, forensic;Therapeutic activities;Therapeutic exercise;Balance training;Neuromuscular re-education;Manual techniques;Patient/family education;Dry needling;Joint Manipulations;Spinal Manipulations;Canalith Repostioning;Vestibular    PT Next Visit Plan Assess dizziness. Progress strengthening, core stability/postural control and balance.    PT Home Exercise Plan [email or web address] for strengthening and Access Code: [email or web address] for vestibular    Consulted and Agree with Plan of Care Patient             Patient will benefit from skilled therapeutic intervention in order to improve the following deficits and  impairments:  Decreased range of motion, Pain, Dizziness, Decreased strength, Postural dysfunction, Impaired flexibility, Increased muscle spasms  Visit Diagnosis: Dizziness and giddiness  Cervicalgia  Muscle weakness (generalized)  Balance problem  Unsteadiness on feet     Problem List Patient Active Problem List   Diagnosis Date Noted   Posttraumatic headache 11/24/2020   Insomnia 06/21/2020   Major depression, single episode 06/21/2020   Migraine 06/21/2020   Posttraumatic stress disorder 06/21/2020   Anxiety 06/21/2020   Allergic rhinitis 06/21/2020   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

## 2021-02-21 ENCOUNTER — Other Ambulatory Visit: Payer: Medicare Other

## 2021-02-22 ENCOUNTER — Encounter: Payer: Self-pay | Admitting: Rehabilitative and Restorative Service Providers"

## 2021-02-22 ENCOUNTER — Other Ambulatory Visit: Payer: Self-pay

## 2021-02-22 ENCOUNTER — Ambulatory Visit: Payer: Medicare Other | Admitting: Rehabilitative and Restorative Service Providers"

## 2021-02-22 DIAGNOSIS — M6281 Muscle weakness (generalized): Secondary | ICD-10-CM

## 2021-02-22 DIAGNOSIS — M542 Cervicalgia: Secondary | ICD-10-CM

## 2021-02-22 DIAGNOSIS — R2681 Unsteadiness on feet: Secondary | ICD-10-CM

## 2021-02-22 DIAGNOSIS — R42 Dizziness and giddiness: Secondary | ICD-10-CM | POA: Diagnosis not present

## 2021-02-22 DIAGNOSIS — R2689 Other abnormalities of gait and mobility: Secondary | ICD-10-CM

## 2021-02-22 NOTE — Patient Instructions (Signed)

## 2021-02-22 NOTE — Therapy (Signed)
[facility] @ [place], [place], [address] Phone: [phone]   Fax:  [phone]  Physical Therapy Treatment  Patient Details  Name: Kristen [name] MRN: [id number] Date of Birth: [birth year] Referring Provider (PT): Hulan Saas DO   Encounter Date: 02/22/2021   PT End of Session - 02/22/21 1103     Visit Number 11    Date for PT Re-Evaluation 03/10/21    Authorization Type MCR    Progress Note Due on Visit 20    PT Start Time 1100    PT Stop Time 1140    PT Time Calculation (min) 40 min    Activity Tolerance Patient tolerated treatment well    Behavior During Therapy [facility] for tasks assessed/performed             Past Medical History:  Diagnosis Date   Anxiety    Depression    Hair loss    Insomnia    Migraine    Post-surgical hypothyroidism    ?cancer    Past Surgical History:  Procedure Laterality Date   EYE SURGERY     SHOULDER ARTHROSCOPY WITH SUBACROMIAL DECOMPRESSION Right 04/11/2020   Procedure: SHOULDER ARTHROSCOPY WITH SUBACROMIAL DECOMPRESSION;  Surgeon: Tania Ade, MD;  Location: [facility];  Service: Orthopedics;  Laterality: Right;    There were no vitals filed for this visit.   Subjective Assessment - 02/22/21 1102     Subjective I still have a little brain fog, but I am feeling much better.    Patient Stated Goals more movement in her neck    Currently in Pain? No/denies                               [facility] Adult PT Treatment/Exercise - 02/22/21 0001       Neck Exercises: Machines for Strengthening   Nustep L5 x6 min.  PT present to discuss pt progress   442 steps   Cybex Row 15# 2x10    Lat Pull 25# 2x10    Other Machines for Strengthening Hip Matrix 25#: hip abduction, hip flexion, hip extension x10B each      Neck Exercises: Standing   Wall Push Ups 20 reps    Wall Push Ups Limitations with red pball 2x10      Knee/Hip  Exercises: Stretches   Active Hamstring Stretch Both;1 rep;20 seconds    Active Hamstring Stretch Limitations standing at step      Knee/Hip Exercises: Machines for Strengthening   Cybex Leg Press Seat 5: Bil 60# 2x10      Manual Therapy   Manual Therapy Soft tissue mobilization;Myofascial release    Manual therapy comments in prone    Soft tissue mobilization To B upper trap, B rhomboids, L infraspinatus    Myofascial Release trigger point therapy to B upper trap, rhomboids, L infraspinatus              Trigger Point Dry Needling - 02/22/21 0001     Consent Given? Yes    Education Handout Provided Yes    Muscles Treated Head and Neck Upper trapezius    Muscles Treated Upper Quadrant Infraspinatus;Rhomboids    Upper Trapezius Response Twitch reponse elicited;Palpable increased muscle length    Rhomboids Response Twitch response elicited;Palpable increased muscle length    Infraspinatus Response Twitch response elicited;Palpable increased muscle length  PT Education - 02/22/21 1150     Education Details Pt educated about dry needling and agreeable to treatment.    Person(s) Educated Patient    Methods Explanation    Comprehension Verbalized understanding              PT Short Term Goals - 02/20/21 0956       PT SHORT TERM GOAL #1   Title Ind with initial HEP    Status Achieved    Target Date 01/25/21      PT SHORT TERM GOAL #2   Title Able to rise from a chair 1x without UE assist    Status Achieved    Target Date 01/25/21      PT SHORT TERM GOAL #3   Title Pt to have negative Dix-Hallpike to bilat sides.    Time 4    Period Weeks    Status On-going               PT Long Term Goals - 02/20/21 0957       PT LONG TERM GOAL #1   Title Patient able to move her neck without difficulty and pain.    Time 8    Period Weeks    Status Achieved      PT LONG TERM GOAL #2   Title Patient able to look up with full neck  extension and no dizziness.    Time 8    Period Weeks    Status Achieved      PT LONG TERM GOAL #3   Title Pt independent with advanced HEP    Time 8    Period Weeks    Status On-going      PT LONG TERM GOAL #4   Title Functional Gait Assessment improved to 26/30    Period Weeks    Status On-going   25     PT LONG TERM GOAL #5   Title 5x sit to stand without UE use 25 sec    Time 8    Period Weeks    Status Achieved   12 sec                  Plan - 02/22/21 1145     Clinical Impression Statement Pt states that she is feeling better and has not had any dizziness. Pt reports that she has noticed some tightness and knots in her neck and requested dry needling, as she had a friend have success.  Proceeded with treatment with dry needling, utilizing skilled palpation to locate trigger points in upper trap and upper back.  Pt reported feeling better and having improved cervical mobility following dry needling. Pt continues to require skilled PT to progress towards goal related activities and progress strengthening to decrease risk of falling again.    PT Treatment/Interventions ADLs/Self Care Home Management;Aquatic Therapy;Cryotherapy;Electrical Stimulation;Moist Heat;Traction;Gait Scientist, forensic;Therapeutic activities;Therapeutic exercise;Balance training;Neuromuscular re-education;Manual techniques;Patient/family education;Dry needling;Joint Manipulations;Spinal Manipulations;Canalith Repostioning;Vestibular    PT Next Visit Plan Assess dizziness. Progress strengthening, core stability/postural control and balance.    Consulted and Agree with Plan of Care Patient             [name]  Decreased range of motion, Pain, Dizziness, Decreased strength, Postural dysfunction, Impaired flexibility, Increased muscle spasms  Visit Diagnosis: Dizziness and  giddiness  Cervicalgia  Muscle weakness (generalized)  Balance problem  Unsteadiness on feet  Problem List Patient Active Problem List   Diagnosis Date Noted   Posttraumatic headache 11/24/2020   Insomnia 06/21/2020   Major depression, single episode 06/21/2020   Migraine 06/21/2020   Posttraumatic stress disorder 06/21/2020   Anxiety 06/21/2020   Allergic rhinitis 06/21/2020   Hyperacusis of both ears 05/17/2020   Laryngopharyngeal reflux (LPR) 05/17/2020   Right rotator cuff tear 03/17/2020   Excessive physiologic tremor 02/11/2015   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

## 2021-02-23 ENCOUNTER — Other Ambulatory Visit (INDEPENDENT_AMBULATORY_CARE_PROVIDER_SITE_OTHER): Payer: Medicare Other

## 2021-02-23 DIAGNOSIS — E890 Postprocedural hypothyroidism: Secondary | ICD-10-CM

## 2021-02-23 LAB — T4, FREE: Free T4: 2.56 ng/dL — ABNORMAL HIGH (ref 0.60–1.60)

## 2021-02-23 LAB — TSH: TSH: 3.52 u[IU]/mL (ref 0.35–5.50)

## 2021-02-24 ENCOUNTER — Encounter: Payer: Self-pay | Admitting: Internal Medicine

## 2021-02-24 NOTE — Progress Notes (Signed)
[place] Phone: [phone] Subjective:   Kristen [name], am serving as a scribe for Kristen [name].  This visit occurred during the SARS-CoV-2 public health emergency.  Safety protocols were in place, including screening questions prior to the visit, additional usage of staff PPE, and extensive cleaning of exam room while observing appropriate contact time as indicated for disinfecting solutions.   I'm seeing this patient by the request  of:  [name], Kristen P, DO  CC: Low back pain  OMV:[id number]  01/18/2021 Patient is making some progress at this point but it is going to be slow.  Has only been to physical therapy twice.  [name] change in management and does have improvement in range of motion already.  Given refill of Zanaflex to help her with some of the nighttime pain that she was noticing.  Follow-up with me again 6 to 8 weeks and hopefully we can release patient at that time  Update 02/27/2021 Kristen [name] is a 60 y.o. adult coming in with complaint of neck pain and headaches. Patient states that her pain is overall better.   States that R glute is still bothersome with getting up stairs. Feels like leg is weak. Is in PT and patient said that therapist is getting stronger. Has had a couple of falls recently. Did have to go to ED on 02/13/2021 for fall/head injury.  Patient states that physical therapy has helped overall but does not feel like she is getting stronger and once again had a recent fall again.  Feels it is secondary to the right lower extremity.  Patient notes that she was diagnosed with neuropathy and thus patient has injections in neck and back from neurology. Taking B12 and Vit D.  States recently may be feeling a little better with the neuropathy.      Past Medical History:  Diagnosis Date   Anxiety    Depression    Hair loss    Insomnia    Migraine    Post-surgical hypothyroidism     ?cancer   Past Surgical History:  Procedure Laterality Date   EYE SURGERY     SHOULDER ARTHROSCOPY WITH SUBACROMIAL DECOMPRESSION Right 04/11/2020   Procedure: SHOULDER ARTHROSCOPY WITH SUBACROMIAL DECOMPRESSION;  Surgeon: Kristen Ade, MD;  Location: [facility];  Service: Orthopedics;  Laterality: Right;   Social History   Socioeconomic History   Marital status: Married    Spouse name: Not on file   Number of children: 1   Years of education: Not on file   Highest education level: Not on file  Occupational History    Comment: unemployed  Tobacco Use   Smoking status: Never   Smokeless tobacco: Never  Vaping Use   Vaping Use: Never used  Substance and Sexual Activity   Alcohol use: Not Currently    Comment: rarely   Drug use: [name]   Sexual activity: Not on file  Other Topics Concern   Not on file  Social History Narrative   Lives with husband.   Drinks only water. Denies caffeine use.   Social Determinants of Health   Financial Resource Strain: Not on file  Food Insecurity: Not on file  Transportation Needs: Not on file  Physical Activity: Not on file  Stress: Not on file  Social Connections: Not on file   Allergies  Allergen Reactions   Benadryl [Diphenhydramine Hcl] Other (See Comments)    Makes her skin  crawl and she gets hyped up    Family History  Problem Relation Age of Onset   Hypertension Father    Heart disease Father    Healthy Mother     Current Outpatient Medications (Endocrine & Metabolic):    levothyroxine (SYNTHROID) 100 MCG tablet, TAKE 1 TABLET BY MOUTH EVERY DAY  Current Outpatient Medications (Cardiovascular):    metoprolol tartrate (LOPRESSOR) 25 MG tablet, Take 25 mg by mouth once. One time daily  Current Outpatient Medications (Respiratory):    fexofenadine (ALLEGRA) 180 MG tablet, [facility]   fluticasone (FLONASE) 50 MCG/ACT nasal spray, Place 1 spray into both nostrils daily.    Current Outpatient  Medications (Other):    acyclovir (ZOVIRAX) 800 MG tablet, Take 800 mg by mouth daily.   ALPRAZolam (XANAX) 0.5 MG tablet, Take 0.5 mg by mouth. Up to 3x day   dicyclomine (BENTYL) 20 MG tablet, Take 20 mg by mouth every 8 (eight) hours as needed.   nitrofurantoin (MACRODANTIN) 25 MG capsule, Take by mouth 1 day or 1 dose. Unknown strength   pantoprazole (PROTONIX) 40 MG tablet, Take 40 mg by mouth daily.   sertraline (ZOLOFT) 100 MG tablet, Take 200 mg by mouth daily.   thiamine (VITAMIN B-1) 100 MG tablet, Take 100 mg by mouth daily.   tiZANidine (ZANAFLEX) 2 MG tablet, TAKE 1 TABLET BY MOUTH EVERYDAY AT BEDTIME   topiramate (TOPAMAX) 100 MG tablet, Take by mouth 1 day or 1 dose. Patient reports unknown strength   zolpidem (AMBIEN) 10 MG tablet, Take 10 mg by mouth at bedtime.   sucralfate (CARAFATE) 1 g tablet, Take 1 tablet (1 g total) by mouth 4 (four) times daily -  with meals and at bedtime for 7 days.   Reviewed prior external information including notes and imaging from  primary care provider As well as notes that were available from care everywhere and other healthcare systems.  Did review patient's outside records and x-rays of the lumbar spine.  Also recently did review patient's most recent emergency room visit where she did have a fall in the CT of the head that was unremarkable also reviewed patient's MRI of the brain previously that did show some scattered white matter changes.  Past medical history, social, surgical and family history all reviewed in electronic medical record.  [name] pertanent information unless stated regarding to the chief complaint.   Review of Systems:  [name] headache, visual changes, nausea, vomiting, diarrhea, constipation,  abdominal pain, skin rash, fevers, chills, night sweats, weight loss, swollen lymph nodes, body aches, joint swelling, chest pain, shortness of breath, mood changes. POSITIVE muscle aches, body aches, dizziness  Objective  Blood pressure  132/90, pulse 67, height 5\' 3"  (1.6 m), weight 124 lb (56.2 kg), last menstrual period 08/16/2010, SpO2 97 %.   General: [name] apparent distress alert and oriented x3 mood and affect normal, dressed appropriately.  HEENT: Pupils equal, extraocular movements intact  Respiratory: Patient's speak in full sentences and does not appear short of breath  Cardiovascular: [name] lower extremity edema, non tender, [name] erythema  Gait mild antalgic Weakness noted of the right lower extremity especially with dorsi flexion with 3 out of 5 strength.  Patient does have mild weakness of 4-5 strength of the hip flexor as well.  Positive straight leg test on the right side with radicular symptoms in the L4 and L5 vicinity it appears.  This was at 20 degrees.  Does have some peripheral neuropathy bilaterally.    Impression  and Recommendations:     The above documentation has been reviewed and is accurate and complete Lyndal Pulley, DO

## 2021-02-27 ENCOUNTER — Other Ambulatory Visit: Payer: Self-pay

## 2021-02-27 ENCOUNTER — Ambulatory Visit: Payer: Medicare Other | Admitting: Physical Therapy

## 2021-02-27 ENCOUNTER — Encounter: Payer: Self-pay | Admitting: Physical Therapy

## 2021-02-27 ENCOUNTER — Other Ambulatory Visit: Payer: Self-pay | Admitting: Internal Medicine

## 2021-02-27 ENCOUNTER — Ambulatory Visit (INDEPENDENT_AMBULATORY_CARE_PROVIDER_SITE_OTHER): Payer: Medicare Other | Admitting: Family Medicine

## 2021-02-27 VITALS — BP 132/90 | HR 67 | Ht 63.0 in | Wt 124.0 lb

## 2021-02-27 DIAGNOSIS — M5416 Radiculopathy, lumbar region: Secondary | ICD-10-CM | POA: Diagnosis not present

## 2021-02-27 DIAGNOSIS — E890 Postprocedural hypothyroidism: Secondary | ICD-10-CM

## 2021-02-27 DIAGNOSIS — M25551 Pain in right hip: Secondary | ICD-10-CM

## 2021-02-27 DIAGNOSIS — M6281 Muscle weakness (generalized): Secondary | ICD-10-CM

## 2021-02-27 DIAGNOSIS — R2689 Other abnormalities of gait and mobility: Secondary | ICD-10-CM

## 2021-02-27 DIAGNOSIS — M545 Low back pain, unspecified: Secondary | ICD-10-CM | POA: Diagnosis not present

## 2021-02-27 DIAGNOSIS — R42 Dizziness and giddiness: Secondary | ICD-10-CM

## 2021-02-27 DIAGNOSIS — M542 Cervicalgia: Secondary | ICD-10-CM

## 2021-02-27 DIAGNOSIS — R2681 Unsteadiness on feet: Secondary | ICD-10-CM

## 2021-02-27 NOTE — Assessment & Plan Note (Signed)
Patient recently has been diagnosed with peripheral neuropathy and is undergoing B12 injections.  Patient continues to have weakness noted on the right lower extremity compared to the left.  Patient does have some mild weakness noted with dorsi flexion.  Positive straight leg test at 20 degrees.  At this point I do feel that advanced imaging is warranted.  Because patient's pain is out of proportion to the amount of palpation of the lateral ischial bone I do feel that further evaluation for any type of insufficiency fracture with an MRI of the lumbar pelvis is necessary.  Patient recently has had following both of these 2.  X-rays from outside facility in September of the lumbar spine and patient has failed conservative therapy including formal physical therapy at this time.  Depending on imaging we will discuss the possibility of epidurals.  Follow-up again 6 weeks

## 2021-02-27 NOTE — Patient Instructions (Signed)
MRI lumbar spine [id number] MRI pelvis Will contact you with results

## 2021-02-27 NOTE — Therapy (Signed)
[facility] @ [place], [place], [address] Phone: [phone]   Fax:  [phone]  Physical Therapy Treatment  Patient Details  Name: Kristen [name] MRN: [id number] Date of Birth: 1960/04/19 Referring Provider (PT): Hulan Saas DO   Encounter Date: 02/27/2021   PT End of Session - 02/27/21 1153     Visit Number 12    Date for PT Re-Evaluation 03/10/21    Authorization Type MCR    Progress Note Due on Visit 20    PT Start Time 1152    PT Stop Time 1230    PT Time Calculation (min) 38 min    Activity Tolerance Patient tolerated treatment well    Behavior During Therapy [facility] for tasks assessed/performed             Past Medical History:  Diagnosis Date   Anxiety    Depression    Hair loss    Insomnia    Migraine    Post-surgical hypothyroidism    ?cancer    Past Surgical History:  Procedure Laterality Date   EYE SURGERY     SHOULDER ARTHROSCOPY WITH SUBACROMIAL DECOMPRESSION Right 04/11/2020   Procedure: SHOULDER ARTHROSCOPY WITH SUBACROMIAL DECOMPRESSION;  Surgeon: Tania Ade, MD;  Location: [facility];  Service: Orthopedics;  Laterality: Right;    There were no vitals filed for this visit.   Subjective Assessment - 02/27/21 1155     Subjective Pt saw Dr Tamala Julian this AM. He is concerned about her RT low back/hip. Pt is going to set up MRI today. Pt continues to report RTLE weakness. Pt reports exercise does not exacerbate pain or symptoms and DR feels staying in PT is good. Pt really liked the dry needling to her neck.    Pertinent History anxiety, depression, right RCR    Currently in Pain? No/denies                               [facility] Adult PT Treatment/Exercise - 02/27/21 0001       Neck Exercises: Machines for Strengthening   Nustep L2 x 8 min PTA present to monitor.    Cybex Row 15# 2x10    Lat Pull 25# 2x10    Other Machines for Strengthening  Hip Matrix 25#: hip abduction, hip flexion, hip extension x10B each   TC on RT hip flexion to modify ROM/increase trunk strength     Knee/Hip Exercises: Seated   Clamshell with TheraBand --   yellow loop 2x10 no mat required   Sit to Sand --   chair + 1 mat holding 3# wt 10x                      PT Short Term Goals - 02/20/21 0956       PT SHORT TERM GOAL #1   Title Ind with initial HEP    Status Achieved    Target Date 01/25/21      PT SHORT TERM GOAL #2   Title Able to rise from a chair 1x without UE assist    Status Achieved    Target Date 01/25/21      PT SHORT TERM GOAL #3   Title Pt to have negative Dix-Hallpike to bilat sides.    Time 4    Period Weeks    Status On-going  PT Long Term Goals - 02/20/21 0957       PT LONG TERM GOAL #1   Title Patient able to move her neck without difficulty and pain.    Time 8    Period Weeks    Status Achieved      PT LONG TERM GOAL #2   Title Patient able to look up with full neck extension and no dizziness.    Time 8    Period Weeks    Status Achieved      PT LONG TERM GOAL #3   Title Pt independent with advanced HEP    Time 8    Period Weeks    Status On-going      PT LONG TERM GOAL #4   Title Functional Gait Assessment improved to 26/30    Period Weeks    Status On-going   25     PT LONG TERM GOAL #5   Title 5x sit to stand without UE use 25 sec    Time 8    Period Weeks    Status Achieved   12 sec                  Plan - 02/27/21 1207     Clinical Impression Statement Pt is going to schedule an MRI per MD order to look closer at her RT low back/hip ( per pt report today.) Pt conitnues to complain about RT hip weakness: difficulty getting up off the floor or out/in a car were some examples given today. Pt demonstrated Rt hip flexion weakness during hip flexion exercise, all other motions seemed to be ok.    Personal Factors and Comorbidities Comorbidity 3+     Comorbidities anxiety, depression, dizziness    Examination-Activity Limitations Carry;Stand;Locomotion Level    Examination-Participation Restrictions Community Activity    Stability/Clinical Decision Making Evolving/Moderate complexity    Rehab Potential Good    PT Frequency 2x / week    PT Duration 8 weeks    PT Treatment/Interventions ADLs/Self Care Home Management;Aquatic Therapy;Cryotherapy;Electrical Stimulation;Moist Heat;Traction;Gait Scientist, forensic;Therapeutic activities;Therapeutic exercise;Balance training;Neuromuscular re-education;Manual techniques;Patient/family education;Dry needling;Joint Manipulations;Spinal Manipulations;Canalith Repostioning;Vestibular    PT Next Visit Plan See if pt had any results from MRI    PT Delta for strengthening and Access Code: [email or web address] for vestibular    Consulted and Agree with Plan of Care Patient             [name]  Decreased range of motion, Pain, Dizziness, Decreased strength, Postural dysfunction, Impaired flexibility, Increased muscle spasms  Visit Diagnosis: Dizziness and giddiness  Cervicalgia  Muscle weakness (generalized)  Balance problem  Unsteadiness on feet     Problem List Patient Active Problem List   Diagnosis Date Noted   Lumbar radiculopathy, right 02/27/2021   Posttraumatic headache 11/24/2020   Insomnia 06/21/2020   Major depression, single episode 06/21/2020   Migraine 06/21/2020   Posttraumatic stress disorder 06/21/2020   Anxiety 06/21/2020   Allergic rhinitis 06/21/2020   Hyperacusis of both ears 05/17/2020   Laryngopharyngeal reflux (LPR) 05/17/2020   Right rotator cuff tear 03/17/2020   Excessive physiologic tremor 02/11/2015   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    

## 2021-03-06 ENCOUNTER — Ambulatory Visit: Payer: Medicare Other | Admitting: Physical Therapy

## 2021-03-06 ENCOUNTER — Encounter: Payer: Self-pay | Admitting: Physical Therapy

## 2021-03-06 ENCOUNTER — Other Ambulatory Visit: Payer: Self-pay

## 2021-03-06 DIAGNOSIS — M542 Cervicalgia: Secondary | ICD-10-CM

## 2021-03-06 DIAGNOSIS — R42 Dizziness and giddiness: Secondary | ICD-10-CM | POA: Diagnosis not present

## 2021-03-06 DIAGNOSIS — R2681 Unsteadiness on feet: Secondary | ICD-10-CM

## 2021-03-06 DIAGNOSIS — M6281 Muscle weakness (generalized): Secondary | ICD-10-CM

## 2021-03-06 DIAGNOSIS — R2689 Other abnormalities of gait and mobility: Secondary | ICD-10-CM

## 2021-03-06 NOTE — Therapy (Signed)
[facility] @ [place], [place], [address] Phone: [phone]   Fax:  [phone]  Physical Therapy Treatment  Patient Details  Name: Kristen [name] MRN: [id number] Date of Birth: 18-Nov-1960 Referring Provider (PT): Hulan Saas DO   Encounter Date: 03/06/2021   PT End of Session - 03/06/21 0811     Visit Number 13    Date for PT Re-Evaluation 03/10/21    Authorization Type MCR    Progress Note Due on Visit 20    PT Start Time 0809   pt late   PT Stop Time 0845    PT Time Calculation (min) 36 min    Activity Tolerance Patient tolerated treatment well    Behavior During Therapy [facility] for tasks assessed/performed             Past Medical History:  Diagnosis Date   Anxiety    Depression    Hair loss    Insomnia    Migraine    Post-surgical hypothyroidism    ?cancer    Past Surgical History:  Procedure Laterality Date   EYE SURGERY     SHOULDER ARTHROSCOPY WITH SUBACROMIAL DECOMPRESSION Right 04/11/2020   Procedure: SHOULDER ARTHROSCOPY WITH SUBACROMIAL DECOMPRESSION;  Surgeon: Tania Ade, MD;  Location: [place];  Service: Orthopedics;  Laterality: Right;    There were no vitals filed for this visit.   Subjective Assessment - 03/06/21 0813     Subjective MRI is on Dec 1st. Pt woke up this AM with Rt upper trap pain and stiffness. " i think I slept wrong."    Pertinent History anxiety, depression, right RCR    Currently in Pain? Yes    Pain Score 6     Pain Location Neck    Pain Orientation Right    Pain Descriptors / Indicators Sore;Tightness    Aggravating Factors  Just woke up with it    Pain Relieving Factors Heat, rest, massage    Multiple Pain Sites No                               [facility] Adult PT Treatment/Exercise - 03/06/21 0001       Neck Exercises: Machines for Strengthening   UBE (Upper Arm Bike) L 1.2 3x3 with PTA present    Cybex  Row 15# 2x10: VC to relax UT    Lat Pull 25# 2x10    Other Machines for Strengthening Hip Matrix 25#: hip abduction, hip flexion, hip extension 2x10 each   TC on RT hip flexion to modify ROM/increase trunk strength     Neck Exercises: Stretches   Upper Trapezius Stretch Left;Right;3 reps;20 seconds   VC for technique     Manual Therapy   Manual Therapy Soft tissue mobilization;Myofascial release    Soft tissue mobilization Soft tissue  to Rt upper trap, TP work in upper trap                       PT Short Term Goals - 02/20/21 0956       PT SHORT TERM GOAL #1   Title Ind with initial HEP    Status Achieved    Target Date 01/25/21      PT SHORT TERM GOAL #2   Title Able to rise from a chair 1x without UE assist    Status Achieved    Target Date  01/25/21      PT SHORT TERM GOAL #3   Title Pt to have negative Dix-Hallpike to bilat sides.    Time 4    Period Weeks    Status On-going               PT Long Term Goals - 02/20/21 0957       PT LONG TERM GOAL #1   Title Patient able to move her neck without difficulty and pain.    Time 8    Period Weeks    Status Achieved      PT LONG TERM GOAL #2   Title Patient able to look up with full neck extension and no dizziness.    Time 8    Period Weeks    Status Achieved      PT LONG TERM GOAL #3   Title Pt independent with advanced HEP    Time 8    Period Weeks    Status On-going      PT LONG TERM GOAL #4   Title Functional Gait Assessment improved to 26/30    Period Weeks    Status On-going   25     PT LONG TERM GOAL #5   Title 5x sit to stand without UE use 25 sec    Time 8    Period Weeks    Status Achieved   12 sec                  Plan - 03/06/21 0829     Clinical Impression Statement Pt arrives with a "crick" in her neck/upper trap stiff and sore which she attributes to "sleeping wrong." Pt was able to complete her usual exercises with the addition of neck stretches. Pt has  difficulty contracting her RT glute while i nsingle leg stance ( on hip machine). Pt reports "feeling it in her back" when in this SLS.    Personal Factors and Comorbidities Comorbidity 3+    Comorbidities anxiety, depression, dizziness    Examination-Activity Limitations Carry;Stand;Locomotion Level    Examination-Participation Restrictions Community Activity    Stability/Clinical Decision Making Evolving/Moderate complexity    Rehab Potential Good    PT Frequency 2x / week    PT Duration 8 weeks    PT Treatment/Interventions ADLs/Self Care Home Management;Aquatic Therapy;Cryotherapy;Electrical Stimulation;Moist Heat;Traction;Gait Scientist, forensic;Therapeutic activities;Therapeutic exercise;Balance training;Neuromuscular re-education;Manual techniques;Patient/family education;Dry needling;Joint Manipulations;Spinal Manipulations;Canalith Repostioning;Vestibular    PT Home Exercise Plan [id number] for strengthening and Access Code: [email or web address] for vestibular    Consulted and Agree with Plan of Care Patient             [name]  Decreased range of motion, Pain, Dizziness, Decreased strength, Postural dysfunction, Impaired flexibility, Increased muscle spasms  Visit Diagnosis: Dizziness and giddiness  Cervicalgia  Muscle weakness (generalized)  Balance problem  Unsteadiness on feet     Problem List Patient Active Problem List   Diagnosis Date Noted   Lumbar radiculopathy, right 02/27/2021   Posttraumatic headache 11/24/2020   Insomnia 06/21/2020   Major depression, single episode 06/21/2020   Migraine 06/21/2020   Posttraumatic stress disorder 06/21/2020   Anxiety 06/21/2020   Allergic rhinitis 06/21/2020   Hyperacusis of both ears 05/17/2020   Laryngopharyngeal reflux (LPR) 05/17/2020   Right rotator cuff tear 03/17/2020   Excessive physiologic tremor  02/11/2015   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/14/2014    Shamaine Mulkern, PTA 03/06/2021, 8:46 AM  [facility] @ [place], [place], [address] Phone: [phone]   Fax:  [phone]  Name: Kristen [name] MRN: [id number] Date of Birth: 1960-08-22

## 2021-03-07 ENCOUNTER — Other Ambulatory Visit (INDEPENDENT_AMBULATORY_CARE_PROVIDER_SITE_OTHER): Payer: Medicare Other

## 2021-03-07 DIAGNOSIS — E890 Postprocedural hypothyroidism: Secondary | ICD-10-CM

## 2021-03-07 LAB — TSH: TSH: 6.77 u[IU]/mL — ABNORMAL HIGH (ref 0.35–5.50)

## 2021-03-07 LAB — T4, FREE: Free T4: 0.75 ng/dL (ref 0.60–1.60)

## 2021-03-07 MED ORDER — LEVOTHYROXINE SODIUM 112 MCG PO TABS: 112.0000 ug | ORAL_TABLET | Freq: Every day | ORAL | 3 refills | Status: DC

## 2021-03-08 ENCOUNTER — Encounter: Payer: Self-pay | Admitting: Rehabilitative and Restorative Service Providers"

## 2021-03-08 ENCOUNTER — Other Ambulatory Visit: Payer: Self-pay

## 2021-03-08 ENCOUNTER — Ambulatory Visit: Payer: Medicare Other | Admitting: Rehabilitative and Restorative Service Providers"

## 2021-03-08 DIAGNOSIS — R2681 Unsteadiness on feet: Secondary | ICD-10-CM

## 2021-03-08 DIAGNOSIS — R42 Dizziness and giddiness: Secondary | ICD-10-CM | POA: Diagnosis not present

## 2021-03-08 DIAGNOSIS — R2689 Other abnormalities of gait and mobility: Secondary | ICD-10-CM

## 2021-03-08 DIAGNOSIS — M542 Cervicalgia: Secondary | ICD-10-CM

## 2021-03-08 DIAGNOSIS — M6281 Muscle weakness (generalized): Secondary | ICD-10-CM

## 2021-03-08 NOTE — Therapy (Signed)
[facility] @ [place], [place], [address] Phone: [phone]   Fax:  [phone]  Physical Therapy Treatment and Reassessment  Patient Details  Name: Kristen [name] MRN: [id number] Date of Birth: [birth year] Referring Provider (PT): Hulan Saas DO   Encounter Date: 03/08/2021  Progress Note  See note below for Objective Data and Assessment of Progress/Goals.       PT End of Session - 03/08/21 0821     Visit Number 14    Date for PT Re-Evaluation 04/07/21    Authorization Type MCR    Progress Note Due on Visit 20    PT Start Time [phone]   pt arrived late for appointment   PT Stop Time 0845    PT Time Calculation (min) 29 min    Activity Tolerance Patient tolerated treatment well    Behavior During Therapy [facility] for tasks assessed/performed             Past Medical History:  Diagnosis Date   Anxiety    Depression    Hair loss    Insomnia    Migraine    Post-surgical hypothyroidism    ?cancer    Past Surgical History:  Procedure Laterality Date   EYE SURGERY     SHOULDER ARTHROSCOPY WITH SUBACROMIAL DECOMPRESSION Right 04/11/2020   Procedure: SHOULDER ARTHROSCOPY WITH SUBACROMIAL DECOMPRESSION;  Surgeon: Tania Ade, MD;  Location: [facility];  Service: Orthopedics;  Laterality: Right;    There were no vitals filed for this visit.   Subjective Assessment - 03/08/21 0818     Subjective I found out that my thyroid levels are off, that is why I have been feeling so tired.    Patient Stated Goals more movement in her neck    Currently in Pain? Yes    Pain Score 4     Pain Location Neck    Pain Orientation Right    Pain Descriptors / Indicators Aching                [facility] PT Assessment - 03/08/21 0001       Assessment   Medical Diagnosis neck pain and dizziness    Referring Provider (PT) Hulan Saas DO      Balance Screen   Has the patient fallen in the  past 6 months Yes      Prior Function   Level of Independence Independent with basic ADLs    Vocation Retired    Leisure walking      Cognition   Overall Cognitive Status Within Functional Limits for tasks assessed      Standardized Balance Assessment   Standardized Balance Assessment Berg Balance Test      Berg Balance Test   Sit to Stand Able to stand without using hands and stabilize independently    Standing Unsupported Able to stand safely 2 minutes    Sitting with Back Unsupported but Feet Supported on Floor or Stool Able to sit safely and securely 2 minutes    Stand to Sit Sits safely with minimal use of hands    Transfers Able to transfer safely, minor use of hands    Standing Unsupported with Eyes Closed Able to stand 10 seconds safely    Standing Unsupported with Feet Together Able to place feet together independently and stand 1 minute safely    From Standing, Reach Forward with Outstretched Arm Can reach confidently >25 cm (10")    From Standing Position,  Pick up Object from [place] to pick up shoe safely and easily    From Standing Position, Turn to Look Behind Over each Shoulder Looks behind from both sides and weight shifts well    Turn 360 Degrees Able to turn 360 degrees safely in 4 seconds or less    Standing Unsupported, Alternately Place Feet on Step/Stool Able to stand independently and safely and complete 8 steps in 20 seconds    Standing Unsupported, One Foot in [place] to place foot tandem independently and hold 30 seconds    Standing on One Leg Able to lift leg independently and hold 5-10 seconds    Total Score 55      Functional Gait  Assessment   Gait assessed  Yes    Gait Level Surface Walks 20 ft in less than 5.5 sec, no assistive devices, good speed, no evidence for imbalance, normal gait pattern, deviates no more than 6 in outside of the 12 in walkway width.    Change in Gait Speed Able to smoothly change walking speed without loss of balance or  gait deviation. Deviate no more than 6 in outside of the 12 in walkway width.    Gait with Horizontal Head Turns Performs head turns smoothly with no change in gait. Deviates no more than 6 in outside 12 in walkway width    Gait with Vertical Head Turns Performs head turns with no change in gait. Deviates no more than 6 in outside 12 in walkway width.    Gait and Pivot Turn Pivot turns safely within 3 sec and stops quickly with no loss of balance.    Step Over Obstacle Is able to step over one shoe box (4.5 in total height) without changing gait speed. No evidence of imbalance.    Gait with Narrow Base of Support Ambulates 7-9 steps.    Gait with Eyes Closed Walks 20 ft, uses assistive device, slower speed, mild gait deviations, deviates 6-10 in outside 12 in walkway width. Ambulates 20 ft in less than 9 sec but greater than 7 sec.    Ambulating Backwards Walks 20 ft, uses assistive device, slower speed, mild gait deviations, deviates 6-10 in outside 12 in walkway width.    Steps Alternating feet, must use rail.    Total Score 25                           [facility] Adult PT Treatment/Exercise - 03/08/21 0001       Transfers   Transfers Floor to Transfer    Five time sit to stand comments  13.6    Floor to Transfer 4: Min guard      Ambulation/Gait   Stairs Yes    Stairs Assistance 6: Modified independent (Device/Increase time)    Stair Management Technique No rails;Alternating pattern;Step to pattern    Number of Stairs 12      Neck Exercises: Machines for Strengthening   UBE (Upper Arm Bike) L 2.0, 2 min each way      Manual Therapy   Manual Therapy Soft tissue mobilization;Myofascial release    Manual therapy comments in prone    Soft tissue mobilization to R trap.  Small bruise noted on R upper trap and avoided that area.    Myofascial Release trigger point release to R cervical multifidi and R upper trap              Trigger Point Dry Needling -  03/08/21 0001      Consent Given? Yes    Education Handout Provided Yes    Muscles Treated Head and Neck Upper trapezius;Cervical multifidi    Upper Trapezius Response Twitch reponse elicited;Palpable increased muscle length    Cervical multifidi Response Twitch reponse elicited;Palpable increased muscle length                     PT Short Term Goals - 03/08/21 0826       PT SHORT TERM GOAL #1   Title Ind with initial HEP    Status Achieved      PT SHORT TERM GOAL #2   Title Able to rise from a chair 1x without UE assist    Status Achieved      PT SHORT TERM GOAL #3   Title Pt to have negative Dix-Hallpike to bilat sides.    Status Achieved   Negative B sides              PT Long Term Goals - 03/08/21 0845       PT LONG TERM GOAL #1   Title Patient able to move her neck without difficulty and pain.    Status Partially Met      PT LONG TERM GOAL #2   Title Pt able to perform floor to standing transfer independently to allow for fall recovery.    Time 4    Period Weeks    Status New      PT LONG TERM GOAL #3   Title Pt independent with advanced HEP    Status Partially Met      PT LONG TERM GOAL #4   Title Functional Gait Assessment improved to 26/30    Status Partially Met      PT LONG TERM GOAL #5   Title Pt able to negotiate 12 steps with reciprocal pattern without a rail to allow her to perform stairs at her home and at friend's homes.    Time 4    Period Weeks    Status New                   Plan - 03/08/21 0902     Clinical Impression Statement Pt is progressing towards previous goals and has met or nearly met most of original goals, however, pt reports that she continues to have difficulty with steps and difficulty rising from the floor to standing. Pt is no longer having dizziness as she did previously, and was negative with [employer] assessment. Pt requires increased time and cuing with CGA to perform floor to standing transfer.  On stair  negotiation, pt is able to negotiate steps with a combination of recirprocal pattern and step to pattern. Goals updated to reflect pts current needs and pt would benefit from continued PT of 2x/week for 4 additional weeks.    Personal Factors and Comorbidities Comorbidity 3+    Comorbidities anxiety, depression, dizziness    Examination-Activity Limitations Carry;Stand;Locomotion Level    Examination-Participation Restrictions Community Activity    Stability/Clinical Decision Making Evolving/Moderate complexity    Clinical Decision Making Moderate    Rehab Potential Good    PT Frequency 2x / week    PT Duration 4 weeks    PT Treatment/Interventions ADLs/Self Care Home Management;Aquatic Therapy;Cryotherapy;Electrical Stimulation;Moist Heat;Traction;Gait Scientist, forensic;Therapeutic activities;Therapeutic exercise;Balance training;Neuromuscular re-education;Manual techniques;Patient/family education;Dry needling;Joint Manipulations;Spinal Manipulations;Canalith Repostioning;Vestibular    PT Next Visit Plan progress strengthening and balance    Consulted and Agree with Plan  of Care Patient             [name]  Decreased range of motion, Pain, Dizziness, Decreased strength, Postural dysfunction, Impaired flexibility, Increased muscle spasms  Visit Diagnosis: Cervicalgia - Plan: PT plan of care cert/re-cert  Muscle weakness (generalized) - Plan: PT plan of care cert/re-cert  Balance problem - Plan: PT plan of care cert/re-cert  Unsteadiness on feet - Plan: PT plan of care cert/re-cert  Dizziness and giddiness - Plan: PT plan of care cert/re-cert     Problem List Patient Active Problem List   Diagnosis Date Noted   Lumbar radiculopathy, right 02/27/2021   Posttraumatic headache 11/24/2020   Insomnia 06/21/2020   Major depression, single episode 06/21/2020    Migraine 06/21/2020   Posttraumatic stress disorder 06/21/2020   Anxiety 06/21/2020   Allergic rhinitis 06/21/2020   Hyperacusis of both ears 05/17/2020   Laryngopharyngeal reflux (LPR) 05/17/2020   Right rotator cuff tear 03/17/2020   Excessive physiologic tremor 02/11/2015   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/14/2014    Juel Burrow, PT, DPT 03/08/2021, 9:16 AM  [facility] @ [place], [place], [address] Phone: [phone]   Fax:  [phone]  Name: Terea Neubauer MRN: [id number] Date of Birth: 12-20-1960

## 2021-03-14 ENCOUNTER — Encounter: Payer: Self-pay | Admitting: Rehabilitative and Restorative Service Providers"

## 2021-03-14 ENCOUNTER — Other Ambulatory Visit: Payer: Self-pay

## 2021-03-14 ENCOUNTER — Ambulatory Visit: Payer: Medicare Other | Attending: Family Medicine | Admitting: Rehabilitative and Restorative Service Providers"

## 2021-03-14 DIAGNOSIS — R2681 Unsteadiness on feet: Secondary | ICD-10-CM | POA: Insufficient documentation

## 2021-03-14 DIAGNOSIS — M542 Cervicalgia: Secondary | ICD-10-CM | POA: Insufficient documentation

## 2021-03-14 DIAGNOSIS — R42 Dizziness and giddiness: Secondary | ICD-10-CM | POA: Diagnosis present

## 2021-03-14 DIAGNOSIS — M6281 Muscle weakness (generalized): Secondary | ICD-10-CM | POA: Diagnosis present

## 2021-03-14 DIAGNOSIS — R2689 Other abnormalities of gait and mobility: Secondary | ICD-10-CM | POA: Insufficient documentation

## 2021-03-14 NOTE — Therapy (Signed)
[facility] @ [place], [place], [address] Phone: [phone]   Fax:  [phone]  Physical Therapy Treatment  Patient Details  Name: Kristen [name] MRN: [id number] Date of Birth: [birth year] Referring Provider (PT): Hulan Saas DO   Encounter Date: 03/14/2021   PT End of Session - 03/14/21 0928     Visit Number 15    Date for PT Re-Evaluation 04/07/21    Authorization Type MCR    PT Start Time 0849    PT Stop Time 0929    PT Time Calculation (min) 40 min    Activity Tolerance Patient tolerated treatment well    Behavior During Therapy [facility] for tasks assessed/performed             Past Medical History:  Diagnosis Date   Anxiety    Depression    Hair loss    Insomnia    Migraine    Post-surgical hypothyroidism    ?cancer    Past Surgical History:  Procedure Laterality Date   EYE SURGERY     SHOULDER ARTHROSCOPY WITH SUBACROMIAL DECOMPRESSION Right 04/11/2020   Procedure: SHOULDER ARTHROSCOPY WITH SUBACROMIAL DECOMPRESSION;  Surgeon: Tania Ade, MD;  Location: [facility];  Service: Orthopedics;  Laterality: Right;    There were no vitals filed for this visit.   Subjective Assessment - 03/14/21 0900     Subjective I was able to walk more this weekend. Pt reports feeling better now that thyroid levels are being adjusted.    Pertinent History anxiety, depression, right RCR    Patient Stated Goals more movement in her neck    Currently in Pain? Yes    Pain Score 3     Pain Location Neck                               [facility] Adult PT Treatment/Exercise - 03/14/21 0001       High Level Balance   High Level Balance Comments SLS on blue foam with CGA      Neck Exercises: Machines for Strengthening   Nustep L3 x 6 min PTA present to monitor.    Cybex Row 15# 2x10: VC to relax UT    Lat Pull 25# x10, 30# x10    Other Machines for Strengthening Hip Matrix  25#: hip abduction, hip flexion, hip extension 2x10 each    Other Machines for Strengthening Leg Press 50# 2x10, seat at 5      Neck Exercises: Standing   Other Standing Exercises Diagonals, horizontal abd, x10 each with red tband standing on blue foam    Other Standing Exercises Step up x10B from 6" step without UE use      Neck Exercises: Seated   Other Seated Exercise Sit to/from stand x10 reps holding 5# kettlebell                       PT Short Term Goals - 03/08/21 0826       PT SHORT TERM GOAL #1   Title Ind with initial HEP    Status Achieved      PT SHORT TERM GOAL #2   Title Able to rise from a chair 1x without UE assist    Status Achieved      PT SHORT TERM GOAL #3   Title Pt to have negative Dix-Hallpike to bilat sides.    Status  Achieved   Negative B sides              PT Long Term Goals - 03/14/21 0933       PT LONG TERM GOAL #1   Title Patient able to move her neck without difficulty and pain.    Status Partially Met      PT LONG TERM GOAL #2   Title Pt able to perform floor to standing transfer independently to allow for fall recovery.    Status On-going      PT LONG TERM GOAL #3   Title Pt independent with advanced HEP    Status Partially Met      PT LONG TERM GOAL #5   Title Pt able to negotiate 12 steps with reciprocal pattern without a rail to allow her to perform stairs at her home and at friend's homes.    Status On-going                   Plan - 03/14/21 0930     Clinical Impression Statement Pt continues to have no complaints of dizziness during session. Pt able to progress with LE strengthening exercises.  Has increased difficulty with sit to/from stand with holding 5# kettlebell.  Pt tolerated leg press machine well and without complaints. Pt with some difficulty with step up exercise, but able to complete without UE support.    PT Treatment/Interventions ADLs/Self Care Home Management;Aquatic  Therapy;Cryotherapy;Electrical Stimulation;Moist Heat;Traction;Gait Scientist, forensic;Therapeutic activities;Therapeutic exercise;Balance training;Neuromuscular re-education;Manual techniques;Patient/family education;Dry needling;Joint Manipulations;Spinal Manipulations;Canalith Repostioning;Vestibular    PT Next Visit Plan progress strengthening and balance    Consulted and Agree with Plan of Care Patient             [name]  Decreased range of motion, Pain, Dizziness, Decreased strength, Postural dysfunction, Impaired flexibility, Increased muscle spasms  Visit Diagnosis: Cervicalgia  Muscle weakness (generalized)  Balance problem     Problem List Patient Active Problem List   Diagnosis Date Noted   Lumbar radiculopathy, right 02/27/2021   Posttraumatic headache 11/24/2020   Insomnia 06/21/2020   Major depression, single episode 06/21/2020   Migraine 06/21/2020   Posttraumatic stress disorder 06/21/2020   Anxiety 06/21/2020   Allergic rhinitis 06/21/2020   Hyperacusis of both ears 05/17/2020   Laryngopharyngeal reflux (LPR) 05/17/2020   Right rotator cuff tear 03/17/2020   Excessive physiologic tremor 02/11/2015   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/14/2014    Juel Burrow, PT, DPT 03/14/2021, 9:34 AM  [facility] @ [place], [place], [address] Phone: [phone]   Fax:  [phone]  Name: Carrieann Spielberg MRN: [id number] Date of Birth: [birth year]

## 2021-03-15 ENCOUNTER — Encounter: Payer: Self-pay | Admitting: Family Medicine

## 2021-03-15 ENCOUNTER — Ambulatory Visit
Admission: RE | Admit: 2021-03-15 | Discharge: 2021-03-15 | Disposition: A | Discharge status: Home / Self Care | Payer: Medicare Other | Source: Ambulatory Visit | Attending: Family Medicine | Admitting: Family Medicine

## 2021-03-15 DIAGNOSIS — M545 Low back pain, unspecified: Secondary | ICD-10-CM

## 2021-03-15 DIAGNOSIS — M25551 Pain in right hip: Secondary | ICD-10-CM

## 2021-03-16 ENCOUNTER — Other Ambulatory Visit: Payer: Self-pay

## 2021-03-16 ENCOUNTER — Encounter: Payer: Self-pay | Admitting: Rehabilitative and Restorative Service Providers"

## 2021-03-16 ENCOUNTER — Ambulatory Visit: Payer: Medicare Other | Admitting: Rehabilitative and Restorative Service Providers"

## 2021-03-16 DIAGNOSIS — R42 Dizziness and giddiness: Secondary | ICD-10-CM

## 2021-03-16 DIAGNOSIS — M6281 Muscle weakness (generalized): Secondary | ICD-10-CM

## 2021-03-16 DIAGNOSIS — M542 Cervicalgia: Secondary | ICD-10-CM

## 2021-03-16 DIAGNOSIS — R2689 Other abnormalities of gait and mobility: Secondary | ICD-10-CM

## 2021-03-16 DIAGNOSIS — R102 Pelvic and perineal pain: Secondary | ICD-10-CM

## 2021-03-16 NOTE — Therapy (Signed)
[place] @ [address] [place], [place], [address] Phone: [phone]   Fax:  [phone]  Physical Therapy Treatment  Patient Details  Name: Kristen [name] MRN: [id number] Date of Birth: 05/23/1960 Referring Provider (PT): Hulan Saas DO   Encounter Date: 03/16/2021   PT End of Session - 03/16/21 0855     Visit Number 16    Date for PT Re-Evaluation 04/07/21    Authorization Type MCR    Progress Note Due on Visit 20    PT Start Time 0851    PT Stop Time 0930    PT Time Calculation (min) 39 min    Activity Tolerance Patient tolerated treatment well    Behavior During Therapy [facility] for tasks assessed/performed             Past Medical History:  Diagnosis Date   Anxiety    Depression    Hair loss    Insomnia    Migraine    Post-surgical hypothyroidism    ?cancer    Past Surgical History:  Procedure Laterality Date   EYE SURGERY     SHOULDER ARTHROSCOPY WITH SUBACROMIAL DECOMPRESSION Right 04/11/2020   Procedure: SHOULDER ARTHROSCOPY WITH SUBACROMIAL DECOMPRESSION;  Surgeon: Tania Ade, MD;  Location: [facility];  Service: Orthopedics;  Laterality: Right;    There were no vitals filed for this visit.   Subjective Assessment - 03/16/21 0853     Subjective Pt reports a little low back soreness, but otherwise okay.  States that her pelvis and lumbar MRI were good.    Pertinent History anxiety, depression, right RCR    Diagnostic tests lumbar and pelvic MRI revealed some lower lumbar degeneration, but no nerve impingement.    Patient Stated Goals more movement in her neck    Currently in Pain? No/denies                               [facility] Adult PT Treatment/Exercise - 03/16/21 0001       Neck Exercises: Machines for Strengthening   Nustep L4 x 6 min PT present to monitor.   600 steps   Cybex Row 15# 2x10: VC to relax UT    Lat Pull 30# 2x10    Other Machines  for Strengthening Hip Matrix 30#: hip abduction, hip flexion, hip extension 1x10 each    Other Machines for Strengthening Leg Press 60# 2x10, seat at 5      Manual Therapy   Manual Therapy Soft tissue mobilization;Myofascial release    Soft tissue mobilization to R trap and rhomboids    Myofascial Release trigger point release to R rhomboid and R upper trap              Trigger Point Dry Needling - 03/16/21 0001     Consent Given? Yes    Education Handout Provided Previously provided    Muscles Treated Head and Neck Upper trapezius    Muscles Treated Upper Quadrant Rhomboids    Upper Trapezius Response Twitch reponse elicited;Palpable increased muscle length    Rhomboids Response Twitch response elicited;Palpable increased muscle length                     PT Short Term Goals - 03/08/21 0131       PT SHORT TERM GOAL #1   Title Ind with initial HEP    Status Achieved  PT SHORT TERM GOAL #2   Title Able to rise from a chair 1x without UE assist    Status Achieved      PT SHORT TERM GOAL #3   Title Pt to have negative Dix-Hallpike to bilat sides.    Status Achieved   Negative B sides              PT Long Term Goals - 03/16/21 0941       PT LONG TERM GOAL #1   Title Patient able to move her neck without difficulty and pain.    Status Partially Met      PT LONG TERM GOAL #2   Title Pt able to perform floor to standing transfer independently to allow for fall recovery.    Status On-going      PT LONG TERM GOAL #3   Title Pt independent with advanced HEP    Status Partially Met                   Plan - 03/16/21 0938     Clinical Impression Statement Pt states that she has only had one minor incident of dizziness, but feels that she may have moved too quickly.  Otherwise, pt states that she is feeling better, she states that she still feels some tightness in her R upper trap area and that dry needling helped with that in the past. Pt  able to increase weight on lat pull exercise and is improving with her posture during ther ex requiring less cuing to correct during ther ex.    PT Frequency 2x / week    PT Duration 4 weeks    PT Treatment/Interventions ADLs/Self Care Home Management;Aquatic Therapy;Cryotherapy;Electrical Stimulation;Moist Heat;Traction;Gait Scientist, forensic;Therapeutic activities;Therapeutic exercise;Balance training;Neuromuscular re-education;Manual techniques;Patient/family education;Dry needling;Joint Manipulations;Spinal Manipulations;Canalith Repostioning;Vestibular    PT Next Visit Plan progress strengthening and balance    Consulted and Agree with Plan of Care Patient             [name]  Decreased range of motion, Pain, Dizziness, Decreased strength, Postural dysfunction, Impaired flexibility, Increased muscle spasms  Visit Diagnosis: Cervicalgia  Muscle weakness (generalized)  Balance problem  Dizziness and giddiness     Problem List Patient Active Problem List   Diagnosis Date Noted   Lumbar radiculopathy, right 02/27/2021   Posttraumatic headache 11/24/2020   Insomnia 06/21/2020   Major depression, single episode 06/21/2020   Migraine 06/21/2020   Posttraumatic stress disorder 06/21/2020   Anxiety 06/21/2020   Allergic rhinitis 06/21/2020   Hyperacusis of both ears 05/17/2020   Laryngopharyngeal reflux (LPR) 05/17/2020   Right rotator cuff tear 03/17/2020   Excessive physiologic tremor 02/11/2015   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

## 2021-03-20 ENCOUNTER — Other Ambulatory Visit: Payer: Self-pay

## 2021-03-20 ENCOUNTER — Encounter: Payer: Self-pay | Admitting: Rehabilitative and Restorative Service Providers"

## 2021-03-20 ENCOUNTER — Ambulatory Visit: Payer: Medicare Other | Admitting: Rehabilitative and Restorative Service Providers"

## 2021-03-20 DIAGNOSIS — M542 Cervicalgia: Secondary | ICD-10-CM

## 2021-03-20 DIAGNOSIS — R2689 Other abnormalities of gait and mobility: Secondary | ICD-10-CM

## 2021-03-20 DIAGNOSIS — M6281 Muscle weakness (generalized): Secondary | ICD-10-CM

## 2021-03-20 NOTE — Therapy (Signed)
[place] @ [address] [place], [place], [address] Phone: [phone]   Fax:  [phone]  Physical Therapy Treatment  Patient Details  Name: Kristen [name] MRN: [id number] Date of Birth: [birth year] Referring Provider (PT): Hulan Saas DO   Encounter Date: 03/20/2021   PT End of Session - 03/20/21 0803     Visit Number 17    Date for PT Re-Evaluation 04/07/21    Authorization Type MCR    Progress Note Due on Visit 20    PT Start Time 0759    PT Stop Time 0840    PT Time Calculation (min) 41 min    Activity Tolerance Patient tolerated treatment well    Behavior During Therapy [facility] for tasks assessed/performed             Past Medical History:  Diagnosis Date   Anxiety    Depression    Hair loss    Insomnia    Migraine    Post-surgical hypothyroidism    ?cancer    Past Surgical History:  Procedure Laterality Date   EYE SURGERY     SHOULDER ARTHROSCOPY WITH SUBACROMIAL DECOMPRESSION Right 04/11/2020   Procedure: SHOULDER ARTHROSCOPY WITH SUBACROMIAL DECOMPRESSION;  Surgeon: Tania Ade, MD;  Location: [facility];  Service: Orthopedics;  Laterality: Right;    There were no vitals filed for this visit.   Subjective Assessment - 03/20/21 0802     Subjective Pt reports no new complaints.    Pertinent History anxiety, depression, right RCR    Patient Stated Goals more movement in her neck    Currently in Pain? Yes    Pain Score 3     Pain Location Back    Pain Orientation Lower    Pain Descriptors / Indicators Sore                               [facility] Adult PT Treatment/Exercise - 03/20/21 0001       Neck Exercises: Machines for Strengthening   Nustep L4 x 6 min PT present to monitor.   411 steps   Cybex Row 15# 2x10: VC to relax UT    Lat Pull 30# 2x10    Other Machines for Strengthening Hip Matrix 30#: hip abduction, hip flexion, hip extension 1x10 each     Other Machines for Strengthening Leg Press 60# 2x10, seat at 5      Neck Exercises: Standing   Other Standing Exercises Step up and lateral step up x10B from 6" step without UE use for fwd, occasional unilateral UE use for lateral.      Neck Exercises: Seated   Other Seated Exercise Sit to/from stand x10 reps holding 5# kettlebell      Neck Exercises: Supine   Other Supine Exercise Quadruped alt UE/LE extension x10B      Neck Exercises: Prone   Plank 5 x 15 sec      Neck Exercises: Stretches   Upper Trapezius Stretch Right;Left;1 rep;10 seconds    Levator Stretch Right;Left;2 reps;20 seconds   cuing needed for technique   Other Neck Stretches Green pball roll out 5 x5 sec    Other Neck Stretches Mad cat 5 sec x5                       PT Short Term Goals - 03/08/21 5361  PT SHORT TERM GOAL #1   Title Ind with initial HEP    Status Achieved      PT SHORT TERM GOAL #2   Title Able to rise from a chair 1x without UE assist    Status Achieved      PT SHORT TERM GOAL #3   Title Pt to have negative Dix-Hallpike to bilat sides.    Status Achieved   Negative B sides              PT Long Term Goals - 03/20/21 7096       PT LONG TERM GOAL #1   Title Patient able to move her neck without difficulty and pain.    Status Partially Met      PT LONG TERM GOAL #2   Title Pt able to perform floor to standing transfer independently to allow for fall recovery.    Status On-going      PT LONG TERM GOAL #3   Title Pt independent with advanced HEP    Status Partially Met                   Plan - 03/20/21 0842     Clinical Impression Statement Pt continues to report towards goal related activities.  She has difficulty with thoracic extension, so added some thoracic/lumbar stretching to assist. Pt able to progress with adding stabilization of core. Pt required min cuing during plank for technique.    Personal Factors and Comorbidities Comorbidity 3+     Comorbidities anxiety, depression, dizziness    PT Treatment/Interventions ADLs/Self Care Home Management;Aquatic Therapy;Cryotherapy;Electrical Stimulation;Moist Heat;Traction;Gait Scientist, forensic;Therapeutic activities;Therapeutic exercise;Balance training;Neuromuscular re-education;Manual techniques;Patient/family education;Dry needling;Joint Manipulations;Spinal Manipulations;Canalith Repostioning;Vestibular    PT Next Visit Plan progress strengthening and balance    Consulted and Agree with Plan of Care Patient             [name]  Decreased range of motion, Pain, Dizziness, Decreased strength, Postural dysfunction, Impaired flexibility, Increased muscle spasms  Visit Diagnosis: Cervicalgia  Muscle weakness (generalized)  Balance problem     Problem List Patient Active Problem List   Diagnosis Date Noted   Lumbar radiculopathy, right 02/27/2021   Posttraumatic headache 11/24/2020   Insomnia 06/21/2020   Major depression, single episode 06/21/2020   Migraine 06/21/2020   Posttraumatic stress disorder 06/21/2020   Anxiety 06/21/2020   Allergic rhinitis 06/21/2020   Hyperacusis of both ears 05/17/2020   Laryngopharyngeal reflux (LPR) 05/17/2020   Right rotator cuff tear 03/17/2020   Excessive physiologic tremor 02/11/2015   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/14/2014    Juel Burrow, PT, DPT 03/20/2021, 8:53 AM  [facility] @ [address] [place], [place], [address] Phone: [phone]   Fax:  [phone]  Name: Kristen [name] MRN: [id number] Date of Birth: June 16, 1960

## 2021-03-23 ENCOUNTER — Ambulatory Visit: Payer: Medicare Other | Admitting: Rehabilitative and Restorative Service Providers"

## 2021-03-23 ENCOUNTER — Other Ambulatory Visit: Payer: Self-pay

## 2021-03-23 ENCOUNTER — Encounter: Payer: Self-pay | Admitting: Rehabilitative and Restorative Service Providers"

## 2021-03-23 DIAGNOSIS — M6281 Muscle weakness (generalized): Secondary | ICD-10-CM

## 2021-03-23 DIAGNOSIS — M542 Cervicalgia: Secondary | ICD-10-CM | POA: Diagnosis not present

## 2021-03-23 DIAGNOSIS — R2689 Other abnormalities of gait and mobility: Secondary | ICD-10-CM

## 2021-03-23 NOTE — Therapy (Signed)
[place] @ [place] [place], [place], [address] Phone: [phone]   Fax:  [phone]  Physical Therapy Treatment  Patient Details  Name: Kristen [name] MRN: [id number] Date of Birth: [birth year] Referring Provider (PT): Hulan Saas DO   Encounter Date: 03/23/2021   PT End of Session - 03/23/21 0807     Visit Number 18    Date for PT Re-Evaluation 04/07/21    Authorization Type MCR    Progress Note Due on Visit 20    PT Start Time 0800    PT Stop Time 0840    PT Time Calculation (min) 40 min    Activity Tolerance Patient tolerated treatment well    Behavior During Therapy [facility] for tasks assessed/performed             Past Medical History:  Diagnosis Date   Anxiety    Depression    Hair loss    Insomnia    Migraine    Post-surgical hypothyroidism    ?cancer    Past Surgical History:  Procedure Laterality Date   EYE SURGERY     SHOULDER ARTHROSCOPY WITH SUBACROMIAL DECOMPRESSION Right 04/11/2020   Procedure: SHOULDER ARTHROSCOPY WITH SUBACROMIAL DECOMPRESSION;  Surgeon: Tania Ade, MD;  Location: [facility];  Service: Orthopedics;  Laterality: Right;    There were no vitals filed for this visit.   Subjective Assessment - 03/23/21 0805     Subjective I am hurting a lot today    Pertinent History anxiety, depression, right RCR    Patient Stated Goals more movement in her neck    Currently in Pain? Yes    Pain Score 6     Pain Location Neck    Pain Descriptors / Indicators Sore    Pain Type Chronic pain                               [facility] Adult PT Treatment/Exercise - 03/23/21 0001       Ambulation/Gait   Stairs Yes    Stairs Assistance 7: Independent    Stair Management Technique No rails;Alternating pattern    Number of Stairs 8      Neck Exercises: Machines for Strengthening   Nustep L5 x 6 min PT present to monitor.   505 steps   Cybex Row  15# 2x10: VC to relax UT    Lat Pull 30# 2x10    Other Machines for Strengthening Hip Matrix 30#: hip abduction, hip flexion, hip extension 1x10 each    Other Machines for Strengthening Leg Press 60# 2x10, seat at 5      Neck Exercises: Standing   Other Standing Exercises Backweards ressisted walking with 5# x10 reps    Other Standing Exercises Step up and lateral step up x10B from 6" step without UE use for fwd, occasional unilateral UE use for lateral.      Neck Exercises: Stretches   Other Neck Stretches Green pball 3 way roll out 5 x5 sec      Manual Therapy   Manual Therapy Soft tissue mobilization;Myofascial release    Manual therapy comments in prone    Soft tissue mobilization to B traps and R cervical multifidi    Myofascial Release trigger point release to R cervical multifidi and B upper trap              Trigger Point Dry Needling - 03/23/21 0001  Consent Given? Yes    Education Handout Provided Previously provided    Muscles Treated Head and Neck Upper trapezius;Cervical multifidi    Upper Trapezius Response Twitch reponse elicited;Palpable increased muscle length    Cervical multifidi Response Twitch reponse elicited;Palpable increased muscle length                     PT Short Term Goals - 03/08/21 0826       PT SHORT TERM GOAL #1   Title Ind with initial HEP    Status Achieved      PT SHORT TERM GOAL #2   Title Able to rise from a chair 1x without UE assist    Status Achieved      PT SHORT TERM GOAL #3   Title Pt to have negative Dix-Hallpike to bilat sides.    Status Achieved   Negative B sides              PT Long Term Goals - 03/20/21 1779       PT LONG TERM GOAL #1   Title Patient able to move her neck without difficulty and pain.    Status Partially Met      PT LONG TERM GOAL #2   Title Pt able to perform floor to standing transfer independently to allow for fall recovery.    Status On-going      PT LONG TERM GOAL  #3   Title Pt independent with advanced HEP    Status Partially Met                   Plan - 03/23/21 0840     Clinical Impression Statement Pt continues to progress with improved strength and improved ability to navigate the steps.  Pt able to complete x2 laps on steps without UE support with reciprocal pattern. Pt able to perform backwards walking with weight without assistance and with only minor cuing for posture.    PT Treatment/Interventions ADLs/Self Care Home Management;Aquatic Therapy;Cryotherapy;Electrical Stimulation;Moist Heat;Traction;Gait Scientist, forensic;Therapeutic activities;Therapeutic exercise;Balance training;Neuromuscular re-education;Manual techniques;Patient/family education;Dry needling;Joint Manipulations;Spinal Manipulations;Canalith Repostioning;Vestibular    PT Next Visit Plan progress strengthening, scapular stabilization and balance    Consulted and Agree with Plan of Care Patient             [name]  Decreased range of motion, Pain, Dizziness, Decreased strength, Postural dysfunction, Impaired flexibility, Increased muscle spasms  Visit Diagnosis: Cervicalgia  Muscle weakness (generalized)  Balance problem     Problem List Patient Active Problem List   Diagnosis Date Noted   Lumbar radiculopathy, right 02/27/2021   Posttraumatic headache 11/24/2020   Insomnia 06/21/2020   Major depression, single episode 06/21/2020   Migraine 06/21/2020   Posttraumatic stress disorder 06/21/2020   Anxiety 06/21/2020   Allergic rhinitis 06/21/2020   Hyperacusis of both ears 05/17/2020   Laryngopharyngeal reflux (LPR) 05/17/2020   Right rotator cuff tear 03/17/2020   Excessive physiologic tremor 02/11/2015   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/14/2014    Juel Burrow, PT, DPT 03/23/2021, 8:44 AM  [place] @ [place], [place], [address] Phone: [phone]   Fax:  [phone]  Name: Kristen [name] MRN: [id number] Date of Birth: 1960-12-09

## 2021-03-27 ENCOUNTER — Other Ambulatory Visit: Payer: Self-pay

## 2021-03-27 ENCOUNTER — Ambulatory Visit: Payer: Medicare Other | Admitting: Physical Therapy

## 2021-03-27 ENCOUNTER — Encounter: Payer: Self-pay | Admitting: Physical Therapy

## 2021-03-27 DIAGNOSIS — R2689 Other abnormalities of gait and mobility: Secondary | ICD-10-CM

## 2021-03-27 DIAGNOSIS — R2681 Unsteadiness on feet: Secondary | ICD-10-CM

## 2021-03-27 DIAGNOSIS — M542 Cervicalgia: Secondary | ICD-10-CM

## 2021-03-27 DIAGNOSIS — M6281 Muscle weakness (generalized): Secondary | ICD-10-CM

## 2021-03-27 NOTE — Therapy (Signed)
[facility] [place] @ [address] [place], [place], [address] Phone: [phone]   Fax:  [phone]  Physical Therapy Treatment  Patient Details  Name: Kristen [name] MRN: [id number] Date of Birth: [birth year] Referring Provider (PT): Hulan Saas DO   Encounter Date: 03/27/2021   PT End of Session - 03/27/21 0805     Visit Number 19    Date for PT Re-Evaluation 04/07/21    Authorization Type MCR    Progress Note Due on Visit 20    PT Start Time 0803    PT Stop Time 0842    PT Time Calculation (min) 39 min    Activity Tolerance Patient tolerated treatment well    Behavior During Therapy [facility] for tasks assessed/performed             Past Medical History:  Diagnosis Date   Anxiety    Depression    Hair loss    Insomnia    Migraine    Post-surgical hypothyroidism    ?cancer    Past Surgical History:  Procedure Laterality Date   EYE SURGERY     SHOULDER ARTHROSCOPY WITH SUBACROMIAL DECOMPRESSION Right 04/11/2020   Procedure: SHOULDER ARTHROSCOPY WITH SUBACROMIAL DECOMPRESSION;  Surgeon: Tania Ade, MD;  Location: [facility];  Service: Orthopedics;  Laterality: Right;    There were no vitals filed for this visit.   Subjective Assessment - 03/27/21 0811     Subjective A little neck and hip pain: 3/10 this AM    Pertinent History anxiety, depression, right RCR    Currently in Pain? Yes   Rt hip and neck   Pain Score 3     Pain Descriptors / Indicators Dull;Sore    Aggravating Factors  Not sure    Pain Relieving Factors Exercise, rets, heat, needling                               [facility] Adult PT Treatment/Exercise - 03/27/21 0001       Neck Exercises: Machines for Strengthening   UBE (Upper Arm Bike) L 1.8 4x 4 with PTA present    Cybex Row 15# 2x15: VC to relax UT    Lat Pull 30# 2x15    Other Machines for Strengthening Hip Matrix 30#: hip abduction, hip flexion, hip  extension 1x10 each    Other Machines for Strengthening Leg Press 40# 2x10, seat at 5   Pt tried 60# & 50# but could not move the weight stack at all.     Neck Exercises: Standing   Other Standing Exercises Backweards ressisted walking with 10# x10 reps   Sit to stand 10x noUE   Other Standing Exercises Forward & lateral step; 5x each going all the way up & down: up no UE, down 1 finger for safety      Knee/Hip Exercises: Supine   Bridges Strengthening;Both;2 sets;5 reps   VC for technique                      PT Short Term Goals - 03/08/21 0826       PT SHORT TERM GOAL #1   Title Ind with initial HEP    Status Achieved      PT SHORT TERM GOAL #2   Title Able to rise from a chair 1x without UE assist    Status Achieved      PT SHORT  TERM GOAL #3   Title Pt to have negative Dix-Hallpike to bilat sides.    Status Achieved   Negative B sides              PT Long Term Goals - 03/20/21 2876       PT LONG TERM GOAL #1   Title Patient able to move her neck without difficulty and pain.    Status Partially Met      PT LONG TERM GOAL #2   Title Pt able to perform floor to standing transfer independently to allow for fall recovery.    Status On-going      PT LONG TERM GOAL #3   Title Pt independent with advanced HEP    Status Partially Met                   Plan - 03/27/21 0806     Clinical Impression Statement Pt arrives with mild neck and hip pain. Pt able to incrase reps on most exercises, less cuing for posture today. No increased pain. Pt demonstrates Rt hip weakness in standing.    Personal Factors and Comorbidities Comorbidity 3+    Comorbidities anxiety, depression, dizziness    Examination-Activity Limitations Carry;Stand;Locomotion Level    Examination-Participation Restrictions Community Activity    Stability/Clinical Decision Making Evolving/Moderate complexity    Rehab Potential Good    PT Frequency 2x / week    PT Duration 4 weeks     PT Treatment/Interventions ADLs/Self Care Home Management;Aquatic Therapy;Cryotherapy;Electrical Stimulation;Moist Heat;Traction;Gait Scientist, forensic;Therapeutic activities;Therapeutic exercise;Balance training;Neuromuscular re-education;Manual techniques;Patient/family education;Dry needling;Joint Manipulations;Spinal Manipulations;Canalith Repostioning;Vestibular    PT Next Visit Plan progress strengthening, scapular stabilization and balance: Re-assessment 12/29    PT Home Exercise Plan [id number] for strengthening and Access Code: [email or web address] for vestibular    Consulted and Agree with Plan of Care Patient             [name]  Decreased range of motion, Pain, Dizziness, Decreased strength, Postural dysfunction, Impaired flexibility, Increased muscle spasms  Visit Diagnosis: Cervicalgia  Muscle weakness (generalized)  Balance problem  Unsteadiness on feet     Problem List Patient Active Problem List   Diagnosis Date Noted   Lumbar radiculopathy, right 02/27/2021   Posttraumatic headache 11/24/2020   Insomnia 06/21/2020   Major depression, single episode 06/21/2020   Migraine 06/21/2020   Posttraumatic stress disorder 06/21/2020   Anxiety 06/21/2020   Allergic rhinitis 06/21/2020   Hyperacusis of both ears 05/17/2020   Laryngopharyngeal reflux (LPR) 05/17/2020   Right rotator cuff tear 03/17/2020   Excessive physiologic tremor 02/11/2015   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/14/2014    Kebron Pulse, PTA 03/27/2021, 8:42 AM  [place] @ [place], [place], [address] Phone: [phone]   Fax:  [phone]  Name: Kristen [name] MRN: [id number] Date of Birth: [birth year]

## 2021-03-29 ENCOUNTER — Ambulatory Visit
Admission: RE | Admit: 2021-03-29 | Discharge: 2021-03-29 | Disposition: A | Discharge status: Home / Self Care | Payer: Medicare Other | Source: Ambulatory Visit | Attending: Family Medicine | Admitting: Family Medicine

## 2021-03-29 ENCOUNTER — Other Ambulatory Visit: Payer: Self-pay

## 2021-03-29 DIAGNOSIS — R102 Pelvic and perineal pain: Secondary | ICD-10-CM

## 2021-03-30 ENCOUNTER — Other Ambulatory Visit: Payer: Self-pay

## 2021-03-30 DIAGNOSIS — N838 Other noninflammatory disorders of ovary, fallopian tube and broad ligament: Secondary | ICD-10-CM

## 2021-03-30 NOTE — Progress Notes (Signed)
Referral placed.

## 2021-04-04 ENCOUNTER — Ambulatory Visit: Payer: Medicare Other | Admitting: Rehabilitative and Restorative Service Providers"

## 2021-04-05 ENCOUNTER — Ambulatory Visit: Payer: BC Managed Care – PPO | Admitting: Internal Medicine

## 2021-04-06 ENCOUNTER — Ambulatory Visit: Payer: Medicare Other | Admitting: Rehabilitative and Restorative Service Providers"

## 2021-04-06 ENCOUNTER — Encounter: Payer: Self-pay | Admitting: Rehabilitative and Restorative Service Providers"

## 2021-04-06 ENCOUNTER — Other Ambulatory Visit: Payer: Self-pay

## 2021-04-06 DIAGNOSIS — R2681 Unsteadiness on feet: Secondary | ICD-10-CM

## 2021-04-06 DIAGNOSIS — M542 Cervicalgia: Secondary | ICD-10-CM | POA: Diagnosis not present

## 2021-04-06 DIAGNOSIS — M6281 Muscle weakness (generalized): Secondary | ICD-10-CM

## 2021-04-06 DIAGNOSIS — R42 Dizziness and giddiness: Secondary | ICD-10-CM

## 2021-04-06 DIAGNOSIS — R2689 Other abnormalities of gait and mobility: Secondary | ICD-10-CM

## 2021-04-06 NOTE — Therapy (Signed)
[facility] @ [facility] [place], [place], [address] Phone: [phone]   Fax:  [phone]  Physical Therapy Treatment and Discharge Summary  Patient Details  Name: Kristen [name] MRN: [id number] Date of Birth: February 28, 1961 Referring Provider (PT): Hulan Saas DO   Encounter Date: 04/06/2021   PT End of Session - 04/06/21 0805     Visit Number 20    Date for PT Re-Evaluation 04/07/21    Authorization Type MCR    Progress Note Due on Visit 20    PT Start Time 0801    PT Stop Time 0840    PT Time Calculation (min) 39 min    Activity Tolerance Patient tolerated treatment well    Behavior During Therapy [facility] for tasks assessed/performed             Past Medical History:  Diagnosis Date   Anxiety    Depression    Hair loss    Insomnia    Migraine    Post-surgical hypothyroidism    ?cancer    Past Surgical History:  Procedure Laterality Date   EYE SURGERY     SHOULDER ARTHROSCOPY WITH SUBACROMIAL DECOMPRESSION Right 04/11/2020   Procedure: SHOULDER ARTHROSCOPY WITH SUBACROMIAL DECOMPRESSION;  Surgeon: Tania Ade, MD;  Location: [place];  Service: Orthopedics;  Laterality: Right;    There were no vitals filed for this visit.   Subjective Assessment - 04/06/21 0803     Subjective Pt reports a little bit of neck/shoulder and low back pain.    Patient Stated Goals more movement in her neck    Currently in Pain? Yes    Pain Score 5     Pain Location Neck    Pain Orientation Lower    Pain Descriptors / Indicators Discomfort    Pain Type Chronic pain    Pain Radiating Towards into B shoulders    Pain Onset More than a month ago                [facility] PT Assessment - 04/06/21 0001       Functional Gait  Assessment   Gait assessed  Yes    Gait Level Surface Walks 20 ft in less than 5.5 sec, no assistive devices, good speed, no evidence for imbalance, normal gait pattern,  deviates no more than 6 in outside of the 12 in walkway width.    Change in Gait Speed Able to smoothly change walking speed without loss of balance or gait deviation. Deviate no more than 6 in outside of the 12 in walkway width.    Gait with Horizontal Head Turns Performs head turns smoothly with no change in gait. Deviates no more than 6 in outside 12 in walkway width    Gait with Vertical Head Turns Performs head turns with no change in gait. Deviates no more than 6 in outside 12 in walkway width.    Gait and Pivot Turn Pivot turns safely within 3 sec and stops quickly with no loss of balance.    Step Over Obstacle Is able to step over 2 stacked shoe boxes taped together (9 in total height) without changing gait speed. No evidence of imbalance.    Gait with Narrow Base of Support Is able to ambulate for 10 steps heel to toe with no staggering.    Gait with Eyes Closed Walks 20 ft, no assistive devices, good speed, no evidence of imbalance, normal gait pattern, deviates no more than  6 in outside 12 in walkway width. Ambulates 20 ft in less than 7 sec.    Ambulating Backwards Walks 20 ft, no assistive devices, good speed, no evidence for imbalance, normal gait    Steps Alternating feet, no rail.    Total Score 30                           [facility] Adult PT Treatment/Exercise - 04/06/21 0001       Transfers   Floor to Transfer 7: Independent      Ambulation/Gait   Stairs Yes    Stairs Assistance 7: Independent    Stair Management Technique No rails;Alternating pattern    Number of Stairs 16      Neck Exercises: Machines for Strengthening   Nustep L5 x 5 min PT present to monitor.   379 steps   Cybex Row 15# 2x15: VC to relax UT    Lat Pull 30# 2x15      Neck Exercises: Seated   Other Seated Exercise Sit to/from stand x10 reps holding 5# kettlebell      Manual Therapy   Manual Therapy Soft tissue mobilization;Myofascial release    Manual therapy comments in prone     Soft tissue mobilization B traps and R rhomboids    Myofascial Release trigger point release to R rhomboids and B upper trap              Trigger Point Dry Needling - 04/06/21 0001     Consent Given? Yes    Education Handout Provided Previously provided    Muscles Treated Head and Neck Upper trapezius    Muscles Treated Upper Quadrant Rhomboids    Upper Trapezius Response Twitch reponse elicited;Palpable increased muscle length    Rhomboids Response Twitch response elicited;Palpable increased muscle length                     PT Short Term Goals - 03/08/21 0826       PT SHORT TERM GOAL #1   Title Ind with initial HEP    Status Achieved      PT SHORT TERM GOAL #2   Title Able to rise from a chair 1x without UE assist    Status Achieved      PT SHORT TERM GOAL #3   Title Pt to have negative Dix-Hallpike to bilat sides.    Status Achieved   Negative B sides              PT Long Term Goals - 04/06/21 0836       PT LONG TERM GOAL #1   Title Patient able to move her neck without difficulty and pain.    Status Achieved      PT LONG TERM GOAL #2   Title Pt able to perform floor to standing transfer independently to allow for fall recovery.    Status Achieved      PT LONG TERM GOAL #3   Title Pt independent with advanced HEP    Status Achieved      PT LONG TERM GOAL #4   Title Functional Gait Assessment improved to 26/30    Status Achieved      PT LONG TERM GOAL #5   Title Pt able to negotiate 12 steps with reciprocal pattern without a rail to allow her to perform stairs at her home and at friend's homes.    Status Achieved  Plan - 04/06/21 0842     Clinical Impression Statement Pt has met all goals and is ready for discharge from outpatient PT at this time.  Pt is able to perform floor to standing transfer without using external apparatus to pull herself up.  Pt is able to negotiate steps with reciprocal pattern and no  rail or loss of balance with good cadence. Pt requested one last dry needling session to assist with upper trap tightness from holidays.  Pt reported relief of pain following and states no pain, only some tightness sensation. Pt is independent with HEP and verbalizes understanding to continue HEP following discharge.    PT Treatment/Interventions ADLs/Self Care Home Management;Aquatic Therapy;Cryotherapy;Electrical Stimulation;Moist Heat;Traction;Gait Scientist, forensic;Therapeutic activities;Therapeutic exercise;Balance training;Neuromuscular re-education;Manual techniques;Patient/family education;Dry needling;Joint Manipulations;Spinal Manipulations;Canalith Repostioning;Vestibular    PT Next Visit Plan outpatient DC on 04/06/21    Consulted and Agree with Plan of Care Patient             [name]  Decreased range of motion, Pain, Dizziness, Decreased strength, Postural dysfunction, Impaired flexibility, Increased muscle spasms  Visit Diagnosis: Cervicalgia  Muscle weakness (generalized)  Balance problem  Unsteadiness on feet  Dizziness and giddiness     Problem List Patient Active Problem List   Diagnosis Date Noted   Lumbar radiculopathy, right 02/27/2021   Posttraumatic headache 11/24/2020   Insomnia 06/21/2020   Major depression, single episode 06/21/2020   Migraine 06/21/2020   Posttraumatic stress disorder 06/21/2020   Anxiety 06/21/2020   Allergic rhinitis 06/21/2020   Hyperacusis of both ears 05/17/2020   Laryngopharyngeal reflux (LPR) 05/17/2020   Right rotator cuff tear 03/17/2020   Excessive physiologic tremor 02/11/2015   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 Patient goals were met. Patient is being discharged due to meeting the stated rehab goals.   Juel Burrow, PT,  DPT 04/06/2021, 9:11 AM  [facility] @ [place], [place], [address] Phone: [phone]   Fax:  [phone]  Name: Kristen [name] MRN: [id number] Date of Birth: [birth year]

## 2021-04-09 HISTORY — PX: COLONOSCOPY WITH PROPOFOL: SHX5780

## 2021-04-11 ENCOUNTER — Other Ambulatory Visit (INDEPENDENT_AMBULATORY_CARE_PROVIDER_SITE_OTHER): Payer: Medicare Other

## 2021-04-11 ENCOUNTER — Encounter: Payer: Self-pay | Admitting: Internal Medicine

## 2021-04-11 ENCOUNTER — Other Ambulatory Visit: Payer: Self-pay

## 2021-04-11 ENCOUNTER — Encounter: Payer: Medicare Other | Admitting: Rehabilitative and Restorative Service Providers"

## 2021-04-11 DIAGNOSIS — E890 Postprocedural hypothyroidism: Secondary | ICD-10-CM | POA: Diagnosis not present

## 2021-04-11 LAB — TSH: TSH: 0.67 u[IU]/mL (ref 0.35–5.50)

## 2021-04-11 LAB — T4, FREE: Free T4: 1.07 ng/dL (ref 0.60–1.60)

## 2021-04-12 ENCOUNTER — Encounter: Payer: Self-pay | Admitting: Internal Medicine

## 2021-04-13 ENCOUNTER — Encounter: Payer: Medicare Other | Admitting: Rehabilitative and Restorative Service Providers"

## 2021-04-14 ENCOUNTER — Other Ambulatory Visit: Payer: Medicare Other

## 2021-05-19 ENCOUNTER — Ambulatory Visit: Payer: Medicare Other | Admitting: Family Medicine

## 2021-05-29 ENCOUNTER — Encounter: Payer: Self-pay | Admitting: Family Medicine

## 2021-05-29 DIAGNOSIS — M542 Cervicalgia: Secondary | ICD-10-CM

## 2021-05-29 DIAGNOSIS — G8929 Other chronic pain: Secondary | ICD-10-CM

## 2021-05-29 DIAGNOSIS — M25511 Pain in right shoulder: Secondary | ICD-10-CM

## 2021-06-15 ENCOUNTER — Other Ambulatory Visit: Payer: Self-pay | Admitting: Obstetrics and Gynecology

## 2021-06-26 NOTE — Therapy (Incomplete)
?OUTPATIENT PHYSICAL THERAPY CERVICAL EVALUATION ? ? ?Patient Name: Kristen [name] ?MRN: [id number] ?DOB:11/02/1960, 61 y.o., adult ?Today's Date: 06/26/2021 ? ? ? ?Past Medical History:  ?Diagnosis Date  ? Anxiety   ? Depression   ? Hair loss   ? Insomnia   ? Migraine   ? Post-surgical hypothyroidism   ? ?cancer  ? ?Past Surgical History:  ?Procedure Laterality Date  ? EYE SURGERY    ? SHOULDER ARTHROSCOPY WITH SUBACROMIAL DECOMPRESSION Right 04/11/2020  ? Procedure: SHOULDER ARTHROSCOPY WITH SUBACROMIAL DECOMPRESSION;  Surgeon: Tania Ade, MD;  Location: [facility];  Service: Orthopedics;  Laterality: Right;  ? ?Patient Active Problem List  ? Diagnosis Date Noted  ? Lumbar radiculopathy, right 02/27/2021  ? Posttraumatic headache 11/24/2020  ? Insomnia 06/21/2020  ? Major depression, single episode 06/21/2020  ? Migraine 06/21/2020  ? Posttraumatic stress disorder 06/21/2020  ? Anxiety 06/21/2020  ? Allergic rhinitis 06/21/2020  ? Hyperacusis of both ears 05/17/2020  ? Laryngopharyngeal reflux (LPR) 05/17/2020  ? Right rotator cuff tear 03/17/2020  ? Excessive physiologic tremor 02/11/2015  ? 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 ? ?PCP: Lazoff, Shawn P, DO ? ?REFERRING PROVIDER: Lyndal Pulley, DO  ? ?REFERRING DIAG: M54.2 (ICD-10-CM) - Neck pain M25.511,G89.29 (ICD-10-CM) - Chronic right shoulder pain  ? ?THERAPY DIAG:  ?No diagnosis found. ? ?ONSET DATE: 06/12/2021  ? ?SUBJECTIVE:                                                                                                                                                                                                        ? ?SUBJECTIVE STATEMENT: ?*** ? ?PERTINENT HISTORY:  ?Anxiety/Depression, Migraine, R shoulder arthroscopy with subacromial decompression on 04/11/20, [facility] ? ?PAIN:  ?Are you having pain? {OPRCPAIN:27236} ? ?PRECAUTIONS: None ? ?WEIGHT BEARING RESTRICTIONS No ? ?FALLS:  ?Has patient fallen in last 6 months?  {yes/no:20286} ?Number of falls: *** ? ?LIVING ENVIRONMENT: ?Lives with: lives with their spouse ?Lives in: House/apartment ?Stairs: {yes/no:20286}; {Stairs:24000} ?Has following equipment at home: {Assistive devices:23999} ? ?OCCUPATION: *** ? ?PLOF: Independent and Leisure: *** ? ?PATIENT GOALS *** ? ?OBJECTIVE:  ? ?DIAGNOSTIC FINDINGS:  ?03/15/2021 MRI:  L4-5: Mild disc degeneration. Shallow central disc protrusion ?unchanged. Mild facet degeneration. No significant stenosis. ?  ?L5-S1: Mild disc bulging.  Negative for stenosis. ? ?PATIENT SURVEYS:  ?FOTO *** ? ? ?COGNITION: ?Overall cognitive status: Within functional limits for tasks assessed ? ? ?SENSATION: ?{sensation:27233} ? ?POSTURE:  ?*** ? ?PALPATION: ?***  ? ?CERVICAL ROM:  ? ?Active ROM A/PROM (deg) ?06/26/2021  ?Flexion   ?  Extension   ?Right lateral flexion   ?Left lateral flexion   ?Right rotation   ?Left rotation   ? (Blank rows = not tested) ? ?UE ROM: ? ?Active ROM Right ?06/26/2021 Left ?06/26/2021  ?Shoulder flexion    ?Shoulder extension    ?Shoulder abduction    ?Shoulder adduction    ?Shoulder extension    ?Shoulder internal rotation    ?Shoulder external rotation    ?Elbow flexion    ?Elbow extension    ?Wrist flexion    ?Wrist extension    ?Wrist ulnar deviation    ?Wrist radial deviation    ?Wrist pronation    ?Wrist supination    ? (Blank rows = not tested) ? ?UE MMT: ? ?MMT Right ?06/26/2021 Left ?06/26/2021  ?Shoulder flexion    ?Shoulder extension    ?Shoulder abduction    ?Shoulder adduction    ?Shoulder extension    ?Shoulder internal rotation    ?Shoulder external rotation    ?Middle trapezius    ?Lower trapezius    ?Elbow flexion    ?Elbow extension    ?Wrist flexion    ?Wrist extension    ?Wrist ulnar deviation    ?Wrist radial deviation    ?Wrist pronation    ?Wrist supination    ?Grip strength    ? (Blank rows = not tested) ? ?CERVICAL SPECIAL TESTS:  ?{Cervical special tests:25246} ? ? ?FUNCTIONAL TESTS:  ?{Functional  tests:24029} ? ? ?TODAY'S TREATMENT:  ?*** ? ? ?PATIENT EDUCATION:  ?Education details: *** ?Person educated: {Person educated:25204} ?Education method: {Education Method:25205} ?Education comprehension: {Education Comprehension:25206} ? ? ?HOME EXERCISE PROGRAM: ?*** ? ?ASSESSMENT: ? ?CLINICAL IMPRESSION: ?Patient is a 61 y.o. female who was seen today for physical therapy evaluation and treatment for neck pain and chronic right shoulder pain.  ? ? ?OBJECTIVE IMPAIRMENTS {opptimpairments:25111}.  ? ?ACTIVITY LIMITATIONS {activity limitations:25113}.  ? ?PERSONAL FACTORS {Personal factors:25162} are also affecting patient's functional outcome.  ? ? ?REHAB POTENTIAL: {rehabpotential:25112} ? ?CLINICAL DECISION MAKING: {clinical decision making:25114} ? ?EVALUATION COMPLEXITY: {Evaluation complexity:25115} ? ? ?GOALS: ?Goals reviewed with patient? Yes ? ?SHORT TERM GOALS: Target date: {follow up:25551} ? ?Pt will be independent with initial HEP. ?Baseline:  ?Goal status: INITIAL ? ?2.  Pt will report at least a 40% improvement in symptoms. ?Baseline:  ?Goal status: INITIAL ? ? ?LONG TERM GOALS: Target date: {follow up:25551} ? ?Pt will be independent with advanced HEP. ?Baseline:  ?Goal status: INITIAL ? ?2.  Pt will increase FOTO to *** to demeonstrate improvements in functional tasks. ?Baseline: *** ?Goal status: INITIAL ? ?3.  *** ?Baseline: *** ?Goal status: {GOALSTATUS:25110} ? ?4.  *** ?Baseline: *** ?Goal status: {GOALSTATUS:25110} ? ?5.  *** ?Baseline: *** ?Goal status: {GOALSTATUS:25110} ? ?6.  *** ?Baseline: *** ?Goal status: {GOALSTATUS:25110} ? ? ?PLAN: ?PT FREQUENCY: {rehab frequency:25116} ? ?PT DURATION: {rehab duration:25117} ? ?PLANNED INTERVENTIONS: Therapeutic exercises, Therapeutic activity, Neuromuscular re-education, Balance training, Gait training, Patient/Family education, Joint manipulation, Joint mobilization, Stair training, Vestibular training, Canalith repositioning, Aquatic Therapy, Dry  Needling, Electrical stimulation, Spinal manipulation, Spinal mobilization, Cryotherapy, Moist heat, Taping, Vasopneumatic device, Traction, Ultrasound, Ionotophoresis '4mg'$ /ml Dexamethasone, and Manual therapy ? ?PLAN FOR NEXT SESSION: assess and progress HEP as indicated, manual therapy and dry needling as indicated, strengthening, flexibility  ? ? ?Juel Burrow, PT ?06/26/2021, 8:33 AM ? ?[facility] ?[address], [address] ?[place], [place] [address] ?Phone # [phone] ?Fax [phone] ? ? ?  ?

## 2021-06-27 ENCOUNTER — Ambulatory Visit: Payer: Medicare Other | Admitting: Rehabilitative and Restorative Service Providers"

## 2021-06-29 ENCOUNTER — Encounter (HOSPITAL_BASED_OUTPATIENT_CLINIC_OR_DEPARTMENT_OTHER): Payer: Self-pay | Admitting: Obstetrics and Gynecology

## 2021-06-29 ENCOUNTER — Other Ambulatory Visit: Payer: Self-pay

## 2021-06-29 NOTE — Progress Notes (Signed)
Spoke w/ via phone for pre-op interview--- pt ?Lab needs dos----   no            ?Lab results------ pt getting  lab work and ekg done 06-30-2021  CBC/ BMP/ T&S ?COVID test -----patient states asymptomatic no test needed ?Arrive at ------- 1130 on 07-03-2021 ?NPO after MN NO Solid Food.  Clear liquids from MN until--- 1030 ?Med rec completed ?Medications to take morning of surgery ----- zoloft, lopressor, synthroid ?Diabetic medication ----- n/a ?Patient instructed no nail polish to be worn day of surgery ?Patient instructed to bring photo id and insurance card day of surgery ?Patient aware to have Driver (ride ) / caregiver for 24 hours after surgery -- husband, Lanny Hurst ?Patient Special Instructions ----- n/a ?Pre-Op special Istructions ----- n/a ?Patient verbalized understanding of instructions that were given at this phone interview. ?Patient denies shortness of breath, chest pain, fever, cough at this phone interview.  ?

## 2021-06-30 ENCOUNTER — Encounter (HOSPITAL_COMMUNITY)
Admission: RE | Admit: 2021-06-30 | Discharge: 2021-06-30 | Disposition: A | Discharge status: Home / Self Care | Payer: Medicare Other | Source: Ambulatory Visit | Attending: Obstetrics and Gynecology | Admitting: Obstetrics and Gynecology

## 2021-06-30 DIAGNOSIS — Z01818 Encounter for other preprocedural examination: Secondary | ICD-10-CM | POA: Diagnosis not present

## 2021-06-30 LAB — BASIC METABOLIC PANEL
Anion gap: 5 (ref 5–15)
BUN: 21 mg/dL — ABNORMAL HIGH (ref 6–20)
CO2: 23 mmol/L (ref 22–32)
Calcium: 8.7 mg/dL — ABNORMAL LOW (ref 8.9–10.3)
Chloride: 112 mmol/L — ABNORMAL HIGH (ref 98–111)
Creatinine, Ser: 0.53 mg/dL (ref 0.44–1.00)
GFR, Estimated: >60 mL/min (ref >60)
Glucose, Bld: 94 mg/dL (ref 70–99)
Potassium: 3.7 mmol/L (ref 3.5–5.1)
Sodium: 140 mmol/L (ref 135–145)

## 2021-06-30 LAB — CBC
HCT: 38.1 % (ref 36.0–46.0)
Hemoglobin: 12.4 g/dL (ref 12.0–15.0)
MCH: 31.3 pg (ref 26.0–34.0)
MCHC: 32.5 g/dL (ref 30.0–36.0)
MCV: 96.2 fL (ref 80.0–100.0)
Platelets: 313 10*3/uL (ref 150–400)
RBC: 3.96 MIL/uL (ref 3.87–5.11)
RDW: 12.8 % (ref 11.5–15.5)
WBC: 8.5 10*3/uL (ref 4.0–10.5)
nRBC: 0 % (ref 0.0–0.2)

## 2021-07-03 ENCOUNTER — Ambulatory Visit (HOSPITAL_BASED_OUTPATIENT_CLINIC_OR_DEPARTMENT_OTHER): Payer: Medicare Other | Admitting: Certified Registered Nurse Anesthetist

## 2021-07-03 ENCOUNTER — Ambulatory Visit (HOSPITAL_BASED_OUTPATIENT_CLINIC_OR_DEPARTMENT_OTHER)
Admission: RE | Admit: 2021-07-03 | Discharge: 2021-07-03 | Disposition: A | Discharge status: Home / Self Care | Payer: Medicare Other | Source: Ambulatory Visit | Attending: Obstetrics and Gynecology | Admitting: Obstetrics and Gynecology

## 2021-07-03 ENCOUNTER — Other Ambulatory Visit: Payer: Self-pay

## 2021-07-03 ENCOUNTER — Encounter (HOSPITAL_BASED_OUTPATIENT_CLINIC_OR_DEPARTMENT_OTHER): Payer: Self-pay | Admitting: Obstetrics and Gynecology

## 2021-07-03 ENCOUNTER — Encounter (HOSPITAL_BASED_OUTPATIENT_CLINIC_OR_DEPARTMENT_OTHER)
Admission: RE | Disposition: A | Discharge status: Home / Self Care | Payer: Self-pay | Source: Ambulatory Visit | Attending: Obstetrics and Gynecology

## 2021-07-03 DIAGNOSIS — Z79890 Hormone replacement therapy (postmenopausal): Secondary | ICD-10-CM | POA: Diagnosis not present

## 2021-07-03 DIAGNOSIS — Z79899 Other long term (current) drug therapy: Secondary | ICD-10-CM | POA: Insufficient documentation

## 2021-07-03 DIAGNOSIS — E039 Hypothyroidism, unspecified: Secondary | ICD-10-CM | POA: Insufficient documentation

## 2021-07-03 DIAGNOSIS — N83201 Unspecified ovarian cyst, right side: Secondary | ICD-10-CM | POA: Diagnosis not present

## 2021-07-03 DIAGNOSIS — F411 Generalized anxiety disorder: Secondary | ICD-10-CM | POA: Diagnosis not present

## 2021-07-03 DIAGNOSIS — G4700 Insomnia, unspecified: Secondary | ICD-10-CM | POA: Insufficient documentation

## 2021-07-03 DIAGNOSIS — G709 Myoneural disorder, unspecified: Secondary | ICD-10-CM

## 2021-07-03 DIAGNOSIS — I10 Essential (primary) hypertension: Secondary | ICD-10-CM | POA: Insufficient documentation

## 2021-07-03 DIAGNOSIS — D270 Benign neoplasm of right ovary: Secondary | ICD-10-CM | POA: Insufficient documentation

## 2021-07-03 HISTORY — DX: Vitamin B12 deficiency anemia, unspecified: D51.9

## 2021-07-03 HISTORY — DX: Unspecified ovarian cyst, right side: N83.201

## 2021-07-03 HISTORY — DX: Essential (primary) hypertension: I10

## 2021-07-03 HISTORY — DX: Benign paroxysmal vertigo, unspecified ear: H81.10

## 2021-07-03 HISTORY — PX: LAPAROSCOPIC BILATERAL SALPINGO OOPHERECTOMY: SHX5890

## 2021-07-03 HISTORY — DX: Polyneuropathy, unspecified: G62.9

## 2021-07-03 HISTORY — DX: Generalized anxiety disorder: F41.1

## 2021-07-03 LAB — TYPE AND SCREEN
ABO/RH(D): A POS
Antibody Screen: NEGATIVE

## 2021-07-03 LAB — ABO/RH: ABO/RH(D): A POS

## 2021-07-03 SURGERY — SALPINGO-OOPHORECTOMY, BILATERAL, LAPAROSCOPIC
Anesthesia: General | Site: Abdomen | Laterality: Bilateral

## 2021-07-03 MED ORDER — FENTANYL CITRATE (PF) 100 MCG/2ML IJ SOLN
INTRAMUSCULAR | Status: AC
  Filled 2021-07-03: qty 2

## 2021-07-03 MED ORDER — ROCURONIUM BROMIDE 10 MG/ML (PF) SYRINGE
PREFILLED_SYRINGE | INTRAVENOUS | Status: DC | PRN
  Administered 2021-07-03: 50 mg via INTRAVENOUS

## 2021-07-03 MED ORDER — DEXAMETHASONE SODIUM PHOSPHATE 10 MG/ML IJ SOLN
INTRAMUSCULAR | Status: AC
  Filled 2021-07-03: qty 1

## 2021-07-03 MED ORDER — FENTANYL CITRATE (PF) 250 MCG/5ML IJ SOLN
INTRAMUSCULAR | Status: DC | PRN
  Administered 2021-07-03 (×2): 25 ug via INTRAVENOUS
  Administered 2021-07-03: 50 ug via INTRAVENOUS

## 2021-07-03 MED ORDER — PHENYLEPHRINE 40 MCG/ML (10ML) SYRINGE FOR IV PUSH (FOR BLOOD PRESSURE SUPPORT)
PREFILLED_SYRINGE | INTRAVENOUS | Status: AC
Start: 2021-07-03 — End: ?
  Filled 2021-07-03: qty 10

## 2021-07-03 MED ORDER — ONDANSETRON HCL 4 MG/2ML IJ SOLN
INTRAMUSCULAR | Status: DC | PRN
  Administered 2021-07-03: 4 mg via INTRAVENOUS

## 2021-07-03 MED ORDER — LIDOCAINE HCL (PF) 2 % IJ SOLN
INTRAMUSCULAR | Status: AC
  Filled 2021-07-03: qty 5

## 2021-07-03 MED ORDER — PROPOFOL 10 MG/ML IV BOLUS
INTRAVENOUS | Status: DC | PRN
  Administered 2021-07-03: 150 mg via INTRAVENOUS

## 2021-07-03 MED ORDER — HYDROMORPHONE HCL 1 MG/ML IJ SOLN
0.2500 mg | INTRAMUSCULAR | Status: DC | PRN
  Administered 2021-07-03: 0.25 mg via INTRAVENOUS

## 2021-07-03 MED ORDER — 0.9 % SODIUM CHLORIDE (POUR BTL) OPTIME
TOPICAL | Status: DC | PRN
  Administered 2021-07-03: 500 mL

## 2021-07-03 MED ORDER — IBUPROFEN 800 MG PO TABS: 800.0000 mg | ORAL_TABLET | Freq: Three times a day (TID) | ORAL | 0 refills | Status: DC | PRN

## 2021-07-03 MED ORDER — LACTATED RINGERS IV SOLN: INTRAVENOUS | Status: DC

## 2021-07-03 MED ORDER — OXYCODONE HCL 5 MG PO TABS: 5.0000 mg | ORAL_TABLET | Freq: Four times a day (QID) | ORAL | 0 refills | Status: AC | PRN

## 2021-07-03 MED ORDER — PHENYLEPHRINE 40 MCG/ML (10ML) SYRINGE FOR IV PUSH (FOR BLOOD PRESSURE SUPPORT)
PREFILLED_SYRINGE | INTRAVENOUS | Status: DC | PRN
Start: 2021-07-03 — End: 2021-07-03
  Administered 2021-07-03: 80 ug via INTRAVENOUS

## 2021-07-03 MED ORDER — LIDOCAINE 2% (20 MG/ML) 5 ML SYRINGE
INTRAMUSCULAR | Status: DC | PRN
  Administered 2021-07-03: 50 mg via INTRAVENOUS

## 2021-07-03 MED ORDER — SUGAMMADEX SODIUM 200 MG/2ML IV SOLN
INTRAVENOUS | Status: DC | PRN
  Administered 2021-07-03: 150 mg via INTRAVENOUS

## 2021-07-03 MED ORDER — OXYCODONE HCL 5 MG PO TABS: 5.0000 mg | ORAL_TABLET | Freq: Four times a day (QID) | ORAL | 0 refills | Status: DC | PRN

## 2021-07-03 MED ORDER — KETOROLAC TROMETHAMINE 30 MG/ML IJ SOLN
INTRAMUSCULAR | Status: DC | PRN
  Administered 2021-07-03: 30 mg via INTRAVENOUS

## 2021-07-03 MED ORDER — POVIDONE-IODINE 10 % EX SWAB: 2.0000 "application " | Freq: Once | CUTANEOUS | Status: DC

## 2021-07-03 MED ORDER — ROCURONIUM BROMIDE 10 MG/ML (PF) SYRINGE
PREFILLED_SYRINGE | INTRAVENOUS | Status: AC
  Filled 2021-07-03: qty 10

## 2021-07-03 MED ORDER — ONDANSETRON HCL 4 MG/2ML IJ SOLN
INTRAMUSCULAR | Status: AC
  Filled 2021-07-03: qty 2

## 2021-07-03 MED ORDER — BUPIVACAINE HCL (PF) 0.25 % IJ SOLN
INTRAMUSCULAR | Status: DC | PRN
  Administered 2021-07-03: 30 mL

## 2021-07-03 MED ORDER — SODIUM CHLORIDE 0.9 % IR SOLN
Status: DC | PRN
  Administered 2021-07-03: 500 mL

## 2021-07-03 MED ORDER — MIDAZOLAM HCL 2 MG/2ML IJ SOLN
INTRAMUSCULAR | Status: AC
Start: 2021-07-03 — End: ?
  Filled 2021-07-03: qty 2

## 2021-07-03 MED ORDER — HYDROMORPHONE HCL 1 MG/ML IJ SOLN
INTRAMUSCULAR | Status: AC
  Filled 2021-07-03: qty 1

## 2021-07-03 MED ORDER — KETOROLAC TROMETHAMINE 30 MG/ML IJ SOLN
INTRAMUSCULAR | Status: AC
  Filled 2021-07-03: qty 5

## 2021-07-03 MED ORDER — PROPOFOL 10 MG/ML IV BOLUS
INTRAVENOUS | Status: AC
  Filled 2021-07-03: qty 20

## 2021-07-03 MED ORDER — IBUPROFEN 800 MG PO TABS: 800.0000 mg | ORAL_TABLET | Freq: Three times a day (TID) | ORAL | 0 refills | Status: AC | PRN

## 2021-07-03 MED ORDER — SILVER NITRATE-POT NITRATE 75-25 % EX MISC
CUTANEOUS | Status: DC | PRN
  Administered 2021-07-03: 4

## 2021-07-03 MED ORDER — DEXAMETHASONE SODIUM PHOSPHATE 10 MG/ML IJ SOLN
INTRAMUSCULAR | Status: DC | PRN
  Administered 2021-07-03: 10 mg via INTRAVENOUS

## 2021-07-03 MED ORDER — MIDAZOLAM HCL 2 MG/2ML IJ SOLN
INTRAMUSCULAR | Status: DC | PRN
  Administered 2021-07-03: 2 mg via INTRAVENOUS

## 2021-07-03 SURGICAL SUPPLY — 42 items
ADH SKN CLS APL DERMABOND .7 (GAUZE/BANDAGES/DRESSINGS) ×1
BAG RETRIEVAL 10 (BASKET)
CABLE HIGH FREQUENCY MONO STRZ (ELECTRODE) IMPLANT
DERMABOND ADVANCED (GAUZE/BANDAGES/DRESSINGS) ×1
DERMABOND ADVANCED .7 DNX12 (GAUZE/BANDAGES/DRESSINGS) ×1 IMPLANT
DILATOR CANAL MILEX (MISCELLANEOUS) ×1 IMPLANT
DRSG OPSITE POSTOP 3X4 (GAUZE/BANDAGES/DRESSINGS) ×2 IMPLANT
DURAPREP 26ML APPLICATOR (WOUND CARE) ×2 IMPLANT
GAUZE 4X4 16PLY ~~LOC~~+RFID DBL (SPONGE) ×3 IMPLANT
GLOVE SURG LTX SZ6.5 (GLOVE) ×2 IMPLANT
GLOVE SURG UNDER POLY LF SZ7 (GLOVE) ×8 IMPLANT
GOWN STRL REUS W/TWL LRG LVL3 (GOWN DISPOSABLE) ×8 IMPLANT
IV NS 1000ML (IV SOLUTION) ×2
IV NS 1000ML BAXH (IV SOLUTION) IMPLANT
KIT TURNOVER CYSTO (KITS) ×2 IMPLANT
LIGASURE VESSEL 5MM BLUNT TIP (ELECTROSURGICAL) IMPLANT
MANIPULATOR UTERINE 4.5 ZUMI (MISCELLANEOUS) ×1 IMPLANT
NEEDLE INSUFFLATION 120MM (ENDOMECHANICALS) ×2 IMPLANT
NS IRRIG 500ML POUR BTL (IV SOLUTION) ×1 IMPLANT
PACK LAPAROSCOPY BASIN (CUSTOM PROCEDURE TRAY) ×2 IMPLANT
PACK TRENDGUARD 450 HYBRID PRO (MISCELLANEOUS) IMPLANT
PROTECTOR NERVE ULNAR (MISCELLANEOUS) ×1 IMPLANT
SCISSORS LAP 5X35 DISP (ENDOMECHANICALS) IMPLANT
SET IRRIG TUBING LAPAROSCOPIC (IRRIGATION / IRRIGATOR) ×1 IMPLANT
SET TUBE SMOKE EVAC HIGH FLOW (TUBING) ×2 IMPLANT
SHEARS HARMONIC ACE PLUS 36CM (ENDOMECHANICALS) ×1 IMPLANT
SUT VICRYL 0 UR6 27IN ABS (SUTURE) ×2 IMPLANT
SUT VICRYL 4-0 PS2 18IN ABS (SUTURE) ×2 IMPLANT
SYR 50ML LL SCALE MARK (SYRINGE) ×1 IMPLANT
SYR 5ML LL (SYRINGE) ×2 IMPLANT
SYS BAG RETRIEVAL 10MM (BASKET)
SYS RETRIEVAL 5MM INZII UNIV (BASKET) ×2
SYSTEM BAG RETRIEVAL 10MM (BASKET) IMPLANT
SYSTEM RETRIEVL 5MM INZII UNIV (BASKET) IMPLANT
TOWEL OR 17X26 10 PK STRL BLUE (TOWEL DISPOSABLE) ×3 IMPLANT
TRAP SPECIMEN MUCUS 40CC (MISCELLANEOUS) ×1 IMPLANT
TRAY FOLEY W/BAG SLVR 14FR LF (SET/KITS/TRAYS/PACK) ×2 IMPLANT
TRENDGUARD 450 HYBRID PRO PACK (MISCELLANEOUS) ×2
TROCAR KII 8X100ML NONTHREADED (TROCAR) ×1 IMPLANT
TROCAR XCEL NON-BLD 11X100MML (ENDOMECHANICALS) IMPLANT
TROCAR XCEL NON-BLD 5MMX100MML (ENDOMECHANICALS) ×5 IMPLANT
WARMER LAPAROSCOPE (MISCELLANEOUS) ×2 IMPLANT

## 2021-07-03 NOTE — Anesthesia Postprocedure Evaluation (Signed)
Anesthesia Post Note ? ?Patient: Kristen [name] ? ?Procedure(s) Performed: LAPAROSCOPIC BILATERAL SALPINGO OOPHORECTOMY; PELVIC WASHINGS (Bilateral: Abdomen) ? ?  ? ?Patient location during evaluation: PACU ?Anesthesia Type: General ?Level of consciousness: awake and alert, oriented and patient cooperative ?Pain management: pain level controlled ?Vital Signs Assessment: post-procedure vital signs reviewed and stable ?Respiratory status: spontaneous breathing, nonlabored ventilation and respiratory function stable ?Cardiovascular status: blood pressure returned to baseline and stable ?Postop Assessment: no apparent nausea or vomiting ?Anesthetic complications: no ? ? ?No notable events documented. ? ?Last Vitals:  ?Vitals:  ? 07/03/21 1200 07/03/21 1515  ?BP: (!) 141/85 115/70  ?Pulse: 62 63  ?Resp: 20 15  ?Temp: 36.8 ?C 36.5 ?C  ?SpO2: 94% (!) 88%  ?  ?Last Pain:  ?Vitals:  ? 07/03/21 1515  ?TempSrc:   ?PainSc: 0-No pain  ? ? ?  ?  ?  ?  ?  ?  ? ?Adden Strout,E. Nastassia Bazaldua ? ? ? ? ?

## 2021-07-03 NOTE — Op Note (Signed)
Kristen [name] ?[birth year] ?[id number] ? ?Operative Note ? ?PROCEDURE: laparoscopic bilateral salpingo-oophorectomy ? ?PRE-OPERATIVE DIAGNOSIS: right ovarian cyst ? ?POST-OPERATIVE DIAGNOSIS: right ovarian cyst ? ?SURGEON: Dr. Langley Gauss, DO ? ?ASSISTANT: Dr. Aloha Gell, MD ? ?FINDINGS: pelvic exam reveals atrophic vagina with small stenotic cervical canal. Laparoscopic general survey grossly normal including upper quadrants and liver edge. Female pelvic anatomy reveals small mobile uterus with smooth contours, bilateral fallopian tubes with erythematous fimbriated ends on the right, enlarged ~ 6 cm right ovarian cyst drained for clear fluid within the bag, normal appearing left ovary  ? ?SPECIMENS: left fallopian tube and ovary and right fallopian tube and ovary with cyst, right fallopian tube, pelvic washings ? ?EBL: minimal ? ?FLUIDS: per anesthesia  ? ?COMPLICATIONS: None ? ?PROCEDURE IN DETAIL:  ? ?After the patient was appropriately consented in the holding area, she was taken to the operating room where general anesthesia was administered without complications. The patient was placed in the dorsal lithotomy position. Bilateral arms were tucked with the appropriate barrier padding. The patient was prepped and draped in the usual sterile fashion. An appropriate time out was performed that verified the correct patient, procedure, and surgical team.  ? ?Pelvic exam performed. The Foley catheter was placed in the bladder which was drained for clear yellow urine and remained in place for the duration of the procedure. A sterile speculum was inserted into the vagina and the cervix was visualized. A single tooth tenaculum was used to grasp the anterior lip of the cervix. Attempts made to place a Zumi uterine manipulator however significant cervical stenosis noted. Use of pediatric dilators and cervical os finder used, was able to accommodate a Humi.  Attention was then turned to the abdomen. ? ?A scalpel was  used to make a small horizontal skin incision just inferior to the umbilicus. The Veress needle was advanced through the skin incision until 2 clicks were appreciated. The gas was connected and turned on and a low opening pressure was noted. Pneumoperitoneum was achieved without complications. A 5 mm laparoscope and trocar sleeve was inserted through the umbilical incision and the abdomen was entered under direct visualization. Inspection of the abdomen revealed the findings as noted above and no obvious injuries noted. The patient was placed in Trendelenburg positioning to facilitate pelvic visualization. Lower quadrant ports were placed bilaterally at points approximately 2 cm superior and 2 cm medial to the ASIS. Two 5 mm trocars were placed under laparoscopic visualization. Pelvic washings were collected and sent for cytology. The right ureter was visualized with peristalsis across the pelvic brim. The Harmonic device was used to secure the right IP ligament. The Harmonic was used to sequentially cauterize and cut the mesosalpinx and the fallopian tube and utero-ovarian ligament were transected medially. Attention was turned to the left and dissection performed in a similar fashion. The umbilical trocar was changed to an 8 mm to accommodate the 5 mm laparoscopic specimen retrieval bag. The right fallopian tube and ovary with cyst was placed within the bag under laparoscopic visualization.The specimen bag was brought up through the skin incision and the cyst was ruptured within the bag and drained for clear fluid. The remaining specimens were removed and sent for final pathology. A second laparoscopic specimen retrieval bag was used to remove the left fallopian tube and ovary in a similar fashion. All specimens were sent for final pathology. A final inspection of the surgical site and ovarian pedicles revealed excellent hemostasis. The pressure was reduced and the patient laid  flat and continued hemostasis noted.  The gas was released from the abdomen. The skin incisions were closed with 4-0 Vicryl and skin glue. Local anesthetic using 0.25% Marcaine had been injected at the incision sites at the start of the case. The uterine manipulator was removed, cervix and vagina inspected and found to be hemostatic. The Foley catheter was removed. The patient tolerated the procedure well and was taken to the recovery room in stable condition. All lap, needle, and instrument counts were correct. ? ?[name] A [name] ?07/03/21 ?3:32 PM ? ? ?

## 2021-07-03 NOTE — Transfer of Care (Signed)
Immediate Anesthesia Transfer of Care Note ? ?Patient: Kristen [name] ? ?Procedure(s) Performed: LAPAROSCOPIC BILATERAL SALPINGO OOPHORECTOMY; PELVIC WASHINGS (Bilateral: Abdomen) ? ?Patient Location: PACU ? ?Anesthesia Type:General ? ?Level of Consciousness: awake, alert  and oriented ? ?Airway & Oxygen Therapy: Patient Spontanous Breathing ? ?Post-op Assessment: Report given to RN and Post -op Vital signs reviewed and stable ? ?Post vital signs: Reviewed and stable ? ?Last Vitals:  ?Vitals Value Taken Time  ?BP 115/70 07/03/21 1515  ?Temp    ?Pulse 63 07/03/21 1516  ?Resp 11 07/03/21 1517  ?SpO2 88 % 07/03/21 1516  ?Vitals shown include unvalidated device data. ? ?Last Pain:  ?Vitals:  ? 07/03/21 1200  ?TempSrc: Oral  ?PainSc: 0-No pain  ?   ? ?Patients Stated Pain Goal: 4 (07/03/21 1200) ? ?Complications: No notable events documented. ?

## 2021-07-03 NOTE — Anesthesia Preprocedure Evaluation (Addendum)
Anesthesia Evaluation  ?Patient identified by MRN, date of birth, ID band ?Patient awake ? ? ? ?Reviewed: ?Allergy & Precautions, NPO status , Patient's Chart, lab work & pertinent test results ? ?Airway ?Mallampati: II ? ?TM Distance: >3 FB ? ? ? ? Dental ?  ?Pulmonary ?neg pulmonary ROS,  ?  ?breath sounds clear to auscultation ? ? ? ? ? ? Cardiovascular ?hypertension,  ?Rhythm:Regular Rate:Normal ? ? ?  ?Neuro/Psych ? Headaches, PSYCHIATRIC DISORDERS  Neuromuscular disease   ? GI/Hepatic ?negative GI ROS, Neg liver ROS,   ?Endo/Other  ?Hypothyroidism  ? Renal/GU ?  ? ?  ?Musculoskeletal ? ? Abdominal ?  ?Peds ? Hematology ?  ?Anesthesia Other Findings ? ? Reproductive/Obstetrics ? ?  ? ? ? ? ? ? ? ? ? ? ? ? ? ?  ?  ? ? ? ? ? ? ? ? ?Anesthesia Physical ?Anesthesia Plan ? ?ASA: 3 ? ?Anesthesia Plan: General  ? ?Post-op Pain Management:   ? ?Induction: Intravenous ? ?PONV Risk Score and Plan: 3 and Ondansetron, Dexamethasone and Midazolam ? ?Airway Management Planned: Oral ETT ? ?Additional Equipment:  ? ?Intra-op Plan:  ? ?Post-operative Plan: Extubation in OR ? ?Informed Consent: I have reviewed the patients History and Physical, chart, labs and discussed the procedure including the risks, benefits and alternatives for the proposed anesthesia with the patient or authorized representative who has indicated his/her understanding and acceptance.  ? ? ? ?Dental advisory given ? ?Plan Discussed with: CRNA and Anesthesiologist ? ?Anesthesia Plan Comments:   ? ? ? ? ? ?Anesthesia Quick Evaluation ? ?

## 2021-07-03 NOTE — Discharge Instructions (Addendum)
No ibuprofen, Advil, Aleve, Motrin, ketorolac, meloxicam, naproxen, or other NSAIDS until after 9:00pm today if needed for pain.  ? ? ? ?DISCHARGE INSTRUCTIONS: Laparoscopy ? ?The following instructions have been prepared to help you care for yourself upon your return home today. ? ?Wound care: ? Do not get the incision wet for the first 24 hours. The incision should be kept clean and dry. ? The Band-Aids or dressings may be removed the day after surgery. ? Should the incision become sore, red, and swollen after the first week, check with your doctor. ? ?Personal hygiene: ? Shower the day after your procedure. ? ?Activity and limitations: ? Do NOT drive or operate any equipment today. ? Do NOT lift anything more than 15 pounds for 2-3 weeks after surgery. ? Do NOT rest in bed all day. ? Walking is encouraged. Walk each day, starting slowly with 5-minute walks 3 or 4 times a day. Slowly increase the length of your walks. ? Walk up and down stairs slowly. ? Do NOT do strenuous activities, such as golfing, playing tennis, bowling, running, biking, weight lifting, gardening, mowing, or vacuuming for 2-4 weeks. Ask your doctor when it is okay to start. ? ?Diet: Eat a light meal as desired this evening. You may resume your usual diet tomorrow. ? ?Return to work: This is dependent on the type of work you do. For the most part you can return to a desk job within a week of surgery. If you are more active at work, please discuss this with your doctor. ? ?What to expect after your surgery: You may have a slight burning sensation when you urinate on the first day. You may have a very small amount of blood in the urine. Expect to have a small amount of vaginal discharge/light bleeding for 1-2 weeks. It is not unusual to have abdominal soreness and bruising for up to 2 weeks. You may be tired and need more rest for about 1 week. You may experience shoulder pain for 24-72 hours. Lying flat in bed may relieve it. ? ?Call your  doctor for any of the following: ? Develop a fever of 100.4 or greater ? Inability to urinate 6 hours after discharge from hospital ? Severe pain not relieved by pain medications ? Persistent of heavy bleeding at incision site ? Redness or swelling around incision site after a week ? Increasing nausea or vomiting ? ? ? ?Post Anesthesia Home Care Instructions ? ?Activity: ?Get plenty of rest for the remainder of the day. A responsible individual must stay with you for 24 hours following the procedure.  ?For the next 24 hours, DO NOT: ?-Drive a car ?-Paediatric nurse ?-Drink alcoholic beverages ?-Take any medication unless instructed by your physician ?-Make any legal decisions or sign important papers. ? ?Meals: ?Start with liquid foods such as gelatin or soup. Progress to regular foods as tolerated. Avoid greasy, spicy, heavy foods. If nausea and/or vomiting occur, drink only clear liquids until the nausea and/or vomiting subsides. Call your physician if vomiting continues. ? ?Special Instructions/Symptoms: ?Your throat may feel dry or sore from the anesthesia or the breathing tube placed in your throat during surgery. If this causes discomfort, gargle with warm salt water. The discomfort should disappear within 24 hours. ?

## 2021-07-03 NOTE — Anesthesia Procedure Notes (Signed)
Procedure Name: Intubation ?Date/Time: 07/03/2021 1:19 PM ?Performed by: Clearnce Sorrel, CRNA ?Pre-anesthesia Checklist: Patient identified, Emergency Drugs available, Suction available and Patient being monitored ?Patient Re-evaluated:Patient Re-evaluated prior to induction ?Oxygen Delivery Method: Circle System Utilized ?Preoxygenation: Pre-oxygenation with 100% oxygen ?Induction Type: IV induction ?Ventilation: Mask ventilation without difficulty ?Grade View: Grade II ?Tube type: Oral ?Tube size: 7.0 mm ?Number of attempts: 1 ?Airway Equipment and Method: Stylet and Oral airway ?Placement Confirmation: ETT inserted through vocal cords under direct vision, positive ETCO2 and breath sounds checked- equal and bilateral ?Secured at: 22 cm ?Tube secured with: Tape ?Dental Injury: Teeth and Oropharynx as per pre-operative assessment  ? ? ? ? ?

## 2021-07-03 NOTE — H&P (Signed)
Kristen [name] is an 61 y.o. adult presenting for scheduled surgery. ? ?Patient is a 60Y P1 menopausal female with persistent right ovarian cyst scheduled for laparoscopic bilateral salpingo-oophorectomy ? ? ?Pt first presented after having a lot of difficulties with walking and more pain and weakness on right side ?Had been diagnosed with neuropathy and went through rehab/PT program and improving but still experiencing more pain on the right side ?Sports Medicine doctor had ordered imaging which led to TVUS evaluation of right ovarian cyst: ? ?Outside [place] 03/29/21 TVUS uterus anteverted 6.8x3.1x4.0 cm ES 4.5 mm; Rt adnexal 6.8 x37x4.7 cm cyst w/ thin septation, no nodularity. ? ?Patient has known right ovarian cyst seen on previous TVUS done for RLQ pain in the office: ? ?11/21/20: midline uterus 5.6x3.7x3.4cm ES ant wall 1.39m post wall 17mno inc BF w/in, Rt ov 2 simple cysts 4x3.4x4.4cm and 3.1x2.4x3.1 cm no solid components  ? ?10/2012 Rtov- simple cyst with septum is noted measuring 4.3 x 2.4 x 4.4cm ? ?No VB, urinary symptoms have improved somewhat but still urinating more frequently- but symptoms can fluctuate  ? ?Patient was still having more right sided pain and interested in cyst removal to see if this helps with pain ?Most recent [place] in office 05/25/21: anteverted ut 6.0 cm w/ tr fluid in endometrium thin walls ant 1.58m58mnd post 1.9mm35momplex right adx cyst now measuring 7.1x3.9x4.5 cm with thin septation and sm focal nodular areas no inc BF, no FF, normal Lt ov ?-Ova1 Panel performed at this time, resulted as low risk for malignancy ? ?Previous c/s x1 and shoulder surgery ?Up to date on Pap 09/2020 WNL ? ?Menstrual History: ? ?Patient's last menstrual period was 08/16/2010. ?  ? ?Past Medical History:  ?Diagnosis Date  ? B12 deficiency anemia   ? BPPV (benign paroxysmal positional vertigo)   ? Depression   ? GAD (generalized anxiety disorder)   ? History of Graves' disease 03/2006  ? s/p RAI   ? Hypertension   ? followed by pcp  ? Hypothyroidism, postablative 2007  ? endocrinologist---- dr gherCruzita Ledererhx grave's s/p RAI 12/ 2007  ? Insomnia   ? Migraine   ? Peripheral neuropathy   ? Right ovarian cyst   ? ? ?Past Surgical History:  ?Procedure Laterality Date  ? COLONOSCOPY WITH PROPOFOL  04/2021  ? SHOULDER ARTHROSCOPY WITH SUBACROMIAL DECOMPRESSION Right 04/11/2020  ? Procedure: SHOULDER ARTHROSCOPY WITH SUBACROMIAL DECOMPRESSION;  Surgeon: ChanTania Ade;  [facility];  Laterality: Right;  ? ? ?Family History  ?Problem Relation Age of Onset  ? Hypertension Father   ? Heart disease Father   ? Healthy Mother   ? ? ?Social History:  reports that she has never smoked. She has never used smokeless tobacco. She reports that she does not currently use alcohol. She reports that she does not use drugs. ? ?Allergies:  ?Allergies  ?Allergen Reactions  ? Benadryl [Diphenhydramine Hcl] Other (See Comments)  ?  Makes her skin crawl and she gets hyped up   ? ? ?Medications Prior to Admission  ?Medication Sig Dispense Refill Last Dose  ? Cyanocobalamin (B-12 COMPLIANCE INJECTION IJ) Inject as directed once a week. Wednesday's   Past Week  ? [facility]at[facility], Misc. ([facility] Apply topically 2 (two) times daily.   07/03/2021 at 0500  ? fexofenadine (ALLEGRA) 60 MG tablet Take 60 mg by mouth 2 (two) times daily as needed for allergies or rhinitis.  07/03/2021 at 0500  ? levothyroxine (SYNTHROID) 112 MCG tablet Take 1 tablet (112 mcg total) by mouth daily. (Patient taking differently: Take 112 mcg by mouth daily.) 45 tablet 3 07/03/2021 at 0500  ? metoprolol tartrate (LOPRESSOR) 50 MG tablet Take 50 mg by mouth daily.   07/03/2021 at 0500  ? thiamine (VITAMIN B-1) 100 MG tablet Take 100 mg by mouth 2 (two) times daily.   07/03/2021 at 0500  ? Wheat Dextrin (BENEFIBER) CHEW Chew by mouth daily.   07/02/2021  ? zolpidem (AMBIEN) 10 MG tablet Take 10 mg by mouth at  bedtime.   07/02/2021  ? ? ?Review of Systems  ?All other systems reviewed and are negative. ? ?Blood pressure (!) 141/85, pulse 62, temperature 98.2 ?F (36.8 ?C), temperature source Oral, resp. rate 20, height '5\' 3"'$  (1.6 m), weight 62.1 kg, last menstrual period 08/16/2010, SpO2 94 %. ?Physical Exam ?Vitals reviewed.  ?Constitutional:   ?   Appearance: Normal appearance.  ?HENT:  ?   Head: Normocephalic.  ?Cardiovascular:  ?   Rate and Rhythm: Normal rate.  ?Pulmonary:  ?   Effort: Pulmonary effort is normal.  ?Abdominal:  ?   General: Abdomen is flat.  ?   Palpations: Abdomen is soft.  ?Musculoskeletal:     ?   General: Normal range of motion.  ?   Cervical back: Normal range of motion.  ?Skin: ?   General: Skin is warm and dry.  ?Neurological:  ?   General: No focal deficit present.  ?   Mental Status: She is alert and oriented to person, place, and time.  ?Psychiatric:     ?   Mood and Affect: Mood normal.     ?   Behavior: Behavior normal.  ? ? ?Results for orders placed or performed during the hospital encounter of 07/03/21 (from the past 24 hour(s))  ?ABO/Rh     Status: None  ? Collection Time: 07/03/21 11:58 AM  ?Result Value Ref Range  ? ABO/RH(D)    ?  A POS ?Performed at [facility], [place] [address], [place], [place] [address] ?  ? ? ?No results found. ? ?Assessment/Plan: ? ?60Y P51 menopausal female with right ovarian cyst consented for laparoscopic bilateral salpingo-oophorectomy ? ?Patient has been thoroughly consented both in the office and again in the preoperative holding unit for the above listed surgery. She has been counseled on the risks of surgery including but not limited to bleeding, infection, damage to surrounding organs, and inherit risks of surgery. She has been counseled on the unlikely but possible risk of occult malignancy which could necessitate additional treatments including another surgery. She has also been counseled on the possibility for persistent right lower  quadrant pain despite surgery. Patient has verbalized good understanding of these risks and wishes to proceed with scheduled surgery. Consents signed and placed in the chart. ? ?-NPO ?-No antibiotics indicated ?-No pregnancy testing indicated as patient is menopausal ?-SCD VTE ppx ?-Routine intraop/postop care ?-Anticipate DC home today. Discussed DC instructions and plan for 2 week postop visit in the office ? ?Toby Breithaupt A Makaylen Thieme ?07/03/2021, 12:55 PM ? ?

## 2021-07-04 ENCOUNTER — Encounter (HOSPITAL_BASED_OUTPATIENT_CLINIC_OR_DEPARTMENT_OTHER): Payer: Self-pay | Admitting: Obstetrics and Gynecology

## 2021-07-04 LAB — SURGICAL PATHOLOGY

## 2021-07-04 LAB — CYTOLOGY - NON PAP

## 2021-07-19 NOTE — Progress Notes (Signed)
? ?I, Wendy Poet, LAT, ATC, am serving as scribe for Dr. Lynne Leader. ? ?Kristen [name] is a 61 y.o. adult who presents to [place] at [facility] today for B knee pain, R>L.  She was last seen by Dr. Tamala Julian on 02/27/21 for  neck and back pain and for f/u after recent falls.  Pt has a recent dx of neuropathy. Today, pt reports B knee pain R>L, intermittently for years.  She locates her pain to anterior lateral aspect of the R knee and more the superior aspect of the L knee. Pt starts PT on Friday to work on her balance and strength since her cyst removal. ? ?Knee swelling: yes ?Knee mechanical symptoms: yes ?Aggravating factors: stairs ?Treatments tried: chiro ? ? ?Pertinent review of systems: No fevers or chills ? ?Relevant historical information: Hypothyroidism. ? ? ?Exam:  ?BP 130/86   Pulse 74   Ht '5\' 3"'$  (1.6 m)   Wt 141 lb (64 kg)   LMP 08/16/2010   SpO2 98%   BMI 24.98 kg/m?  ?General: Well Developed, well nourished, and in no acute distress.  ? ?MSK: Right knee mild effusion otherwise normal appearing ?Normal motion with crepitation. ?Tender palpation medial joint line. ?Stable ligamentous exam. ?Intact strength some pain with extension. ?Mildly positive McMurray's test. ? ?Left knee normal-appearing ?Normal motion with crepitation. ?Tender palpation medial joint line. ?Stable ligamentous exam. ?Mildly positive Murray's test. ?Intact strength some pain with extension. ? ? ? ?Lab and Radiology Results ? ?Procedure: Real-time Ultrasound Guided Injection of right knee superior lateral patellar space ?Device: Philips Affiniti 50G ?Images permanently stored and available for review in PACS ?Ultrasound evaluation prior to injection reveals trace joint effusion. ?Verbal informed consent obtained.  Discussed risks and benefits of procedure. Warned about infection, bleeding, hyperglycemia damage to structures among others. ?Patient expresses understanding and agreement ?Time-out conducted.    ?Noted no overlying erythema, induration, or other signs of local infection.   ?Skin prepped in a sterile fashion.   ?Local anesthesia: Topical Ethyl chloride.   ?With sterile technique and under real time ultrasound guidance: 40 mg of Kenalog and 2 mL of Marcaine injected into knee joint. Fluid seen entering the joint capsule.   ?Completed without difficulty   ?Pain immediately resolved suggesting accurate placement of the medication.   ?Advised to call if fevers/chills, erythema, induration, drainage, or persistent bleeding.   ?Images permanently stored and available for review in the ultrasound unit.  ?Impression: Technically successful ultrasound guided injection. ? ? ?X-ray images bilateral knees obtained today personally and independently interpreted ? ?Right knee: Mild medial DJD.  No acute fractures. ? ?Left knee: Medial DJD.  No acute fractures. ? ?Await formal radiology review ? ? ? ?Assessment and Plan: ?60 y.o. adult with Chronic pain BL knees. ?Pain thought to be due primarily due to patellofemoral pain and patellofemoral chondromalacia.  She does have some degenerative changes at the medial joint line which also could be contributory.  Plan for steroid injection right knee and physical therapy referral.  Plan to check back again in about 8 weeks.  Certainly could see sooner if needed. ? ? ?PDMP not reviewed this encounter. ?Orders Placed This Encounter  ?Procedures  ? [place] LIMITED JOINT SPACE STRUCTURES LOW BILAT(NO LINKED CHARGES)  ?  Order Specific Question:   Reason for Exam (SYMPTOM  OR DIAGNOSIS REQUIRED)  ?  Answer:   R knee pain  ?  Order Specific Question:   Preferred imaging location?  ?  Answer:  [place]  ? DG Knee AP/LAT W/Sun[place] Left  ?  Standing Status:   Future  ?  Number of Occurrences:   1  ?  Standing Expiration Date:   08/19/2021  ?  Order Specific Question:   Reason for Exam (SYMPTOM  OR DIAGNOSIS REQUIRED)  ?  Answer:   bilateral knee pain  ?  Order  Specific Question:   Preferred imaging location?  ?  Answer:   [facility]  ? DG Knee AP/LAT W/Sunrise Right  ?  Standing Status:   Future  ?  Number of Occurrences:   1  ?  Standing Expiration Date:   08/19/2021  ?  Order Specific Question:   Reason for Exam (SYMPTOM  OR DIAGNOSIS REQUIRED)  ?  Answer:   bilateral knee pain  ?  Order Specific Question:   Preferred imaging location?  ?  Answer:   [facility]  ? Ambulatory referral to Physical Therapy  ?  Referral Priority:   Routine  ?  Referral Type:   Physical Medicine  ?  Referral Reason:   Specialty Services Required  ?  Requested Specialty:   Physical Therapy  ?  Number of Visits Requested:   1  ? ?No orders of the defined types were placed in this encounter. ? ? ? ?Discussed warning signs or symptoms. Please see discharge instructions. Patient expresses understanding. ? ? ?The above documentation has been reviewed and is accurate and complete Lynne Leader, M.D. ? ? ?

## 2021-07-20 ENCOUNTER — Ambulatory Visit: Payer: Self-pay

## 2021-07-20 ENCOUNTER — Ambulatory Visit (INDEPENDENT_AMBULATORY_CARE_PROVIDER_SITE_OTHER): Payer: Medicare Other | Admitting: Family Medicine

## 2021-07-20 ENCOUNTER — Ambulatory Visit (INDEPENDENT_AMBULATORY_CARE_PROVIDER_SITE_OTHER): Payer: Medicare Other

## 2021-07-20 VITALS — BP 130/86 | HR 74 | Ht 63.0 in | Wt 141.0 lb

## 2021-07-20 DIAGNOSIS — G8929 Other chronic pain: Secondary | ICD-10-CM

## 2021-07-20 DIAGNOSIS — M25561 Pain in right knee: Secondary | ICD-10-CM | POA: Diagnosis not present

## 2021-07-20 DIAGNOSIS — M25562 Pain in left knee: Secondary | ICD-10-CM

## 2021-07-20 NOTE — Patient Instructions (Addendum)
Thank you for coming in today.  ? ?Please get an Xray today before you leave  ? ?You received an injection today. Seek immediate medical attention if the joint becomes red, extremely painful, or is oozing fluid.  ? ?Please use Voltaren gel (Generic Diclofenac Gel) up to 4x daily for pain as needed.  This is available over-the-counter as both the name brand Voltaren gel and the generic diclofenac gel.  ? ?Recheck back in 2 months ?

## 2021-07-21 NOTE — Progress Notes (Signed)
Left knee x-ray looks normal to radiology

## 2021-07-21 NOTE — Progress Notes (Signed)
Right knee x-ray looks normal to radiology

## 2021-07-28 ENCOUNTER — Encounter: Payer: Self-pay | Admitting: Internal Medicine

## 2021-07-28 ENCOUNTER — Ambulatory Visit: Payer: Medicare Other | Attending: Family Medicine | Admitting: Rehabilitative and Restorative Service Providers"

## 2021-07-28 ENCOUNTER — Encounter: Payer: Self-pay | Admitting: Rehabilitative and Restorative Service Providers"

## 2021-07-28 DIAGNOSIS — M25561 Pain in right knee: Secondary | ICD-10-CM | POA: Diagnosis present

## 2021-07-28 DIAGNOSIS — M542 Cervicalgia: Secondary | ICD-10-CM | POA: Diagnosis present

## 2021-07-28 DIAGNOSIS — R2689 Other abnormalities of gait and mobility: Secondary | ICD-10-CM | POA: Insufficient documentation

## 2021-07-28 DIAGNOSIS — R252 Cramp and spasm: Secondary | ICD-10-CM | POA: Insufficient documentation

## 2021-07-28 DIAGNOSIS — M6281 Muscle weakness (generalized): Secondary | ICD-10-CM | POA: Insufficient documentation

## 2021-07-28 DIAGNOSIS — M25562 Pain in left knee: Secondary | ICD-10-CM | POA: Diagnosis present

## 2021-07-28 DIAGNOSIS — G8929 Other chronic pain: Secondary | ICD-10-CM | POA: Insufficient documentation

## 2021-07-28 DIAGNOSIS — R2681 Unsteadiness on feet: Secondary | ICD-10-CM | POA: Diagnosis present

## 2021-07-28 DIAGNOSIS — M25511 Pain in right shoulder: Secondary | ICD-10-CM | POA: Insufficient documentation

## 2021-07-28 DIAGNOSIS — R42 Dizziness and giddiness: Secondary | ICD-10-CM | POA: Diagnosis present

## 2021-07-28 NOTE — Therapy (Signed)
?OUTPATIENT PHYSICAL THERAPY LOWER EXTREMITY EVALUATION ? ? ?Patient Name: Kristen [name] ?MRN: [id number] ?DOB:03/02/1961, 61 y.o., adult ?Today's Date: 07/28/2021 ? ? PT End of Session - 07/28/21 0854   ? ? Visit Number 1   ? Date for PT Re-Evaluation 09/22/21   ? Authorization Type MCR   ? Progress Note Due on Visit 10   ? PT Start Time [id number]   ? PT Stop Time 0925   ? PT Time Calculation (min) 35 min   ? Activity Tolerance Patient tolerated treatment well   ? Behavior During Therapy [facility] for tasks assessed/performed   ? ?  ?  ? ?  ? ? ?Past Medical History:  ?Diagnosis Date  ? B12 deficiency anemia   ? BPPV (benign paroxysmal positional vertigo)   ? Depression   ? GAD (generalized anxiety disorder)   ? History of Graves' disease 03/2006  ? s/p RAI  ? Hypertension   ? followed by pcp  ? Hypothyroidism, postablative 2007  ? endocrinologist---- dr Cruzita Lederer;   hx grave's s/p RAI 12/ 2007  ? Insomnia   ? Migraine   ? Peripheral neuropathy   ? Right ovarian cyst   ? ?Past Surgical History:  ?Procedure Laterality Date  ? COLONOSCOPY WITH PROPOFOL  04/2021  ? LAPAROSCOPIC BILATERAL SALPINGO OOPHERECTOMY Bilateral 07/03/2021  ? Procedure: LAPAROSCOPIC BILATERAL SALPINGO OOPHORECTOMY; PELVIC WASHINGS;  Surgeon: Armandina Stammer, DO;  Location: [place];  Service: Gynecology;  Laterality: Bilateral;  ? SHOULDER ARTHROSCOPY WITH SUBACROMIAL DECOMPRESSION Right 04/11/2020  ? Procedure: SHOULDER ARTHROSCOPY WITH SUBACROMIAL DECOMPRESSION;  Surgeon: Tania Ade, MD;  Location: [place];  Service: Orthopedics;  Laterality: Right;  ? ?Patient Active Problem List  ? Diagnosis Date Noted  ? Lumbar radiculopathy, right 02/27/2021  ? Posttraumatic headache 11/24/2020  ? Insomnia 06/21/2020  ? Major depression, single episode 06/21/2020  ? Migraine 06/21/2020  ? Posttraumatic stress disorder 06/21/2020  ? Anxiety 06/21/2020  ? Allergic rhinitis 06/21/2020  ? Hyperacusis of both ears 05/17/2020  ?  Laryngopharyngeal reflux (LPR) 05/17/2020  ? Right rotator cuff tear 03/17/2020  ? Excessive physiologic tremor 02/11/2015  ? 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 ? ?PCP: Lazoff, Shawn P, DO ? ?REFERRING PROVIDER: Gregor Hams, MD ? ?REFERRING DIAG: M25.561,M25.562,G89.29 (ICD-10-CM) - Bilateral chronic knee pain ? ?THERAPY DIAG:  ?Muscle weakness (generalized) ? ?Chronic pain of left knee ? ?Chronic pain of right knee ? ?Balance problem ? ?Unsteadiness on feet ? ?Dizziness and giddiness ? ?Cramp and spasm ? ?Cervicalgia ? ?ONSET DATE: 07/20/2021 ? ?SUBJECTIVE:  ? ?SUBJECTIVE STATEMENT: ?Pt reports that her back has been feeling much better since having her ovaries and cyst removed on 07/03/21.  Pt has been having some knee pain and weakness on and off for years and since she is feeling better from having the surgery, she is ready to work with PT.  Pt states that she is still having some cervical and upper trap pain as well. Pt reports that she had a dizziness episode right before she went to surgery. ? ?PERTINENT HISTORY: ?07/03/21 had bilateral ovaries removed secondary to cysts, Anxiety/Depression, Migraine, R shoulder arthroscopy with subacromial decompression on 04/11/20, BPPV ? ?PAIN:  ?Are you having pain? Yes: NPRS scale: 4/10 ?Pain location: bilateral knees, but primarily R knee and R hip ?Pain description: sore ?Aggravating factors: stairs ?Relieving factors: rest and heat ? ?PRECAUTIONS: Fall ? ?WEIGHT BEARING RESTRICTIONS No ? ?FALLS:  ?Has patient fallen in last 6 months? No ? ?LIVING ENVIRONMENT: ?  Lives with: lives with their spouse ?Lives in: House/apartment ?Stairs: Yes: External: 6 steps; none ?Has following equipment at home: None ? ?OCCUPATION: on disability ? ?PLOF: Independent and Leisure: going out with friends, going antiquing ? ?PATIENT GOALS:  To be able to move more freely without having to think about it. ? ? ?OBJECTIVE:  ? ?DIAGNOSTIC FINDINGS:  ?Knee radiographs on 4/13  unremarkable ? ?PATIENT SURVEYS:  ?FOTO 50% at initial evaluation (projected 61% by visit 11) ? ?COGNITION: ? Overall cognitive status: Within functional limits for tasks assessed   ?  ?SENSATION: ?WFL ? ?MUSCLE LENGTH: ?Hamstrings: Right 70 deg; Left 70 deg ? ?POSTURE:  ?Slight forward head, slight rounded shoulders, decreased lumbar lordosis ? ?PALPATION: ?Tender to palpation over left knee, minimal edema noted ? ?LE ROM: ?Bilateral knee A/ROM 0-146 degrees ? ?LE MMT: ? ?MMT Right ?07/28/2021 Left ?07/28/2021  ?Hip flexion 3+ 4  ?Hip extension 3 4-  ?Hip abduction 3+ 4  ?Hip adduction    ?Hip internal rotation    ?Hip external rotation    ?Knee flexion 4 5  ?Knee extension 4 5  ?Ankle dorsiflexion    ?Ankle plantarflexion    ?Ankle inversion    ?Ankle eversion    ? (Blank rows = not tested) ? ?UE MMT: ?B shoulder strength of 4/5 grossly throughout ? ?LOWER EXTREMITY SPECIAL TESTS:  ?Knee special tests: Anterior drawer test: negative and Posterior drawer test: negative ? ?FUNCTIONAL TESTS:  ?5 times sit to stand: 17.4 sec without UE use ?Timed up and go (TUG): 7.9 sec ? ?GAIT: ?Distance walked: 100 ?Assistive device utilized: None ?Level of assistance: Complete Independence ?Comments: Pt with antalgic gait noted with decreased weight bearing through RLE ? ? ? ?TODAY'S TREATMENT: ? ?07/28/2021:  Review of HEP (see below) ? ? ?PATIENT EDUCATION:  ?Education details: Issued HEP ?Person educated: Patient ?Education method: Explanation and Handouts ?Education comprehension: verbalized understanding ? ? ?HOME EXERCISE PROGRAM: ?Access Code: [email or web address] ?URL: [email or web address] ?Date: 07/28/2021 ?Prepared by: Juel Burrow ? ?Exercises ?- Seated Scapular Retraction  - 1 x daily - 7 x weekly - 2 sets - 10 reps ?- Supine Bridge  - 1 x daily - 7 x weekly - 2 sets - 10 reps ?- Supine Active Straight Leg Raise  - 1 x daily - 7 x weekly - 2 sets - 10 reps ?- Sit to Stand with Arms Crossed  - 1 x daily - 7 x weekly -  2 sets - 10 reps ?- Seated Long Arc Quad with Hip Adduction  - 1 x daily - 7 x weekly - 2 sets - 10 reps ? ?ASSESSMENT: ? ?CLINICAL IMPRESSION: ?Patient is a 61 y.o. female who was seen today for physical therapy evaluation and treatment for bilateral knee pain with recent referral as well for shoulder pain. Pt is known to this PT from a previous episode for cervicalgia and dizziness.  Pt reports that she does still have episodes of vertigo.  Pts PLOF is independent and able to go out to dinner with her friends and go antiquing.  Pt presents with decreased UE and LE strength, as well as increased pain, and imbalance of posture.  Pt would benefit from skilled PT to address her functional impairments to allow her to be more independent with less pain around her home. ? ? ?OBJECTIVE IMPAIRMENTS decreased balance, difficulty walking, decreased strength, dizziness, increased muscle spasms, impaired UE functional use, postural dysfunction, and pain.  ? ?ACTIVITY LIMITATIONS cleaning, community activity, meal prep, and  yard work.  ? ?PERSONAL FACTORS 3+ comorbidities: BPPV, R shoulder arthroscopy, Migraine  are also affecting patient's functional outcome.  ? ? ?REHAB POTENTIAL: Good ? ?CLINICAL DECISION MAKING: Evolving/moderate complexity ? ?EVALUATION COMPLEXITY: Moderate ? ? ?GOALS: ?Goals reviewed with patient? Yes ? ?SHORT TERM GOALS: Target date: 08/18/2021 ? ?Pt will be independent with initial HEP. ?Baseline: ?Goal status: INITIAL ? ?2.  Pt will report a 30% improvement in symptoms. ?Baseline:  ?Goal status: INITIAL ? ? ?LONG TERM GOALS: Target date: 09/22/2021 ? ?Pt will be independent with advanced HEP. ?Baseline:  ?Goal status: INITIAL ? ?2.  Pt's knee FOTO will increase to at least 61% to demonstrate improvements in functional mobility. ?Baseline: 50% ?Goal status: INITIAL ? ?3.  Pt will increase BUE and BLE strength to at least 4+/5 throughout to allow her to perform functional tasks in her home. ?Baseline:   ?Goal status: INITIAL ? ?4.  Pt will report being able to reach into overhead cabinets to retrieve objects without increased pain/soreness. ?Baseline:  ?Goal status: INITIAL ? ?5.  Pt will report at least a 75% improvement

## 2021-07-31 ENCOUNTER — Encounter: Payer: Self-pay | Admitting: Internal Medicine

## 2021-07-31 ENCOUNTER — Ambulatory Visit (INDEPENDENT_AMBULATORY_CARE_PROVIDER_SITE_OTHER): Payer: Medicare Other | Admitting: Internal Medicine

## 2021-07-31 VITALS — BP 120/76 | HR 64 | Ht 63.0 in | Wt 139.8 lb

## 2021-07-31 DIAGNOSIS — E890 Postprocedural hypothyroidism: Secondary | ICD-10-CM

## 2021-07-31 DIAGNOSIS — R221 Localized swelling, mass and lump, neck: Secondary | ICD-10-CM | POA: Diagnosis not present

## 2021-07-31 NOTE — Progress Notes (Signed)
?Patient ID: Kristen [name], adult   DOB: 09-05-1960, 61 y.o.   MRN: [id number] ? ?This visit occurred during the SARS-CoV-2 public health emergency.  Safety protocols were in place, including screening questions prior to the visit, additional usage of staff PPE, and extensive cleaning of exam room while observing appropriate contact time as indicated for disinfecting solutions.  ? ?HPI  ?Kristen [name] is a 61 y.o.-year-old adult, returning for f/u for postablative hypothyroidism, dx'ed in 03/2006 after RAI ablation for Graves ds. Last visit 6 months ago. ?PCP: [facility]  ?ObGyn: Dr Dellis Filbert.  ? ?Interim history: ?She has neuropathy >> leg weakness >> falls >> seeing Dr. Charlann Boxer >> in PT. Se has h/o low B vitamins >> now on replacement. ?She has repeated UTIs and yeast infections. She sees urology. ?At last visit, she had thrush and rash on abdomen, forearms and legs.  This improved. ?Also has BPPV. ?She had a BSO 3 weeks ago. Since the surgery, she is losing more hair.  She is wondering whether her thyroid is out of control. ? ?Pt is on LT4 112 mcg daily (increased 02/2021), taken: ?- in am (5:30 AM) ?- fasting ?- at least 30 min from b'fast ?- no calcium ?- no iron ?- stopped multivitamins at lunchtime ?- no PPIs ?- + Biotin-in Hair skin and nails vitamins (stopped some time ago). However, she takes a B complex with apple cider vinegar, which contains 500 mcg biotin. ?Also, on thiamine 2x a day, B12 im once a week. ? ?Reviewed her TFTs: ?Lab Results  ?Component Value Date  ? TSH 0.67 04/11/2021  ? TSH 6.77 (H) 03/07/2021  ? TSH 3.52 02/23/2021  ? TSH 0.40 08/11/2020  ? TSH 3.17 05/09/2020  ? TSH 0.41 04/05/2020  ? FREET4 1.07 04/11/2021  ? FREET4 0.75 03/07/2021  ? FREET4 2.56 (H) 02/23/2021  ? FREET4 1.02 08/11/2020  ? FREET4 1.26 05/09/2020  ? FREET4 1.62 (H) 04/05/2020  ?09/20/2020 (OB/GYN): TSH 1.39 ?10/21/2014: TSH 8.210 ?09/01/2014: TSH 1.590 ?07/26/2014: TSH 0.440 ?06/28/2014: TSH 0.110 ?06/09/2014: TSH  0.150 ?06/09/2014: TSH 0.300 (0.34-4.8) ?04/26/2014: TSH 1.490 ?12/11/2013: TSH 0.290 (0.34-4.8) ? ?Pt denies: ?- feeling nodules in neck ?- hoarseness ?- choking ?- SOB with lying down ?- dysphagia (this is chronic-in the past it resolved after gargling with salt water) ? ?No FH of thyroid disease or cancer. No h/o radiation tx to head or neck other than RAI treatment for Graves' disease. ? ?She continues to have anxiety/PTSD.  ?In 2021 she had steroid injections in her shoulder for rotator cuff.   ? ?ROS: ?+ See HPI ? ?I reviewed pt's medications, allergies, PMH, social hx, family hx, and changes were documented in the history of present illness. Otherwise, unchanged from my initial visit note. ? ?PMH: ?- h/o Graves' disease ?- Post ablative hypothyroidism ?- Anxiety /depression ?- Postmenopausal status  ? ?PMH: ?-No surgical history ? ?Social History  ? ?Social History  ? Marital Status: Married  ?  Spouse Name: N/A  ? Number of Children: 1  ? ?Occupational History  ? N/a ?  ?  Smoking status: Never Smoker   ? Smokeless tobacco: No  ? Alcohol Use: No  ? Drug Use: No  ? ?Current Outpatient Medications  ?Medication Sig Dispense Refill  ? Cyanocobalamin (B-12 COMPLIANCE INJECTION IJ) Inject as directed once a week. Wednesday's    ? Dermatological Products, Misc. [facility] EX) Apply topically 2 (two) times daily.    ? fexofenadine (ALLEGRA) 60 MG tablet Take 60 mg by  mouth 2 (two) times daily as needed for allergies or rhinitis.    ? ibuprofen (ADVIL) 800 MG tablet Take 1 tablet (800 mg total) by mouth every 8 (eight) hours as needed. 30 tablet 0  ? levothyroxine (SYNTHROID) 112 MCG tablet Take 1 tablet (112 mcg total) by mouth daily. (Patient taking differently: Take 112 mcg by mouth daily.) 45 tablet 3  ? metoprolol tartrate (LOPRESSOR) 50 MG tablet Take 50 mg by mouth daily.    ? oxyCODONE (OXY IR/ROXICODONE) 5 MG immediate release tablet Take 1 tablet (5 mg total) by mouth every 6 (six) hours as needed for severe  pain. (Patient not taking: Reported on 07/20/2021) 15 tablet 0  ? sertraline (ZOLOFT) 50 MG tablet Take 50 mg by mouth daily.    ? thiamine (VITAMIN B-1) 100 MG tablet Take 100 mg by mouth 2 (two) times daily.    ? Wheat Dextrin (BENEFIBER) CHEW Chew by mouth daily.    ? zolpidem (AMBIEN) 10 MG tablet Take 10 mg by mouth at bedtime.    ? ?No current facility-administered medications for this visit.  ? ?Allergies  ?Allergen Reactions  ? Benadryl [Diphenhydramine Hcl] Other (See Comments)  ?  Makes her skin crawl and she gets hyped up   ? ?Family history: ?- No thyroid family history  ?- No diabetes, hypertension, hyperlipidemia, cancer in family members  ? ?PE: ?BP 120/76 (BP Location: Left Arm, Patient Position: Sitting, Cuff Size: Normal)   Pulse 64   Ht '5\' 3"'$  (1.6 m)   Wt 139 lb 12.8 oz (63.4 kg)   LMP 08/16/2010   SpO2 98%   BMI 24.76 kg/m?   ?Wt Readings from Last 3 Encounters:  ?07/31/21 139 lb 12.8 oz (63.4 kg)  ?07/20/21 141 lb (64 kg)  ?07/03/21 137 lb (62.1 kg)  ? ?Constitutional: normal weight, in NAD ?Eyes: PERRLA, EOMI, no exophthalmos ?ENT: moist mucous membranes, no thyromegaly, no lymphadenopathy ?Cardiovascular: RRR, No MRG ?Respiratory: CTA B ?Musculoskeletal: no deformities, strength intact in all 4 ?Skin: moist, warm, no rashes ?Neurological: no tremor with outstretched hands, DTR normal in all 4 ? ?ASSESSMENT: ?1.  Post ablative Hypothyroidism ? ?2.  Neck swelling ? ?PLAN:  ?1. Patient with longstanding hypothyroidism, developed after RAI ablation for Graves' disease, with uncontrolled TFTs over the years.  Her TFTs improved after switching to Synthroid d.a.w., however, afterwards, we had to switch back to generic levothyroxine due to price. ?- latest thyroid labs reviewed with pt. >> normal after we increase the dose of her LT4 at last visit: ?Lab Results  ?Component Value Date  ? TSH 0.67 04/11/2021  ?- she continues on LT4 112 mcg daily ?- pt feels good on this dose, however, she mentions  increased hair loss after her BSO surgery 3 weeks ago. ?- we discussed about taking the thyroid hormone every day, with water, >30 minutes before breakfast, separated by >4 hours from acid reflux medications, calcium, iron, multivitamins. Pt. is taking it correctly. ?- will check thyroid tests later in the week: TSH and fT4.  We cannot check this today because she took a vitamins with biotin this morning.  We discussed about stopping it 2 days prior to the next of the labs (because it contains 500 mcg biotin).  She will return in 4 days for labs. ?- If labs are abnormal, she will need to return for repeat TFTs in 1.5 months ?- OTW, RTC in 1 year ? ?2.  Neck swelling ?-At last visit, she was found  to have neck swelling but she did not have neck compression symptoms except for mild dysphagia, which was not new (she also has dry mouth, which can contribute).  I did not feel thyromegaly or thyroid nodules.   ?-At today's visit, there is still no evidence of thyromegaly or nodules.  She does have very mild lymphadenopathy bilaterally on the lateral cervical regions. ?-Of note, thyroid uptake was homogeneous in the past ?-For now, we can continue to monitor her clinically ? ?Orders Placed This Encounter  ?Procedures  ? TSH  ? T4, free  ? ?Philemon Kingdom, MD PhD ?[facility] ? ?

## 2021-07-31 NOTE — Patient Instructions (Addendum)
Please come back for labs Friday. Skip the B complex for 2 days. ? ?Continue levothyroxine 112 mcg daily. ? ?Take the thyroid hormone every day, with water, at least 30 minutes before breakfast, separated by at least 4 hours from: ?- acid reflux medications ?- calcium ?- iron ?- multivitamins ? ?Please return in 6 months. ? ?

## 2021-08-01 ENCOUNTER — Ambulatory Visit: Payer: Medicare Other | Admitting: Rehabilitative and Restorative Service Providers"

## 2021-08-01 ENCOUNTER — Encounter: Payer: Self-pay | Admitting: Rehabilitative and Restorative Service Providers"

## 2021-08-01 ENCOUNTER — Encounter: Payer: Medicare Other | Admitting: Rehabilitative and Restorative Service Providers"

## 2021-08-01 DIAGNOSIS — R252 Cramp and spasm: Secondary | ICD-10-CM

## 2021-08-01 DIAGNOSIS — R2689 Other abnormalities of gait and mobility: Secondary | ICD-10-CM

## 2021-08-01 DIAGNOSIS — R2681 Unsteadiness on feet: Secondary | ICD-10-CM

## 2021-08-01 DIAGNOSIS — M6281 Muscle weakness (generalized): Secondary | ICD-10-CM | POA: Diagnosis not present

## 2021-08-01 DIAGNOSIS — M542 Cervicalgia: Secondary | ICD-10-CM

## 2021-08-01 DIAGNOSIS — G8929 Other chronic pain: Secondary | ICD-10-CM

## 2021-08-01 DIAGNOSIS — R42 Dizziness and giddiness: Secondary | ICD-10-CM

## 2021-08-01 NOTE — Therapy (Signed)
?OUTPATIENT PHYSICAL THERAPY TREATMENT NOTE ? ? ?Patient Name: Kristen [name] ?MRN: [id number] ?DOB:15-May-1960, 61 y.o., adult ?Today's Date: 08/01/2021 ? ?PCP: Percell Belt, DO ?REFERRING PROVIDER: Dr Sherene Sires ? ?END OF SESSION:  ? PT End of Session - 08/01/21 0941   ? ? Visit Number 2   ? Date for PT Re-Evaluation 09/22/21   ? Authorization Type MCR   ? Progress Note Due on Visit 10   ? PT Start Time [phone]   ? PT Stop Time 1015   ? PT Time Calculation (min) 39 min   ? Activity Tolerance Patient tolerated treatment well   ? Behavior During Therapy [facility] for tasks assessed/performed   ? ?  ?  ? ?  ? ? ?Past Medical History:  ?Diagnosis Date  ? B12 deficiency anemia   ? BPPV (benign paroxysmal positional vertigo)   ? Depression   ? GAD (generalized anxiety disorder)   ? History of Graves' disease 03/2006  ? s/p RAI  ? Hypertension   ? followed by pcp  ? Hypothyroidism, postablative 2007  ? endocrinologist---- dr Cruzita Lederer;   hx grave's s/p RAI 12/ 2007  ? Insomnia   ? Migraine   ? Peripheral neuropathy   ? Right ovarian cyst   ? ?Past Surgical History:  ?Procedure Laterality Date  ? COLONOSCOPY WITH PROPOFOL  04/2021  ? LAPAROSCOPIC BILATERAL SALPINGO OOPHERECTOMY Bilateral 07/03/2021  ? Procedure: LAPAROSCOPIC BILATERAL SALPINGO OOPHORECTOMY; PELVIC WASHINGS;  Surgeon: Armandina Stammer, DO;  Location: [place];  Service: Gynecology;  Laterality: Bilateral;  ? SHOULDER ARTHROSCOPY WITH SUBACROMIAL DECOMPRESSION Right 04/11/2020  ? Procedure: SHOULDER ARTHROSCOPY WITH SUBACROMIAL DECOMPRESSION;  Surgeon: Tania Ade, MD;  Location: [place];  Service: Orthopedics;  Laterality: Right;  ? ?Patient Active Problem List  ? Diagnosis Date Noted  ? Lumbar radiculopathy, right 02/27/2021  ? Posttraumatic headache 11/24/2020  ? Insomnia 06/21/2020  ? Major depression, single episode 06/21/2020  ? Migraine 06/21/2020  ? Posttraumatic stress disorder 06/21/2020  ? Anxiety 06/21/2020  ?  Allergic rhinitis 06/21/2020  ? Hyperacusis of both ears 05/17/2020  ? Laryngopharyngeal reflux (LPR) 05/17/2020  ? Right rotator cuff tear 03/17/2020  ? Excessive physiologic tremor 02/11/2015  ? 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 ? ?REFERRING DIAG: M25.561,M25.562,G89.29 (ICD-10-CM) - Bilateral chronic knee pain ? ?THERAPY DIAG:  ?Muscle weakness (generalized) ? ?Chronic pain of left knee ? ?Chronic pain of right knee ? ?Balance problem ? ?Unsteadiness on feet ? ?Dizziness and giddiness ? ?Cramp and spasm ? ?Cervicalgia ? ?PERTINENT HISTORY: 07/03/21 had bilateral ovaries removed secondary to cysts, Anxiety/Depression, Migraine, R shoulder arthroscopy with subacromial decompression on 04/11/20, BPPV ? ?PRECAUTIONS: Fall ? ?SUBJECTIVE: Pt reports that she had the doctor check her thyroid levels and she is awaiting results. ? ?PAIN:  ?Are you having pain? Yes: NPRS scale: 3/10 ?Pain location: bilat knees ?Pain description: soreness ?Aggravating factors: increased use ?Relieving factors: rest ? ?PATIENT GOALS:  To be able to move more freely without having to think about it. ? ?OBJECTIVE: (objective measures completed at initial evaluation unless otherwise dated) ? ?DIAGNOSTIC FINDINGS:  ?Knee radiographs on 4/13 unremarkable ?  ?PATIENT SURVEYS:  ?FOTO 50% at initial evaluation (projected 61% by visit 11) ?  ?COGNITION: ?          Overall cognitive status: Within functional limits for tasks assessed               ?           ?  SENSATION: ?WFL ?  ?MUSCLE LENGTH: ?Hamstrings: Right 70 deg; Left 70 deg ?  ?POSTURE:  ?Slight forward head, slight rounded shoulders, decreased lumbar lordosis ?  ?PALPATION: ?Tender to palpation over left knee, minimal edema noted ?  ?LE ROM: ?Bilateral knee A/ROM 0-146 degrees ?  ?LE MMT: ?  ?MMT Right ?07/28/2021 Left ?07/28/2021  ?Hip flexion 3+ 4  ?Hip extension 3 4-  ?Hip abduction 3+ 4  ?Hip adduction      ?Hip internal rotation      ?Hip external rotation      ?Knee flexion 4 5   ?Knee extension 4 5  ?Ankle dorsiflexion      ?Ankle plantarflexion      ?Ankle inversion      ?Ankle eversion      ? (Blank rows = not tested) ?  ?UE MMT: ?B shoulder strength of 4/5 grossly throughout ?  ?LOWER EXTREMITY SPECIAL TESTS:  ?Knee special tests: Anterior drawer test: negative and Posterior drawer test: negative ?  ?FUNCTIONAL TESTS:  ?5 times sit to stand: 17.4 sec without UE use ?Timed up and go (TUG): 7.9 sec ?  ?GAIT: ?Distance walked: 100 ?Assistive device utilized: None ?Level of assistance: Complete Independence ?Comments: Pt with antalgic gait noted with decreased weight bearing through RLE ? ? VESTIBULAR ASSESSMENT: ?08/01/2021:  [name] negative bilateral sides ?  ?  ?  ?TODAY'S TREATMENT: ?  ?08/01/2021: ?UBE level 1.0 x3 min each way ?Seated with 2# ankle weights:  LAQ, marching, hip abduction.  2x10 bilat ?Sit to/from stand 2x10 with hands on thighs ?Standing at barre with 2# ankle weights:  heel raises, high marching, hip abduction, hip flexion.  BLE 2x10 each ?Standing rows and shoulder extension with red tband 2x10 bilat ?Dix Hallpike negative bilaterally ?VOR 1x1 in sitting for horizontal and vertical x30 sec bilat ? ?07/28/2021:  Review of HEP (see below) ?  ?  ?PATIENT EDUCATION:  ?Education details: Issued HEP ?Person educated: Patient ?Education method: Explanation and Handouts ?Education comprehension: verbalized understanding ?  ?  ?HOME EXERCISE PROGRAM: ?Access Code: [email or web address] ?URL: [email or web address] ?Date: 07/28/2021 ?Prepared by: Juel Burrow ?  ?Exercises ?- Seated Scapular Retraction  - 1 x daily - 7 x weekly - 2 sets - 10 reps ?- Supine Bridge  - 1 x daily - 7 x weekly - 2 sets - 10 reps ?- Supine Active Straight Leg Raise  - 1 x daily - 7 x weekly - 2 sets - 10 reps ?- Sit to Stand with Arms Crossed  - 1 x daily - 7 x weekly - 2 sets - 10 reps ?- Seated Long Arc Quad with Hip Adduction  - 1 x daily - 7 x weekly - 2 sets - 10 reps ?  ?ASSESSMENT: ?   ?CLINICAL IMPRESSION: ?Ms Hoopingarner presented to PT session with reports of soreness, not pain, and agreeable to participation in PT.  Pt requires only minimal seated recovery periods throughout secondary to LE muscle fatigue.  Assessed [name] and pt without dizziness or nystagmus noted on bilat sides. Pt to start aquatic PT tomorrow to assist with therex in buoyant environment. ?  ?  ?OBJECTIVE IMPAIRMENTS decreased balance, difficulty walking, decreased strength, dizziness, increased muscle spasms, impaired UE functional use, postural dysfunction, and pain.  ?  ?ACTIVITY LIMITATIONS cleaning, community activity, meal prep, and yard work.  ?  ?PERSONAL FACTORS 3+ comorbidities: BPPV, R shoulder arthroscopy, Migraine  are also affecting patient's functional outcome.  ?  ?  ?REHAB  POTENTIAL: Good ?  ?CLINICAL DECISION MAKING: Evolving/moderate complexity ?  ?EVALUATION COMPLEXITY: Moderate ?  ?  ?GOALS: ?Goals reviewed with patient? Yes ?  ?SHORT TERM GOALS: Target date: 08/18/2021 ?  ?Pt will be independent with initial HEP. ?Baseline: ?Goal status: ONGOING ?  ?2.  Pt will report a 30% improvement in symptoms. ?Baseline:  ?Goal status: ONGOING ?  ?  ?LONG TERM GOALS: Target date: 09/22/2021 ?  ?Pt will be independent with advanced HEP. ?Baseline:  ?Goal status: INITIAL ?  ?2.  Pt's knee FOTO will increase to at least 61% to demonstrate improvements in functional mobility. ?Baseline: 50% ?Goal status: INITIAL ?  ?3.  Pt will increase BUE and BLE strength to at least 4+/5 throughout to allow her to perform functional tasks in her home. ?Baseline:  ?Goal status: INITIAL ?  ?4.  Pt will report being able to reach into overhead cabinets to retrieve objects without increased pain/soreness. ?Baseline:  ?Goal status: INITIAL ?  ?5.  Pt will report at least a 75% improvement in dizziness. ?Baseline:  ?Goal status: INITIAL ?  ?  ?PLAN: ?PT FREQUENCY: 2x/week ?  ?PT DURATION: 8 weeks ?  ?PLANNED INTERVENTIONS: Therapeutic  exercises, Therapeutic activity, Neuromuscular re-education, Balance training, Gait training, Patient/Family education, Joint mobilization, Stair training, Vestibular training, Canalith repositioning, Aquatic The

## 2021-08-01 NOTE — Patient Instructions (Signed)
? ? ? ?  [facility] Welcome to [facility]! Here you will find all the information you will need regarding your pool therapy. If you have further questions at any time, please call our office at [phone]. After completing your initial evaluation in the [facility], you may be eligible to complete a portion of your therapy in the pool. A typical week of therapy will consist of 1-2 typical physical therapy visits at our [facility] location and an additional session of therapy in the pool located at the [facility] at [address]. [address], [place] [address]. The phone number at the pool site is [phone]. Please call this number if you are running late or need to cancel your appointment.  Aquatic therapy will be offered on Wednesday mornings and Friday afternoons. Each session will last approximately 45 minutes. All scheduling and payments for aquatic therapy sessions, including cancelations, will be done through our [facility] location.  To be eligible for aquatic therapy, these criteria must be met: You must be able to independently change in the locker room and get to the pool deck. A caregiver can come with you to help if needed. There are benches for a caregiver to sit on next to the pool. No one with an open wound is permitted in the pool.  Handicap parking is available in the front and there is a drop off option for even closer accessibility. Please arrive 15 minutes prior to your appointment to prepare for your pool session. You must sign in at the front desk upon your arrival. Please be sure to attend to any toileting needs prior to entering the pool. Locker rooms for changing are available.  There is direct access to the pool deck from the locker room. You can lock your belongings in a locker or bring them with you poolside. Your therapist will greet you on the pool deck. There may be other swimmers in the pool at the  same time but your session is one-on-one with the therapist.   

## 2021-08-02 ENCOUNTER — Ambulatory Visit: Payer: Medicare Other | Admitting: Physical Therapy

## 2021-08-02 ENCOUNTER — Encounter: Payer: Self-pay | Admitting: Physical Therapy

## 2021-08-02 DIAGNOSIS — M6281 Muscle weakness (generalized): Secondary | ICD-10-CM

## 2021-08-02 DIAGNOSIS — R2689 Other abnormalities of gait and mobility: Secondary | ICD-10-CM

## 2021-08-02 DIAGNOSIS — G8929 Other chronic pain: Secondary | ICD-10-CM

## 2021-08-02 DIAGNOSIS — R2681 Unsteadiness on feet: Secondary | ICD-10-CM

## 2021-08-02 NOTE — Therapy (Signed)
?OUTPATIENT PHYSICAL THERAPY TREATMENT NOTE ? ? ?Patient Name: Kristen [name] ?MRN: [id number] ?DOB:1960-07-12, 61 y.o., adult ?Today's Date: 08/02/2021 ? ?PCP: Percell Belt, DO ?REFERRING PROVIDER: Dr Sherene Sires ? ?END OF SESSION:  ? PT End of Session - 08/02/21 1023   ? ? Visit Number 3   ? Authorization Type MCR   ? Progress Note Due on Visit 10   ? PT Start Time [phone]   ? PT Stop Time 1010   ? PT Time Calculation (min) 39 min   ? Activity Tolerance Patient tolerated treatment well   ? Behavior During Therapy [facility] for tasks assessed/performed   ? ?  ?  ? ?  ? ? ? ?Past Medical History:  ?Diagnosis Date  ? B12 deficiency anemia   ? BPPV (benign paroxysmal positional vertigo)   ? Depression   ? GAD (generalized anxiety disorder)   ? History of Graves' disease 03/2006  ? s/p RAI  ? Hypertension   ? followed by pcp  ? Hypothyroidism, postablative 2007  ? endocrinologist---- dr Cruzita Lederer;   hx grave's s/p RAI 12/ 2007  ? Insomnia   ? Migraine   ? Peripheral neuropathy   ? Right ovarian cyst   ? ?Past Surgical History:  ?Procedure Laterality Date  ? COLONOSCOPY WITH PROPOFOL  04/2021  ? LAPAROSCOPIC BILATERAL SALPINGO OOPHERECTOMY Bilateral 07/03/2021  ? Procedure: LAPAROSCOPIC BILATERAL SALPINGO OOPHORECTOMY; PELVIC WASHINGS;  Surgeon: Armandina Stammer, DO;  Location: [facility];  Service: Gynecology;  Laterality: Bilateral;  ? SHOULDER ARTHROSCOPY WITH SUBACROMIAL DECOMPRESSION Right 04/11/2020  ? Procedure: SHOULDER ARTHROSCOPY WITH SUBACROMIAL DECOMPRESSION;  Surgeon: Tania Ade, MD;  Location: [facility];  Service: Orthopedics;  Laterality: Right;  ? ?Patient Active Problem List  ? Diagnosis Date Noted  ? Lumbar radiculopathy, right 02/27/2021  ? Posttraumatic headache 11/24/2020  ? Insomnia 06/21/2020  ? Major depression, single episode 06/21/2020  ? Migraine 06/21/2020  ? Posttraumatic stress disorder 06/21/2020  ? Anxiety 06/21/2020  ? Allergic rhinitis 06/21/2020  ?  Hyperacusis of both ears 05/17/2020  ? Laryngopharyngeal reflux (LPR) 05/17/2020  ? Right rotator cuff tear 03/17/2020  ? Excessive physiologic tremor 02/11/2015  ? 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 ? ?REFERRING DIAG: M25.561,M25.562,G89.29 (ICD-10-CM) - Bilateral chronic knee pain ? ?THERAPY DIAG:  ?Muscle weakness (generalized) ? ?Chronic pain of left knee ? ?Chronic pain of right knee ? ?Balance problem ? ?Unsteadiness on feet ? ?PERTINENT HISTORY: 07/03/21 had bilateral ovaries removed secondary to cysts, Anxiety/Depression, Migraine, R shoulder arthroscopy with subacromial decompression on 04/11/20, BPPV ? ?PRECAUTIONS: Fall ? ?SUBJECTIVE: I had a good workout yesterday. ?PAIN:  ?Are you having pain? No ? ?PATIENT GOALS:  To be able to move more freely without having to think about it. ? ?OBJECTIVE: (objective measures completed at initial evaluation unless otherwise dated) ? ?DIAGNOSTIC FINDINGS:  ?Knee radiographs on 4/13 unremarkable ?  ?PATIENT SURVEYS:  ?FOTO 50% at initial evaluation (projected 61% by visit 11) ?  ?COGNITION: ?          Overall cognitive status: Within functional limits for tasks assessed               ?           ?SENSATION: ?WFL ?  ?MUSCLE LENGTH: ?Hamstrings: Right 70 deg; Left 70 deg ?  ?POSTURE:  ?Slight forward head, slight rounded shoulders, decreased lumbar lordosis ?  ?PALPATION: ?Tender to palpation over left knee, minimal edema noted ?  ?LE ROM: ?Bilateral knee A/ROM  0-146 degrees ?  ?LE MMT: ?  ?MMT Right ?07/28/2021 Left ?07/28/2021  ?Hip flexion 3+ 4  ?Hip extension 3 4-  ?Hip abduction 3+ 4  ?Hip adduction      ?Hip internal rotation      ?Hip external rotation      ?Knee flexion 4 5  ?Knee extension 4 5  ?Ankle dorsiflexion      ?Ankle plantarflexion      ?Ankle inversion      ?Ankle eversion      ? (Blank rows = not tested) ?  ?UE MMT: ?B shoulder strength of 4/5 grossly throughout ?  ?LOWER EXTREMITY SPECIAL TESTS:  ?Knee special tests: Anterior drawer test: negative  and Posterior drawer test: negative ?  ?FUNCTIONAL TESTS:  ?5 times sit to stand: 17.4 sec without UE use ?Timed up and go (TUG): 7.9 sec ?  ?GAIT: ?Distance walked: 100 ?Assistive device utilized: None ?Level of assistance: Complete Independence ?Comments: Pt with antalgic gait noted with decreased weight bearing through RLE ? ? VESTIBULAR ASSESSMENT: ?08/01/2021:  [name] negative bilateral sides ?  ?  ?  ?TODAY'S TREATMENT ? 08/02/21: Pt arrives for aquatic physical therapy. Treatment took place in 3.5-5.5 feet of water. Water temperature was 92 degrees F . Pt entered the pool via stairs with mild use of rails. Pt requires buoyancy of water for support and to offload joints with strengthening exercises.   ?Seated water bench with 75% submersion ?Pt performed seated LE AROM exercises 20x in all planes, concurrent review of status and pain assessment. ?Standing in 50%- 75% depth water pt performed water walking in all 4 directions 6x. ?Hip 3 ways 20x each bil holding on to side of pool gently. Marching across the pool holding onto small noodle 4 lengths.KNee extensions with blue noodle 10x Bil.  Underwater bicycle with noodle behind pt x 4 min.  ?  ?08/01/2021: ?UBE level 1.0 x3 min each way ?Seated with 2# ankle weights:  LAQ, marching, hip abduction.  2x10 bilat ?Sit to/from stand 2x10 with hands on thighs ?Standing at barre with 2# ankle weights:  heel raises, high marching, hip abduction, hip flexion.  BLE 2x10 each ?Standing rows and shoulder extension with red tband 2x10 bilat ?Dix Hallpike negative bilaterally ?VOR 1x1 in sitting for horizontal and vertical x30 sec bilat ? ?07/28/2021:  Review of HEP (see below) ?  ?  ?PATIENT EDUCATION:  ?Education details: Issued HEP ?Person educated: Patient ?Education method: Explanation and Handouts ?Education comprehension: verbalized understanding ?  ?  ?HOME EXERCISE PROGRAM: ?Access Code: [place] ?URL: [email or web address] ?Date:  07/28/2021 ?Prepared by: Juel Burrow ?  ?Exercises ?- Seated Scapular Retraction  - 1 x daily - 7 x weekly - 2 sets - 10 reps ?- Supine Bridge  - 1 x daily - 7 x weekly - 2 sets - 10 reps ?- Supine Active Straight Leg Raise  - 1 x daily - 7 x weekly - 2 sets - 10 reps ?- Sit to Stand with Arms Crossed  - 1 x daily - 7 x weekly - 2 sets - 10 reps ?- Seated Long Arc Quad with Hip Adduction  - 1 x daily - 7 x weekly - 2 sets - 10 reps ?  ?ASSESSMENT: ?  ?CLINICAL IMPRESSION: ? Pt arrives with no complaints of pain. She reports "feeling her muscles" froom  yesterdays workout but in a good way. Pt was educated in water principles and how we would be using them along with completing  a level 1 LE workout. Pt had no pain or any issues (no dizziness) throuhgout the entire session.  ?  ?OBJECTIVE IMPAIRMENTS decreased balance, difficulty walking, decreased strength, dizziness, increased muscle spasms, impaired UE functional use, postural dysfunction, and pain.  ?  ?ACTIVITY LIMITATIONS cleaning, community activity, meal prep, and yard work.  ?  ?PERSONAL FACTORS 3+ comorbidities: BPPV, R shoulder arthroscopy, Migraine  are also affecting patient's functional outcome.  ?  ?  ?REHAB POTENTIAL: Good ?  ?CLINICAL DECISION MAKING: Evolving/moderate complexity ?  ?EVALUATION COMPLEXITY: Moderate ?  ?  ?GOALS: ?Goals reviewed with patient? Yes ?  ?SHORT TERM GOALS: Target date: 08/18/2021 ?  ?Pt will be independent with initial HEP. ?Baseline: ?Goal status: ONGOING ?  ?2.  Pt will report a 30% improvement in symptoms. ?Baseline:  ?Goal status: ONGOING ?  ?  ?LONG TERM GOALS: Target date: 09/22/2021 ?  ?Pt will be independent with advanced HEP. ?Baseline:  ?Goal status: INITIAL ?  ?2.  Pt's knee FOTO will increase to at least 61% to demonstrate improvements in functional mobility. ?Baseline: 50% ?Goal status: INITIAL ?  ?3.  Pt will increase BUE and BLE strength to at least 4+/5 throughout to allow her to perform functional tasks in  her home. ?Baseline:  ?Goal status: INITIAL ?  ?4.  Pt will report being able to reach into overhead cabinets to retrieve objects without increased pain/soreness. ?Baseline:  ?Goal status: INITIAL ?  ?5.  Pt will report at least

## 2021-08-04 ENCOUNTER — Encounter: Payer: Self-pay | Admitting: Internal Medicine

## 2021-08-04 ENCOUNTER — Other Ambulatory Visit (INDEPENDENT_AMBULATORY_CARE_PROVIDER_SITE_OTHER): Payer: Medicare Other

## 2021-08-04 ENCOUNTER — Other Ambulatory Visit: Payer: Self-pay | Admitting: Internal Medicine

## 2021-08-04 DIAGNOSIS — E890 Postprocedural hypothyroidism: Secondary | ICD-10-CM

## 2021-08-04 LAB — T4, FREE: Free T4: 0.92 ng/dL (ref 0.60–1.60)

## 2021-08-04 LAB — TSH: TSH: 0.15 u[IU]/mL — ABNORMAL LOW (ref 0.35–5.50)

## 2021-08-04 MED ORDER — LEVOTHYROXINE SODIUM 100 MCG PO TABS: 100.0000 ug | ORAL_TABLET | Freq: Every day | ORAL | 3 refills | Status: DC

## 2021-08-08 ENCOUNTER — Encounter: Payer: Medicare Other | Admitting: Rehabilitative and Restorative Service Providers"

## 2021-08-11 ENCOUNTER — Ambulatory Visit: Payer: Medicare Other | Admitting: Physical Therapy

## 2021-08-14 NOTE — Therapy (Signed)
?OUTPATIENT PHYSICAL THERAPY TREATMENT NOTE ? ? ?Patient Name: Kristen [name] ?MRN: [id number] ?DOB:12-Sep-1960, 61 y.o., adult ?Today's Date: 08/16/2021 ? ?PCP: Percell Belt, DO ?REFERRING PROVIDER: Dr Sherene Sires ? ?END OF SESSION:  ? PT End of Session - 08/16/21 0951   ? ? Visit Number 5   ? Authorization Type MCR   ? PT Start Time [phone]   ? PT Stop Time 5621   ? PT Time Calculation (min) 45 min   ? Activity Tolerance Patient tolerated treatment well   ? ?  ?  ? ?  ? ? ? ? ?Past Medical History:  ?Diagnosis Date  ? B12 deficiency anemia   ? BPPV (benign paroxysmal positional vertigo)   ? Depression   ? GAD (generalized anxiety disorder)   ? History of Graves' disease 03/2006  ? s/p RAI  ? Hypertension   ? followed by pcp  ? Hypothyroidism, postablative 2007  ? endocrinologist---- dr Cruzita Lederer;   hx grave's s/p RAI 12/ 2007  ? Insomnia   ? Migraine   ? Peripheral neuropathy   ? Right ovarian cyst   ? ?Past Surgical History:  ?Procedure Laterality Date  ? COLONOSCOPY WITH PROPOFOL  04/2021  ? LAPAROSCOPIC BILATERAL SALPINGO OOPHERECTOMY Bilateral 07/03/2021  ? Procedure: LAPAROSCOPIC BILATERAL SALPINGO OOPHORECTOMY; PELVIC WASHINGS;  Surgeon: Armandina Stammer, DO;  Location: [facility];  Service: Gynecology;  Laterality: Bilateral;  ? SHOULDER ARTHROSCOPY WITH SUBACROMIAL DECOMPRESSION Right 04/11/2020  ? Procedure: SHOULDER ARTHROSCOPY WITH SUBACROMIAL DECOMPRESSION;  Surgeon: Tania Ade, MD;  Location: [facility];  Service: Orthopedics;  Laterality: Right;  ? ?Patient Active Problem List  ? Diagnosis Date Noted  ? Lumbar radiculopathy, right 02/27/2021  ? Posttraumatic headache 11/24/2020  ? Insomnia 06/21/2020  ? Major depression, single episode 06/21/2020  ? Migraine 06/21/2020  ? Posttraumatic stress disorder 06/21/2020  ? Anxiety 06/21/2020  ? Allergic rhinitis 06/21/2020  ? Hyperacusis of both ears 05/17/2020  ? Laryngopharyngeal reflux (LPR) 05/17/2020  ? Right rotator cuff  tear 03/17/2020  ? Excessive physiologic tremor 02/11/2015  ? 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 ? ?REFERRING DIAG: M25.561,M25.562,G89.29 (ICD-10-CM) - Bilateral chronic knee pain ? ?THERAPY DIAG:  ?Muscle weakness (generalized) ? ?Chronic pain of left knee ? ?Chronic pain of right knee ? ?Balance problem ? ?Unsteadiness on feet ? ?PERTINENT HISTORY: 07/03/21 had bilateral ovaries removed secondary to cysts, Anxiety/Depression, Migraine, R shoulder arthroscopy with subacromial decompression on 04/11/20, BPPV ? ?PRECAUTIONS: Fall ? ?SUBJECTIVE: My thyroid levels last week were very low and I had to cancel my therapy.MD adjusted my meds and I feel much better. ? ?PAIN:  ?Are you having pain? No ? ?PATIENT GOALS:  To be able to move more freely without having to think about it. ? ?OBJECTIVE: (objective measures completed at initial evaluation unless otherwise dated) ? ?DIAGNOSTIC FINDINGS:  ?Knee radiographs on 4/13 unremarkable ?  ?PATIENT SURVEYS:  ?FOTO 50% at initial evaluation (projected 61% by visit 11) ?  ?COGNITION: ?          Overall cognitive status: Within functional limits for tasks assessed               ?           ?SENSATION: ?WFL ?  ?MUSCLE LENGTH: ?Hamstrings: Right 70 deg; Left 70 deg ?  ?POSTURE:  ?Slight forward head, slight rounded shoulders, decreased lumbar lordosis ?  ?PALPATION: ?Tender to palpation over left knee, minimal edema noted ?  ?LE ROM: ?Bilateral knee A/ROM  0-146 degrees ?  ?LE MMT: ?  ?MMT Right ?07/28/2021 Left ?07/28/2021  ?Hip flexion 3+ 4  ?Hip extension 3 4-  ?Hip abduction 3+ 4  ?Hip adduction      ?Hip internal rotation      ?Hip external rotation      ?Knee flexion 4 5  ?Knee extension 4 5  ?Ankle dorsiflexion      ?Ankle plantarflexion      ?Ankle inversion      ?Ankle eversion      ? (Blank rows = not tested) ?  ?UE MMT: ?B shoulder strength of 4/5 grossly throughout ?  ?LOWER EXTREMITY SPECIAL TESTS:  ?Knee special tests: Anterior drawer test: negative and Posterior  drawer test: negative ?  ?FUNCTIONAL TESTS:  ?5 times sit to stand: 17.4 sec without UE use ?Timed up and go (TUG): 7.9 sec ?  ?GAIT: ?Distance walked: 100 ?Assistive device utilized: None ?Level of assistance: Complete Independence ?Comments: Pt with antalgic gait noted with decreased weight bearing through RLE ? ? VESTIBULAR ASSESSMENT: ?08/01/2021:  [name] negative bilateral sides ?  ?  ?  ?TODAY'S TREATMENT ? ? 08/16/21: Pt arrives for aquatic physical therapy. Treatment took place in 3.5-5.5 feet of water. Water temperature was 92 degrees F . Pt entered the pool via stairs with mild use of rails. Pt requires buoyancy of water for support and to offload joints with strengthening exercises.   ?Seated water bench with 75% submersion ?Pt performed seated LE AROM exercises 20x in all planes, concurrent review of status and pain assessment.  ?Standing in 50%- 75% depth water pt performed water walking in all 4 directions 6x. ?Hip 3 ways 15x each bil holding on to side of pool gently. Underwater bicycle with noodle behind pt x 4 min. 5 min decompression hang. ?  ?08/02/21: Pt arrives for aquatic physical therapy. Treatment took place in 3.5-5.5 feet of water. Water temperature was 92 degrees F . Pt entered the pool via stairs with mild use of rails. Pt requires buoyancy of water for support and to offload joints with strengthening exercises.   ?Seated water bench with 75% submersion ?Pt performed seated LE AROM exercises 20x in all planes, concurrent review of status and pain assessment. ?Standing in 50%- 75% depth water pt performed water walking in all 4 directions 6x. ?Hip 3 ways 20x each bil holding on to side of pool gently. Marching across the pool holding onto small noodle 4 lengths.KNee extensions with blue noodle 10x Bil.  Underwater bicycle with noodle behind pt x 4 min.  ?  ?08/01/2021: ?UBE level 1.0 x3 min each way ?Seated with 2# ankle weights:  LAQ, marching, hip abduction.  2x10 bilat ?Sit to/from  stand 2x10 with hands on thighs ?Standing at barre with 2# ankle weights:  heel raises, high marching, hip abduction, hip flexion.  BLE 2x10 each ?Standing rows and shoulder extension with red tband 2x10 bilat ?Dix Hallpike negative bilaterally ?VOR 1x1 in sitting for horizontal and vertical x30 sec bilat ? ?07/28/2021:  Review of HEP (see below) ?  ?  ?PATIENT EDUCATION:  ?Education details: Issued HEP ?Person educated: Patient ?Education method: Explanation and Handouts ?Education comprehension: verbalized understanding ?  ?  ?HOME EXERCISE PROGRAM: ?Access Code: [email or web address] ?URL: [email or web address] ?Date: 07/28/2021 ?Prepared by: Juel Burrow ?  ?Exercises ?- Seated Scapular Retraction  - 1 x daily - 7 x weekly - 2 sets - 10 reps ?- Supine Bridge  - 1 x daily - 7 x weekly -  2 sets - 10 reps ?- Supine Active Straight Leg Raise  - 1 x daily - 7 x weekly - 2 sets - 10 reps ?- Sit to Stand with Arms Crossed  - 1 x daily - 7 x weekly - 2 sets - 10 reps ?- Seated Long Arc Quad with Hip Adduction  - 1 x daily - 7 x weekly - 2 sets - 10 reps ?  ?ASSESSMENT: ?  ?CLINICAL IMPRESSION: ? Pt reports she had a combination of very low thyroid levels and possibly too much exercise on her first aquatic PT session. She was extremely fatigued and had global pain. She saw MD and they adjusted her medication. She presents today with fatigue that is improving since last week. Pt did request to "take it easy" as she does not feel she is back to her pre low thyroid energy levels. Minor adjustments were made today, mainly longer rest breaks. Pt reported no pain during the treatment. ?  ?OBJECTIVE IMPAIRMENTS decreased balance, difficulty walking, decreased strength, dizziness, increased muscle spasms, impaired UE functional use, postural dysfunction, and pain.  ?  ?ACTIVITY LIMITATIONS cleaning, community activity, meal prep, and yard work.  ?  ?PERSONAL FACTORS 3+ comorbidities: BPPV, R shoulder arthroscopy, Migraine  are  also affecting patient's functional outcome.  ?  ?  ?REHAB POTENTIAL: Good ?  ?CLINICAL DECISION MAKING: Evolving/moderate complexity ?  ?EVALUATION COMPLEXITY: Moderate ?  ?  ?GOALS: ?Goals reviewed wit

## 2021-08-15 ENCOUNTER — Encounter: Payer: Self-pay | Admitting: Rehabilitative and Restorative Service Providers"

## 2021-08-15 ENCOUNTER — Ambulatory Visit: Payer: Medicare Other | Attending: Family Medicine | Admitting: Rehabilitative and Restorative Service Providers"

## 2021-08-15 ENCOUNTER — Encounter: Payer: Medicare Other | Admitting: Rehabilitative and Restorative Service Providers"

## 2021-08-15 DIAGNOSIS — R2689 Other abnormalities of gait and mobility: Secondary | ICD-10-CM | POA: Insufficient documentation

## 2021-08-15 DIAGNOSIS — R252 Cramp and spasm: Secondary | ICD-10-CM | POA: Insufficient documentation

## 2021-08-15 DIAGNOSIS — R42 Dizziness and giddiness: Secondary | ICD-10-CM | POA: Insufficient documentation

## 2021-08-15 DIAGNOSIS — M6281 Muscle weakness (generalized): Secondary | ICD-10-CM | POA: Diagnosis present

## 2021-08-15 DIAGNOSIS — M25562 Pain in left knee: Secondary | ICD-10-CM | POA: Diagnosis present

## 2021-08-15 DIAGNOSIS — M25561 Pain in right knee: Secondary | ICD-10-CM | POA: Insufficient documentation

## 2021-08-15 DIAGNOSIS — G8929 Other chronic pain: Secondary | ICD-10-CM | POA: Diagnosis present

## 2021-08-15 DIAGNOSIS — M542 Cervicalgia: Secondary | ICD-10-CM | POA: Insufficient documentation

## 2021-08-15 DIAGNOSIS — R2681 Unsteadiness on feet: Secondary | ICD-10-CM | POA: Insufficient documentation

## 2021-08-15 NOTE — Therapy (Signed)
?OUTPATIENT PHYSICAL THERAPY TREATMENT NOTE ? ? ?Patient Name: Kristen [name] ?MRN: [id number] ?DOB:Jun 04, 1960, 61 y.o., adult ?Today's Date: 08/15/2021 ? ?PCP: Percell Belt, DO ?REFERRING PROVIDER: Dr Sherene Sires ? ?END OF SESSION:  ? PT End of Session - 08/15/21 1028   ? ? Visit Number 4   ? Date for PT Re-Evaluation 09/22/21   ? Authorization Type MCR   ? Progress Note Due on Visit 10   ? PT Start Time 80   Pt arrived a couple of minutes late and delayed with checking into her appointment  ? PT Stop Time 7062   ? PT Time Calculation (min) 38 min   ? Activity Tolerance Patient tolerated treatment well   ? Behavior During Therapy [facility] for tasks assessed/performed   ? ?  ?  ? ?  ? ? ? ?Past Medical History:  ?Diagnosis Date  ? B12 deficiency anemia   ? BPPV (benign paroxysmal positional vertigo)   ? Depression   ? GAD (generalized anxiety disorder)   ? History of Graves' disease 03/2006  ? s/p RAI  ? Hypertension   ? followed by pcp  ? Hypothyroidism, postablative 2007  ? endocrinologist---- dr Cruzita Lederer;   hx grave's s/p RAI 12/ 2007  ? Insomnia   ? Migraine   ? Peripheral neuropathy   ? Right ovarian cyst   ? ?Past Surgical History:  ?Procedure Laterality Date  ? COLONOSCOPY WITH PROPOFOL  04/2021  ? LAPAROSCOPIC BILATERAL SALPINGO OOPHERECTOMY Bilateral 07/03/2021  ? Procedure: LAPAROSCOPIC BILATERAL SALPINGO OOPHORECTOMY; PELVIC WASHINGS;  Surgeon: Armandina Stammer, DO;  Location: [place];  Service: Gynecology;  Laterality: Bilateral;  ? SHOULDER ARTHROSCOPY WITH SUBACROMIAL DECOMPRESSION Right 04/11/2020  ? Procedure: SHOULDER ARTHROSCOPY WITH SUBACROMIAL DECOMPRESSION;  Surgeon: Tania Ade, MD;  Location: [place];  Service: Orthopedics;  Laterality: Right;  ? ?Patient Active Problem List  ? Diagnosis Date Noted  ? Lumbar radiculopathy, right 02/27/2021  ? Posttraumatic headache 11/24/2020  ? Insomnia 06/21/2020  ? Major depression, single episode 06/21/2020  ? Migraine  06/21/2020  ? Posttraumatic stress disorder 06/21/2020  ? Anxiety 06/21/2020  ? Allergic rhinitis 06/21/2020  ? Hyperacusis of both ears 05/17/2020  ? Laryngopharyngeal reflux (LPR) 05/17/2020  ? Right rotator cuff tear 03/17/2020  ? Excessive physiologic tremor 02/11/2015  ? 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 ? ?REFERRING DIAG: M25.561,M25.562,G89.29 (ICD-10-CM) - Bilateral chronic knee pain ? ?THERAPY DIAG:  ?Muscle weakness (generalized) ? ?Chronic pain of left knee ? ?Chronic pain of right knee ? ?Balance problem ? ?Unsteadiness on feet ? ?Dizziness and giddiness ? ?Cramp and spasm ? ?Cervicalgia ? ?PERTINENT HISTORY: 07/03/21 had bilateral ovaries removed secondary to cysts, Anxiety/Depression, Migraine, R shoulder arthroscopy with subacromial decompression on 04/11/20, BPPV ? ?PRECAUTIONS: Fall ? ?SUBJECTIVE: Pt reports that she found out that her thyroid was low and she has been really fatigued ? ?PAIN:  ?Are you having pain? Yes ?Rating:  5/10 ?Location:  bilateral knees ? ?PATIENT GOALS:  To be able to move more freely without having to think about it. ? ?OBJECTIVE: (objective measures completed at initial evaluation unless otherwise dated) ? ?DIAGNOSTIC FINDINGS:  ?Knee radiographs on 4/13 unremarkable ?  ?PATIENT SURVEYS:  ?FOTO 50% at initial evaluation (projected 61% by visit 11) ?  ?COGNITION: ?          Overall cognitive status: Within functional limits for tasks assessed               ?           ?  SENSATION: ?WFL ?  ?MUSCLE LENGTH: ?Hamstrings: Right 70 deg; Left 70 deg ?  ?POSTURE:  ?Slight forward head, slight rounded shoulders, decreased lumbar lordosis ?  ?PALPATION: ?Tender to palpation over left knee, minimal edema noted ?  ?LE ROM: ?Bilateral knee A/ROM 0-146 degrees ?  ?LE MMT: ?  ?MMT Right ?07/28/2021 Left ?07/28/2021  ?Hip flexion 3+ 4  ?Hip extension 3 4-  ?Hip abduction 3+ 4  ?Hip adduction      ?Hip internal rotation      ?Hip external rotation      ?Knee flexion 4 5  ?Knee extension  4 5  ?Ankle dorsiflexion      ?Ankle plantarflexion      ?Ankle inversion      ?Ankle eversion      ? (Blank rows = not tested) ?  ?UE MMT: ?B shoulder strength of 4/5 grossly throughout ?  ?LOWER EXTREMITY SPECIAL TESTS:  ?Knee special tests: Anterior drawer test: negative and Posterior drawer test: negative ?  ?FUNCTIONAL TESTS:  ?5 times sit to stand: 17.4 sec without UE use ?Timed up and go (TUG): 7.9 sec ?  ?GAIT: ?Distance walked: 100 ?Assistive device utilized: None ?Level of assistance: Complete Independence ?Comments: Pt with antalgic gait noted with decreased weight bearing through RLE ? ? VESTIBULAR ASSESSMENT: ?08/01/2021:  [name] negative bilateral sides ?  ?  ?  ?TODAY'S TREATMENT ?  ?08/15/2021: ?UBE level 1.0 x3 min each way ?Seated with 2# ankle weights:  LAQ, marching, hip abduction scissors.  2x10 bilat ?Sit to/from stand 2x10 with hands on thighs ?Standing at barre with 2# ankle weights:  heel raises, high marching, hip abduction, hip flexion, hip extension.  BLE 2x10 each ?Standing rows and shoulder extension with red tband 2x10 bilat ? ?08/02/21:  ?Pt arrives for aquatic physical therapy. Treatment took place in 3.5-5.5 feet of water. Water temperature was 92 degrees F . Pt entered the pool via stairs with mild use of rails. Pt requires buoyancy of water for support and to offload joints with strengthening exercises.   ?Seated water bench with 75% submersion ?Pt performed seated LE AROM exercises 20x in all planes, concurrent review of status and pain assessment. ?Standing in 50%- 75% depth water pt performed water walking in all 4 directions 6x. ?Hip 3 ways 20x each bil holding on to side of pool gently. Marching across the pool holding onto small noodle 4 lengths.KNee extensions with blue noodle 10x Bil.  Underwater bicycle with noodle behind pt x 4 min.  ?  ?08/01/2021: ?UBE level 1.0 x3 min each way ?Seated with 2# ankle weights:  LAQ, marching, hip abduction.  2x10 bilat ?Sit to/from  stand 2x10 with hands on thighs ?Standing at barre with 2# ankle weights:  heel raises, high marching, hip abduction, hip flexion.  BLE 2x10 each ?Standing rows and shoulder extension with red tband 2x10 bilat ?Dix Hallpike negative bilaterally ?VOR 1x1 in sitting for horizontal and vertical x30 sec bilat ? ?  ?PATIENT EDUCATION:  ?Education details: Issued HEP ?Person educated: Patient ?Education method: Explanation and Handouts ?Education comprehension: verbalized understanding ?  ?  ?HOME EXERCISE PROGRAM: ?Access Code: [email or web address] ?URL: [email or web address] ?Date: 07/28/2021 ?Prepared by: Juel Burrow ?  ?Exercises ?- Seated Scapular Retraction  - 1 x daily - 7 x weekly - 2 sets - 10 reps ?- Supine Bridge  - 1 x daily - 7 x weekly - 2 sets - 10 reps ?- Supine Active Straight Leg Raise  - 1 x  daily - 7 x weekly - 2 sets - 10 reps ?- Sit to Stand with Arms Crossed  - 1 x daily - 7 x weekly - 2 sets - 10 reps ?- Seated Long Arc Quad with Hip Adduction  - 1 x daily - 7 x weekly - 2 sets - 10 reps ?  ?ASSESSMENT: ?  ?CLINICAL IMPRESSION: ? Pt arrives with some complaints of knee pain and overall fatigue from having decreased thyroid levels.  Pt tolerates session well, but did provide multiple seated recovery periods throughout to discuss status.  Pt reports that overall, she is experiencing improvements with working with skilled PT. ?  ?OBJECTIVE IMPAIRMENTS decreased balance, difficulty walking, decreased strength, dizziness, increased muscle spasms, impaired UE functional use, postural dysfunction, and pain.  ?  ?ACTIVITY LIMITATIONS cleaning, community activity, meal prep, and yard work.  ?  ?PERSONAL FACTORS 3+ comorbidities: BPPV, R shoulder arthroscopy, Migraine  are also affecting patient's functional outcome.  ?  ?  ?REHAB POTENTIAL: Good ?  ?CLINICAL DECISION MAKING: Evolving/moderate complexity ?  ?EVALUATION COMPLEXITY: Moderate ?  ?  ?GOALS: ?Goals reviewed with patient? Yes ?  ?SHORT TERM  GOALS: Target date: 08/18/2021 ?  ?Pt will be independent with initial HEP. ?Baseline: ?Goal status: GOAL MET ?  ?2.  Pt will report a 30% improvement in symptoms. ?Baseline:  ?Goal status: ONGOING ?  ?  ?L

## 2021-08-16 ENCOUNTER — Ambulatory Visit: Payer: Medicare Other | Admitting: Physical Therapy

## 2021-08-16 ENCOUNTER — Encounter: Payer: Self-pay | Admitting: Physical Therapy

## 2021-08-16 DIAGNOSIS — R2681 Unsteadiness on feet: Secondary | ICD-10-CM

## 2021-08-16 DIAGNOSIS — M6281 Muscle weakness (generalized): Secondary | ICD-10-CM | POA: Diagnosis not present

## 2021-08-16 DIAGNOSIS — R2689 Other abnormalities of gait and mobility: Secondary | ICD-10-CM

## 2021-08-16 DIAGNOSIS — G8929 Other chronic pain: Secondary | ICD-10-CM

## 2021-08-22 ENCOUNTER — Ambulatory Visit: Payer: Medicare Other | Admitting: Rehabilitative and Restorative Service Providers"

## 2021-08-22 ENCOUNTER — Encounter: Payer: Medicare Other | Admitting: Rehabilitative and Restorative Service Providers"

## 2021-08-22 ENCOUNTER — Encounter: Payer: Self-pay | Admitting: Rehabilitative and Restorative Service Providers"

## 2021-08-22 DIAGNOSIS — G8929 Other chronic pain: Secondary | ICD-10-CM

## 2021-08-22 DIAGNOSIS — R2689 Other abnormalities of gait and mobility: Secondary | ICD-10-CM

## 2021-08-22 DIAGNOSIS — M6281 Muscle weakness (generalized): Secondary | ICD-10-CM | POA: Diagnosis not present

## 2021-08-22 DIAGNOSIS — R2681 Unsteadiness on feet: Secondary | ICD-10-CM

## 2021-08-22 DIAGNOSIS — R42 Dizziness and giddiness: Secondary | ICD-10-CM

## 2021-08-22 DIAGNOSIS — R252 Cramp and spasm: Secondary | ICD-10-CM

## 2021-08-22 DIAGNOSIS — M542 Cervicalgia: Secondary | ICD-10-CM

## 2021-08-22 NOTE — Therapy (Signed)
?OUTPATIENT PHYSICAL THERAPY TREATMENT NOTE ? ? ?Patient Name: Kristen [name] ?MRN: [id number] ?DOB:1961-01-08, 61 y.o., adult ?Today's Date: 08/22/2021 ? ?PCP: Percell Belt, DO ?REFERRING PROVIDER: Dr Sherene Sires ? ?END OF SESSION:  ? PT End of Session - 08/22/21 1023   ? ? Visit Number 6   ? Date for PT Re-Evaluation 09/22/21   ? Authorization Type MCR   ? Progress Note Due on Visit 10   ? PT Start Time 1021   ? PT Stop Time 1100   ? PT Time Calculation (min) 39 min   ? Activity Tolerance Patient tolerated treatment well   ? Behavior During Therapy [facility] for tasks assessed/performed   ? ?  ?  ? ?  ? ? ? ? ?Past Medical History:  ?Diagnosis Date  ? B12 deficiency anemia   ? BPPV (benign paroxysmal positional vertigo)   ? Depression   ? GAD (generalized anxiety disorder)   ? History of Graves' disease 03/2006  ? s/p RAI  ? Hypertension   ? followed by pcp  ? Hypothyroidism, postablative 2007  ? endocrinologist---- dr Cruzita Lederer;   hx grave's s/p RAI 12/ 2007  ? Insomnia   ? Migraine   ? Peripheral neuropathy   ? Right ovarian cyst   ? ?Past Surgical History:  ?Procedure Laterality Date  ? COLONOSCOPY WITH PROPOFOL  04/2021  ? LAPAROSCOPIC BILATERAL SALPINGO OOPHERECTOMY Bilateral 07/03/2021  ? Procedure: LAPAROSCOPIC BILATERAL SALPINGO OOPHORECTOMY; PELVIC WASHINGS;  Surgeon: Armandina Stammer, DO;  Location: [place];  Service: Gynecology;  Laterality: Bilateral;  ? SHOULDER ARTHROSCOPY WITH SUBACROMIAL DECOMPRESSION Right 04/11/2020  ? Procedure: SHOULDER ARTHROSCOPY WITH SUBACROMIAL DECOMPRESSION;  Surgeon: Tania Ade, MD;  Location: [place];  Service: Orthopedics;  Laterality: Right;  ? ?Patient Active Problem List  ? Diagnosis Date Noted  ? Lumbar radiculopathy, right 02/27/2021  ? Posttraumatic headache 11/24/2020  ? Insomnia 06/21/2020  ? Major depression, single episode 06/21/2020  ? Migraine 06/21/2020  ? Posttraumatic stress disorder 06/21/2020  ? Anxiety 06/21/2020  ?  Allergic rhinitis 06/21/2020  ? Hyperacusis of both ears 05/17/2020  ? Laryngopharyngeal reflux (LPR) 05/17/2020  ? Right rotator cuff tear 03/17/2020  ? Excessive physiologic tremor 02/11/2015  ? 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 ? ?REFERRING DIAG: M25.561,M25.562,G89.29 (ICD-10-CM) - Bilateral chronic knee pain ? ?THERAPY DIAG:  ?Muscle weakness (generalized) ? ?Chronic pain of left knee ? ?Chronic pain of right knee ? ?Balance problem ? ?Unsteadiness on feet ? ?Dizziness and giddiness ? ?Cramp and spasm ? ?Cervicalgia ? ?PERTINENT HISTORY: 07/03/21 had bilateral ovaries removed secondary to cysts, Anxiety/Depression, Migraine, R shoulder arthroscopy with subacromial decompression on 04/11/20, BPPV ? ?PRECAUTIONS: Fall ? ?SUBJECTIVE: Pt reports that aquatic PT is helping. ? ?PAIN:  ?Are you having pain? Yes to knees and upper trap region 5-6/10 ? ?PATIENT GOALS:  To be able to move more freely without having to think about it. ? ?OBJECTIVE: (objective measures completed at initial evaluation unless otherwise dated) ? ?DIAGNOSTIC FINDINGS:  ?Knee radiographs on 4/13 unremarkable ?  ?PATIENT SURVEYS:  ?FOTO 50% at initial evaluation (projected 61% by visit 11) ?  ?COGNITION: ?          Overall cognitive status: Within functional limits for tasks assessed               ?           ?SENSATION: ?WFL ?  ?MUSCLE LENGTH: ?Hamstrings: Right 70 deg; Left 70 deg ?  ?POSTURE:  ?  Slight forward head, slight rounded shoulders, decreased lumbar lordosis ?  ?PALPATION: ?Tender to palpation over left knee, minimal edema noted ?  ?LE ROM: ?Bilateral knee A/ROM 0-146 degrees ?  ?LE MMT: ?  ?MMT Right ?07/28/2021 Left ?07/28/2021  ?Hip flexion 3+ 4  ?Hip extension 3 4-  ?Hip abduction 3+ 4  ?Hip adduction      ?Hip internal rotation      ?Hip external rotation      ?Knee flexion 4 5  ?Knee extension 4 5  ?Ankle dorsiflexion      ?Ankle plantarflexion      ?Ankle inversion      ?Ankle eversion      ? (Blank rows = not tested) ?  ?UE  MMT: ?B shoulder strength of 4/5 grossly throughout ?  ?LOWER EXTREMITY SPECIAL TESTS:  ?Knee special tests: Anterior drawer test: negative and Posterior drawer test: negative ?  ?FUNCTIONAL TESTS:  ?5 times sit to stand: 17.4 sec without UE use ?Timed up and go (TUG): 7.9 sec ?  ?GAIT: ?Distance walked: 100 ?Assistive device utilized: None ?Level of assistance: Complete Independence ?Comments: Pt with antalgic gait noted with decreased weight bearing through RLE ? ? VESTIBULAR ASSESSMENT: ?08/01/2021:  [name] negative bilateral sides ?08/22/2021:  Pt reports 2 second dizziness with left [name], proceeded with Epley.  Negative Dix Hallpike on right. ?  ?  ?  ?TODAY'S TREATMENT ? ?08/22/2021: ?UBE level 1.0 x3 min each way ?Seated with 2# ankle weights:  heel/toe raises, LAQ, marching, hip abduction scissors.  2x10 bilat ?Sit to/from stand 2x10 with hands on thighs ?Standing at barre with 2# ankle weights:  heel raises, high marching, hip abduction, hip flexion, hip extension.  BLE 2x10 each ?Epley Maneuver to treat left side x1 rep ?Trigger Point Dry-Needling  ?Treatment instructions: Expect mild to moderate muscle soreness. S/S of pneumothorax if dry needled over a lung field, and to seek immediate medical attention should they occur. Patient verbalized understanding of these instructions and education. ?Patient Consent Given: Yes ?Education handout provided: Yes ?Muscles treated: bilat upper trap, bilat rhomboids, right hamstring ?Treatment response/outcome: Utilized skilled palpation to identify trigger points.  Twitch response illicited and muscle elongation noted  ? ?08/16/21: Pt arrives for aquatic physical therapy. Treatment took place in 3.5-5.5 feet of water. Water temperature was 92 degrees F . Pt entered the pool via stairs with mild use of rails. Pt requires buoyancy of water for support and to offload joints with strengthening exercises.   ?Seated water bench with 75% submersion ?Pt performed  seated LE AROM exercises 20x in all planes, concurrent review of status and pain assessment.  ?Standing in 50%- 75% depth water pt performed water walking in all 4 directions 6x. ?Hip 3 ways 15x each bil holding on to side of pool gently. Underwater bicycle with noodle behind pt x 4 min. 5 min decompression hang. ? ?08/15/2021: ?UBE level 1.0 x3 min each way ?Seated with 2# ankle weights:  LAQ, marching, hip abduction scissors.  2x10 bilat ?Sit to/from stand 2x10 with hands on thighs ?Standing at barre with 2# ankle weights:  heel raises, high marching, hip abduction, hip flexion, hip extension.  BLE 2x10 each ?Standing rows and shoulder extension with red tband 2x10 bilat ?  ?  ?PATIENT EDUCATION:  ?Education details: Issued HEP ?Person educated: Patient ?Education method: Explanation and Handouts ?Education comprehension: verbalized understanding ?  ?  ?HOME EXERCISE PROGRAM: ?Access Code: Francis ?URL: [email or web address] ?Date: 07/28/2021 ?Prepared by: Juel Burrow ?  ?  Exercises ?- Seated Scapular Retraction  - 1 x daily - 7 x weekly - 2 sets - 10 reps ?- Supine Bridge  - 1 x daily - 7 x weekly - 2 sets - 10 reps ?- Supine Active Straight Leg Raise  - 1 x daily - 7 x weekly - 2 sets - 10 reps ?- Sit to Stand with Arms Crossed  - 1 x daily - 7 x weekly - 2 sets - 10 reps ?- Seated Long Arc Quad with Hip Adduction  - 1 x daily - 7 x weekly - 2 sets - 10 reps ?  ?ASSESSMENT: ?  ?CLINICAL IMPRESSION: ?Pt reports she is overall feeling better, states that her R knee pain decreased to 2-3/10 following dry needling with reports of looseness in her upper trap/cervical region.  Pt tolerated session well, but did require recovery periods throughout with monitoring by PT.  Pt continues to require skilled PT to progress towards goal related activities. ?  ?OBJECTIVE IMPAIRMENTS decreased balance, difficulty walking, decreased strength, dizziness, increased muscle spasms, impaired UE functional use, postural  dysfunction, and pain.  ?  ?ACTIVITY LIMITATIONS cleaning, community activity, meal prep, and yard work.  ?  ?PERSONAL FACTORS 3+ comorbidities: BPPV, R shoulder arthroscopy, Migraine  are also affecti

## 2021-08-23 ENCOUNTER — Ambulatory Visit: Payer: Medicare Other | Admitting: Physical Therapy

## 2021-08-23 ENCOUNTER — Encounter: Payer: Self-pay | Admitting: Physical Therapy

## 2021-08-23 DIAGNOSIS — G8929 Other chronic pain: Secondary | ICD-10-CM

## 2021-08-23 DIAGNOSIS — M6281 Muscle weakness (generalized): Secondary | ICD-10-CM | POA: Diagnosis not present

## 2021-08-23 NOTE — Therapy (Signed)
?OUTPATIENT PHYSICAL THERAPY TREATMENT NOTE ? ? ?Patient Name: Kristen [name] ?MRN: [id number] ?DOB:1961-02-01, 61 y.o., adult ?Today's Date: 08/23/2021 ? ?PCP: Percell Belt, DO ?REFERRING PROVIDER: Dr Sherene Sires ? ?END OF SESSION:  ? PT End of Session - 08/23/21 0855   ? ? Visit Number 7   ? Date for PT Re-Evaluation 09/22/21   ? Authorization Type MCR   ? Progress Note Due on Visit 10   ? PT Start Time [phone]   pt late  ? PT Stop Time 0930   ? PT Time Calculation (min) 36 min   ? Activity Tolerance Patient tolerated treatment well   ? Behavior During Therapy [facility] for tasks assessed/performed   ? ?  ?  ? ?  ? ? ? ? ? ?Past Medical History:  ?Diagnosis Date  ? B12 deficiency anemia   ? BPPV (benign paroxysmal positional vertigo)   ? Depression   ? GAD (generalized anxiety disorder)   ? History of Graves' disease 03/2006  ? s/p RAI  ? Hypertension   ? followed by pcp  ? Hypothyroidism, postablative 2007  ? endocrinologist---- dr Cruzita Lederer;   hx grave's s/p RAI 12/ 2007  ? Insomnia   ? Migraine   ? Peripheral neuropathy   ? Right ovarian cyst   ? ?Past Surgical History:  ?Procedure Laterality Date  ? COLONOSCOPY WITH PROPOFOL  04/2021  ? LAPAROSCOPIC BILATERAL SALPINGO OOPHERECTOMY Bilateral 07/03/2021  ? Procedure: LAPAROSCOPIC BILATERAL SALPINGO OOPHORECTOMY; PELVIC WASHINGS;  Surgeon: Armandina Stammer, DO;  Location: [place];  Service: Gynecology;  Laterality: Bilateral;  ? SHOULDER ARTHROSCOPY WITH SUBACROMIAL DECOMPRESSION Right 04/11/2020  ? Procedure: SHOULDER ARTHROSCOPY WITH SUBACROMIAL DECOMPRESSION;  Surgeon: Tania Ade, MD;  Location: [place];  Service: Orthopedics;  Laterality: Right;  ? ?Patient Active Problem List  ? Diagnosis Date Noted  ? Lumbar radiculopathy, right 02/27/2021  ? Posttraumatic headache 11/24/2020  ? Insomnia 06/21/2020  ? Major depression, single episode 06/21/2020  ? Migraine 06/21/2020  ? Posttraumatic stress disorder 06/21/2020  ? Anxiety  06/21/2020  ? Allergic rhinitis 06/21/2020  ? Hyperacusis of both ears 05/17/2020  ? Laryngopharyngeal reflux (LPR) 05/17/2020  ? Right rotator cuff tear 03/17/2020  ? Excessive physiologic tremor 02/11/2015  ? 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 ? ?REFERRING DIAG: M25.561,M25.562,G89.29 (ICD-10-CM) - Bilateral chronic knee pain ? ?THERAPY DIAG:  ?Muscle weakness (generalized) ? ?Chronic pain of left knee ? ?Chronic pain of right knee ? ?PERTINENT HISTORY: 07/03/21 had bilateral ovaries removed secondary to cysts, Anxiety/Depression, Migraine, R shoulder arthroscopy with subacromial decompression on 04/11/20, BPPV ? ?PRECAUTIONS: Fall ? ?SUBJECTIVE: Muscles are a little sore but Im doing better. Sorry I was late. ? ?PAIN:  ?Are you having pain? Just sore muscles ? ?PATIENT GOALS:  To be able to move more freely without having to think about it. ? ?OBJECTIVE: (objective measures completed at initial evaluation unless otherwise dated) ? ?DIAGNOSTIC FINDINGS:  ?Knee radiographs on 4/13 unremarkable ?  ?PATIENT SURVEYS:  ?FOTO 50% at initial evaluation (projected 61% by visit 11) ?  ?COGNITION: ?          Overall cognitive status: Within functional limits for tasks assessed               ?           ?SENSATION: ?WFL ?  ?MUSCLE LENGTH: ?Hamstrings: Right 70 deg; Left 70 deg ?  ?POSTURE:  ?Slight forward head, slight rounded shoulders, decreased lumbar lordosis ?  ?PALPATION: ?  Tender to palpation over left knee, minimal edema noted ?  ?LE ROM: ?Bilateral knee A/ROM 0-146 degrees ?  ?LE MMT: ?  ?MMT Right ?07/28/2021 Left ?07/28/2021  ?Hip flexion 3+ 4  ?Hip extension 3 4-  ?Hip abduction 3+ 4  ?Hip adduction      ?Hip internal rotation      ?Hip external rotation      ?Knee flexion 4 5  ?Knee extension 4 5  ?Ankle dorsiflexion      ?Ankle plantarflexion      ?Ankle inversion      ?Ankle eversion      ? (Blank rows = not tested) ?  ?UE MMT: ?B shoulder strength of 4/5 grossly throughout ?  ?LOWER EXTREMITY SPECIAL TESTS:   ?Knee special tests: Anterior drawer test: negative and Posterior drawer test: negative ?  ?FUNCTIONAL TESTS:  ?5 times sit to stand: 17.4 sec without UE use ?Timed up and go (TUG): 7.9 sec ?  ?GAIT: ?Distance walked: 100 ?Assistive device utilized: None ?Level of assistance: Complete Independence ?Comments: Pt with antalgic gait noted with decreased weight bearing through RLE ? ? VESTIBULAR ASSESSMENT: ?08/01/2021:  [name] negative bilateral sides ?08/22/2021:  Pt reports 2 second dizziness with left [name], proceeded with Epley.  Negative Dix Hallpike on right. ?  ?  ?  ?TODAY'S TREATMENT ? 08/23/21: Pt arrives for aquatic physical therapy. Treatment took place in 3.5-5.5 feet of water. Water temperature was 92 degrees F . Pt entered the pool via stairs with mild use of rails. Pt requires buoyancy of water for support and to offload joints with strengthening exercises.   ?Seated water bench with 75% submersion ?Pt performed seated LE AROM exercises 20x in all planes, concurrent review of status and pain assessment.  ?Standing in 50%- 75% depth water pt performed water walking in all 4 directions 8x. Core compressions with neck pillow 10x 3 sec hold.  ?Hip 3 ways 15x each bil holding on to side of pool gently. Underwater bicycle with noodle behind pt x 5 min. 5 min decompression hang. ? ?08/22/2021: ?UBE level 1.0 x3 min each way ?Seated with 2# ankle weights:  heel/toe raises, LAQ, marching, hip abduction scissors.  2x10 bilat ?Sit to/from stand 2x10 with hands on thighs ?Standing at barre with 2# ankle weights:  heel raises, high marching, hip abduction, hip flexion, hip extension.  BLE 2x10 each ?Epley Maneuver to treat left side x1 rep ?Trigger Point Dry-Needling  ?Treatment instructions: Expect mild to moderate muscle soreness. S/S of pneumothorax if dry needled over a lung field, and to seek immediate medical attention should they occur. Patient verbalized understanding of these instructions and  education. ?Patient Consent Given: Yes ?Education handout provided: Yes ?Muscles treated: bilat upper trap, bilat rhomboids, right hamstring ?Treatment response/outcome: Utilized skilled palpation to identify trigger points.  Twitch response illicited and muscle elongation noted  ? ?08/16/21: Pt arrives for aquatic physical therapy. Treatment took place in 3.5-5.5 feet of water. Water temperature was 92 degrees F . Pt entered the pool via stairs with mild use of rails. Pt requires buoyancy of water for support and to offload joints with strengthening exercises.   ?Seated water bench with 75% submersion ?Pt performed seated LE AROM exercises 20x in all planes, concurrent review of status and pain assessment.  ?Standing in 50%- 75% depth water pt performed water walking in all 4 directions 6x. ?Hip 3 ways 15x each bil holding on to side of pool gently. Underwater bicycle with noodle  behind pt x 4 min. 5 min decompression hang. ? ?08/15/2021: ?UBE level 1.0 x3 min each way ?Seated with 2# ankle weights:  LAQ, marching, hip abduction scissors.  2x10 bilat ?Sit to/from stand 2x10 with hands on thighs ?Standing at barre with 2# ankle weights:  heel raises, high marching, hip abduction, hip flexion, hip extension.  BLE 2x10 each ?Standing rows and shoulder extension with red tband 2x10 bilat ?  ?  ?PATIENT EDUCATION:  ?Education details: Issued HEP ?Person educated: Patient ?Education method: Explanation and Handouts ?Education comprehension: verbalized understanding ?  ?  ?HOME EXERCISE PROGRAM: ?Access Code: [place] ?URL: [email or web address] ?Date: 07/28/2021 ?Prepared by: Juel Burrow ?  ?Exercises ?- Seated Scapular Retraction  - 1 x daily - 7 x weekly - 2 sets - 10 reps ?- Supine Bridge  - 1 x daily - 7 x weekly - 2 sets - 10 reps ?- Supine Active Straight Leg Raise  - 1 x daily - 7 x weekly - 2 sets - 10 reps ?- Sit to Stand with Arms Crossed  - 1 x daily - 7 x weekly - 2 sets - 10 reps ?- Seated Long  Arc Quad with Hip Adduction  - 1 x daily - 7 x weekly - 2 sets - 10 reps ?  ?ASSESSMENT: ?  ?CLINICAL IMPRESSION:  ?Pt  arrives with some sore muscles but essentially no pain. Small increases made to todays

## 2021-08-29 ENCOUNTER — Other Ambulatory Visit: Payer: Self-pay | Admitting: Internal Medicine

## 2021-08-29 ENCOUNTER — Encounter: Payer: Medicare Other | Admitting: Rehabilitative and Restorative Service Providers"

## 2021-09-01 ENCOUNTER — Ambulatory Visit: Payer: Medicare Other | Admitting: Physical Therapy

## 2021-09-01 ENCOUNTER — Encounter: Payer: Self-pay | Admitting: Physical Therapy

## 2021-09-01 DIAGNOSIS — M6281 Muscle weakness (generalized): Secondary | ICD-10-CM

## 2021-09-01 DIAGNOSIS — G8929 Other chronic pain: Secondary | ICD-10-CM

## 2021-09-01 NOTE — Therapy (Signed)
OUTPATIENT PHYSICAL THERAPY TREATMENT NOTE   Patient Name: Kristen [name] MRN: [id number] DOB:1961/03/06, 61 y.o., adult Today's Date: 09/01/2021  PCP: Percell Belt, DO REFERRING PROVIDER: Dr Sherene Sires  END OF SESSION:   PT End of Session - 09/01/21 1250     Visit Number 8    Date for PT Re-Evaluation 09/22/21    Authorization Type MCR    Progress Note Due on Visit 10    PT Start Time 1250    PT Stop Time 1340    PT Time Calculation (min) 50 min    Activity Tolerance Patient tolerated treatment well    Behavior During Therapy [facility] for tasks assessed/performed                 Past Medical History:  Diagnosis Date   B12 deficiency anemia    BPPV (benign paroxysmal positional vertigo)    Depression    GAD (generalized anxiety disorder)    History of Graves' disease 03/2006   s/p RAI   Hypertension    followed by pcp   Hypothyroidism, postablative 2007   endocrinologist---- dr Cruzita Lederer;   hx grave's s/p RAI 12/ 2007   Insomnia    Migraine    Peripheral neuropathy    Right ovarian cyst    Past Surgical History:  Procedure Laterality Date   COLONOSCOPY WITH PROPOFOL  04/2021   LAPAROSCOPIC BILATERAL SALPINGO OOPHERECTOMY Bilateral 07/03/2021   Procedure: LAPAROSCOPIC BILATERAL SALPINGO OOPHORECTOMY; PELVIC WASHINGS;  Surgeon: Armandina Stammer, DO;  Location: [place];  Service: Gynecology;  Laterality: Bilateral;   SHOULDER ARTHROSCOPY WITH SUBACROMIAL DECOMPRESSION Right 04/11/2020   Procedure: SHOULDER ARTHROSCOPY WITH SUBACROMIAL DECOMPRESSION;  Surgeon: Tania Ade, MD;  Location: [place];  Service: Orthopedics;  Laterality: Right;   Patient Active Problem List   Diagnosis Date Noted   Lumbar radiculopathy, right 02/27/2021   Posttraumatic headache 11/24/2020   Insomnia 06/21/2020   Major depression, single episode 06/21/2020   Migraine 06/21/2020   Posttraumatic stress disorder 06/21/2020   Anxiety 06/21/2020    Allergic rhinitis 06/21/2020   Hyperacusis of both ears 05/17/2020   Laryngopharyngeal reflux (LPR) 05/17/2020   Right rotator cuff tear 03/17/2020   Excessive physiologic tremor 02/11/2015   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 I am better now.   Are you having pain? No  PATIENT GOALS:  To be able to move more freely without having to think about it.  OBJECTIVE: (objective measures completed at initial evaluation unless otherwise dated)  DIAGNOSTIC FINDINGS:  Knee radiographs on 4/13 unremarkable   PATIENT SURVEYS:  FOTO 50% at initial evaluation (projected 61% by visit 11)   COGNITION:           Overall cognitive status: Within functional limits for tasks assessed                          SENSATION: WFL   MUSCLE LENGTH: Hamstrings: Right 70 deg; Left 70 deg   POSTURE:  Slight forward head, slight rounded shoulders, decreased lumbar lordosis   PALPATION: Tender to palpation  over left knee, minimal edema noted   LE ROM: Bilateral knee A/ROM 0-146 degrees   LE MMT:   MMT Right 07/28/2021 Left 07/28/2021  Hip flexion 3+ 4  Hip extension 3 4-  Hip abduction 3+ 4  Hip adduction      Hip internal rotation      Hip external rotation      Knee flexion 4 5  Knee extension 4 5  Ankle dorsiflexion      Ankle plantarflexion      Ankle inversion      Ankle eversion       (Blank rows = not tested)   UE MMT: B shoulder strength of 4/5 grossly throughout   LOWER EXTREMITY SPECIAL TESTS:  Knee special tests: Anterior  drawer test: negative and Posterior drawer test: negative   FUNCTIONAL TESTS:  5 times sit to stand: 17.4 sec without UE use Timed up and go (TUG): 7.9 sec   GAIT: Distance walked: 100 Assistive device utilized: None Level of assistance: Complete Independence Comments: Pt with antalgic gait noted with decreased weight bearing through RLE   VESTIBULAR ASSESSMENT: 08/01/2021:  [name] negative bilateral sides 08/22/2021:  Pt reports 2 second dizziness with left [name], proceeded with Epley.  Negative Dix Hallpike on right.       TODAY'S TREATMENT    09/01/21:Pt arrives for aquatic physical therapy. Treatment took place in 3.5-5.5 feet of water. Water temperature was 91 degrees F . Pt entered the pool via stairs with mild use of rails. Pt requires buoyancy of water for support and to offload joints with strengthening exercises.   Seated water bench with 75% submersion Pt performed seated LE AROM exercises 20x in all planes, concurrent review of status and pain assessment. Added ankle weights for LAQ 10x Bil. Standing in 50%- 75% depth water pt performed water walking in all 4 directions 10x. VC for speed in order to generate appropriate current for resistance and/or UE movements. Mitten hands added for side stepping.  Hip 3 ways 20x Bil still requires UE support for balance.High knee marching across pool 4 lengths holding onto blue noodle. Yellow noodle behind: Hip abd/add 2x10, then 2x5 min underwater bicycle.     08/23/21: Pt arrives for aquatic physical therapy. Treatment took place in 3.5-5.5 feet of water. Water temperature was 92 degrees F . Pt entered the pool via stairs with mild use of rails. Pt requires buoyancy of water for support and to offload joints with strengthening exercises.   Seated water bench with 75% submersion Pt performed seated LE AROM exercises 20x in all planes, concurrent review of status and pain assessment.  Standing in 50%- 75% depth water pt performed  water walking in all 4 directions 8x. Core compressions with neck pillow 10x 3 sec hold.  Hip 3 ways 15x each bil holding on to side of pool gently. Underwater bicycle with noodle behind pt x 5 min. 5 min decompression hang.  08/22/2021: UBE level 1.0 x3 min each way Seated with 2# ankle weights:  heel/toe raises, LAQ, marching, hip abduction scissors.  2x10 bilat Sit to/from stand 2x10 with hands on thighs Standing at barre with 2# ankle weights:  heel raises, high marching, hip abduction, hip flexion, hip extension.  BLE 2x10 each Epley Maneuver to treat left side x1 rep Trigger Point Dry-Needling  Treatment instructions: Expect mild to moderate muscle soreness. S/S of pneumothorax if dry needled over a lung field, and to seek immediate medical attention should they occur.  Patient verbalized understanding of these instructions and education. Patient Consent Given: Yes Education handout provided: Yes Muscles treated: bilat upper trap, bilat rhomboids, right hamstring Treatment response/outcome: Utilized skilled palpation to identify trigger points.  Twitch response illicited and muscle elongation noted   08/16/21: Pt arrives for aquatic physical therapy. Treatment took place in 3.5-5.5 feet of water. Water temperature was 92 degrees F . Pt entered the pool via stairs with mild use of rails. Pt requires buoyancy of water for support and to offload joints with strengthening exercises.   Seated water bench with 75% submersion Pt performed seated LE AROM exercises 20x in all planes, concurrent review of status and pain assessment.  Standing in 50%- 75% depth water pt performed water walking in all 4 directions 6x. Hip 3 ways 15x each bil holding on to side of pool gently. Underwater bicycle with noodle behind pt x 4 min. 5 min decompression hang.  08/15/2021: UBE level 1.0 x3 min each way Seated with 2# ankle weights:  LAQ, marching, hip abduction scissors.  2x10 bilat Sit to/from stand 2x10 with  hands on thighs Standing at barre with 2# ankle weights:  heel raises, high marching, hip abduction, hip flexion, hip extension.  BLE 2x10 each Standing rows and shoulder extension with red tband 2x10 bilat     PATIENT EDUCATION:  Education details: Issued HEP Person educated: Patient Education method: Explanation and Handouts Education comprehension: verbalized understanding     HOME EXERCISE PROGRAM: Access Code: [email or web address] URL: [email or web address] Date: 07/28/2021 Prepared by: Shelby Dubin Menke   Exercises - Seated Scapular Retraction  - 1 x daily - 7 x weekly - 2 sets - 10 reps - Supine Bridge  - 1 x daily - 7 x weekly - 2 sets - 10 reps - Supine Active Straight Leg Raise  - 1 x daily - 7 x weekly - 2 sets - 10 reps - Sit to Stand with Arms Crossed  - 1 x daily - 7 x weekly - 2 sets - 10 reps - Seated Long Arc Quad with Hip Adduction  - 1 x daily - 7 x weekly - 2 sets - 10 reps   ASSESSMENT:   CLINICAL IMPRESSION:  Pt arrives with no knee pain. Pt reports both knee pain and swelling are improving greatly. Pt able to tolerate longer bouts of exercise in pool today with no fatigue or complaints of pain.    OBJECTIVE IMPAIRMENTS decreased balance, difficulty walking, decreased strength, dizziness, increased muscle spasms, impaired UE functional use, postural dysfunction, and pain.    ACTIVITY LIMITATIONS cleaning, community activity, meal prep, and yard work.    PERSONAL FACTORS 3+ comorbidities: BPPV, R shoulder arthroscopy, Migraine  are also affecting patient's functional outcome.      REHAB POTENTIAL: Good   CLINICAL DECISION MAKING: Evolving/moderate complexity   EVALUATION COMPLEXITY: Moderate     GOALS: Goals reviewed with patient? Yes   SHORT TERM GOALS: Target date: 08/18/2021   Pt will be independent with initial HEP. Baseline: Goal status: ONGOING   2.  Pt will report a 30% improvement in symptoms. Baseline:  Goal status: ONGOING     LONG  TERM GOALS: Target date: 09/22/2021   Pt will be independent with advanced HEP. Baseline:  Goal status: INITIAL   2.  Pt's knee FOTO will increase to at least 61% to demonstrate improvements in functional mobility. Baseline: 50% Goal status: INITIAL   3.  Pt will increase BUE and BLE strength to at least 4+/5 throughout to  allow her to perform functional tasks in her home. Baseline:  Goal status: INITIAL   4.  Pt will report being able to reach into overhead cabinets to retrieve objects without increased pain/soreness. Baseline:  Goal status: INITIAL   5.  Pt will report at least a 75% improvement in dizziness. Baseline:  Goal status: INITIAL     PLAN: PT FREQUENCY: 2x/week   PT DURATION: 8 weeks   PLANNED INTERVENTIONS: Therapeutic exercises, Therapeutic activity, Neuromuscular re-education, Balance training, Gait training, Patient/Family education, Joint mobilization, Stair training, Vestibular training, Canalith repositioning, Aquatic Therapy, Dry Needling, Electrical stimulation, Spinal manipulation, Spinal mobilization, Cryotherapy, Moist heat, Taping, Vasopneumatic device, Ultrasound, Ionotophoresis '4mg'$ /ml Dexamethasone, and Manual therapy   PLAN FOR NEXT SESSION: Strengthening, core stability/postural reeducation, balance, aquatic therapy as indicated    [place], PTA 09/01/2021, 1:42 PM  [facility] [address], [address] [place], [place] [address] Phone # [phone] Fax [phone]

## 2021-09-05 ENCOUNTER — Encounter: Payer: Medicare Other | Admitting: Rehabilitative and Restorative Service Providers"

## 2021-09-05 ENCOUNTER — Encounter: Payer: Self-pay | Admitting: Rehabilitative and Restorative Service Providers"

## 2021-09-05 ENCOUNTER — Ambulatory Visit: Payer: Medicare Other | Admitting: Rehabilitative and Restorative Service Providers"

## 2021-09-05 DIAGNOSIS — M542 Cervicalgia: Secondary | ICD-10-CM

## 2021-09-05 DIAGNOSIS — R252 Cramp and spasm: Secondary | ICD-10-CM

## 2021-09-05 DIAGNOSIS — R2681 Unsteadiness on feet: Secondary | ICD-10-CM

## 2021-09-05 DIAGNOSIS — R42 Dizziness and giddiness: Secondary | ICD-10-CM

## 2021-09-05 DIAGNOSIS — M25562 Pain in left knee: Secondary | ICD-10-CM

## 2021-09-05 DIAGNOSIS — R2689 Other abnormalities of gait and mobility: Secondary | ICD-10-CM

## 2021-09-05 DIAGNOSIS — M6281 Muscle weakness (generalized): Secondary | ICD-10-CM | POA: Diagnosis not present

## 2021-09-05 DIAGNOSIS — G8929 Other chronic pain: Secondary | ICD-10-CM

## 2021-09-05 NOTE — Therapy (Signed)
OUTPATIENT PHYSICAL THERAPY TREATMENT NOTE   Patient Name: Kristen [name] MRN: [id number] DOB:1960-12-13, 61 y.o., adult Today's Date: 09/05/2021  PCP: Percell Belt, DO REFERRING PROVIDER: Dr Sherene Sires  Progress Note Reporting Period 07/28/2021 to 09/05/2021  See note below for Objective Data and Assessment of Progress/Goals.      END OF SESSION:   PT End of Session - 09/05/21 1155     Visit Number 9    Date for PT Re-Evaluation 09/22/21    Authorization Type MCR    Progress Note Due on Visit 19    PT Start Time 1150    PT Stop Time 1230    PT Time Calculation (min) 40 min    Activity Tolerance Patient tolerated treatment well    Behavior During Therapy [facility] for tasks assessed/performed                 Past Medical History:  Diagnosis Date   B12 deficiency anemia    BPPV (benign paroxysmal positional vertigo)    Depression    GAD (generalized anxiety disorder)    History of Graves' disease 03/2006   s/p RAI   Hypertension    followed by pcp   Hypothyroidism, postablative 2007   endocrinologist---- dr Cruzita Lederer;   hx grave's s/p RAI 12/ 2007   Insomnia    Migraine    Peripheral neuropathy    Right ovarian cyst    Past Surgical History:  Procedure Laterality Date   COLONOSCOPY WITH PROPOFOL  04/2021   LAPAROSCOPIC BILATERAL SALPINGO OOPHERECTOMY Bilateral 07/03/2021   Procedure: LAPAROSCOPIC BILATERAL SALPINGO OOPHORECTOMY; PELVIC WASHINGS;  Surgeon: Armandina Stammer, DO;  Location: [place];  Service: Gynecology;  Laterality: Bilateral;   SHOULDER ARTHROSCOPY WITH SUBACROMIAL DECOMPRESSION Right 04/11/2020   Procedure: SHOULDER ARTHROSCOPY WITH SUBACROMIAL DECOMPRESSION;  Surgeon: Tania Ade, MD;  Location: [place];  Service: Orthopedics;  Laterality: Right;   Patient Active Problem List   Diagnosis Date Noted   Lumbar radiculopathy, right 02/27/2021   Posttraumatic headache 11/24/2020   Insomnia  06/21/2020   Major depression, single episode 06/21/2020   Migraine 06/21/2020   Posttraumatic stress disorder 06/21/2020   Anxiety 06/21/2020   Allergic rhinitis 06/21/2020   Hyperacusis of both ears 05/17/2020   Laryngopharyngeal reflux (LPR) 05/17/2020   Right rotator cuff tear 03/17/2020   Excessive physiologic tremor 02/11/2015   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/14/2014    REFERRING DIAG: M25.561,M25.562,G89.29 (ICD-10-CM) - Bilateral chronic knee pain  THERAPY DIAG:  Muscle weakness (generalized)  Chronic pain of left knee  Chronic pain of right knee  Balance problem  Unsteadiness on feet  Dizziness and giddiness  Cramp and spasm  Cervicalgia  PERTINENT HISTORY: 07/03/21 had bilateral ovaries removed secondary to cysts, Anxiety/Depression, Migraine, R shoulder arthroscopy with subacromial decompression on 04/11/20, BPPV  PRECAUTIONS: Fall  SUBJECTIVE: Pt reports overall feeling 60% better since starting PT.  States that she was able to go shopping this weekend.  Are you having pain? No  PATIENT GOALS:  To be able to move more freely without having to think about it.  OBJECTIVE: (objective measures completed at initial evaluation unless otherwise dated)  DIAGNOSTIC FINDINGS:  Knee radiographs on 4/13 unremarkable   PATIENT SURVEYS:  FOTO 50% at initial evaluation (projected 61% by visit 11)   COGNITION:           Overall cognitive status: Within functional limits for tasks assessed  SENSATION: WFL   MUSCLE LENGTH: Hamstrings: Right 70 deg; Left 70 deg   POSTURE:  Slight forward head, slight rounded shoulders, decreased lumbar lordosis   PALPATION: Tender to palpation over left knee, minimal edema noted   LE ROM: Bilateral knee A/ROM 0-146 degrees   LE MMT:   MMT Right 07/28/2021 Right 09/05/2021 Left 07/28/2021  Hip flexion 3+ 4 4  Hip extension 3 3+ 4-  Hip abduction 3+ 4 4  Hip adduction       Hip internal rotation        Hip external rotation       Knee flexion 4  5  Knee extension 4  5  Ankle dorsiflexion       Ankle plantarflexion       Ankle inversion       Ankle eversion        (Blank rows = not tested)   UE MMT: B shoulder strength of 4/5 grossly throughout   LOWER EXTREMITY SPECIAL TESTS:  Knee special tests: Anterior drawer test: negative and Posterior drawer test: negative   FUNCTIONAL TESTS:  5 times sit to stand: 17.4 sec without UE use Timed up and go (TUG): 7.9 sec   GAIT: Distance walked: 100 Assistive device utilized: None Level of assistance: Complete Independence Comments: Pt with antalgic gait noted with decreased weight bearing through RLE   VESTIBULAR ASSESSMENT: 08/01/2021:  [name] negative bilateral sides 08/22/2021:  Pt reports 2 second dizziness with left [name], proceeded with Epley.  Negative Dix Hallpike on right.       TODAY'S TREATMENT  09/05/2021: Seated with 2# ankle weights:  heel/toe raises, LAQ, marching, hip abduction scissors.  2x10 bilat Sit to/from stand holding 5# kettlebell 2x10 Nustep level 5 x6 minutes (523 steps) with PT present to discuss status Standing at barre with 2# ankle weights:  heel raises, high marching, hip abduction, hip flexion, hip extension.  BLE 2x10 each Side stepping with green loop 3 x 10 ft bilat Fwd/backwards gait  Fwd step ups to 6" step without UE use x10 bilat  09/01/21:Pt arrives for aquatic physical therapy. Treatment took place in 3.5-5.5 feet of water. Water temperature was 91 degrees F . Pt entered the pool via stairs with mild use of rails. Pt requires buoyancy of water for support and to offload joints with strengthening exercises.   Seated water bench with 75% submersion Pt performed seated LE AROM exercises 20x in all planes, concurrent review of status and pain assessment. Added ankle weights for LAQ 10x Bil. Standing in 50%- 75% depth water pt performed water walking in all 4 directions 10x. VC for  speed in order to generate appropriate current for resistance and/or UE movements. Mitten hands added for side stepping.  Hip 3 ways 20x Bil still requires UE support for balance.High knee marching across pool 4 lengths holding onto blue noodle. Yellow noodle behind: Hip abd/add 2x10, then 2x5 min underwater bicycle.     08/23/21: Pt arrives for aquatic physical therapy. Treatment took place in 3.5-5.5 feet of water. Water temperature was 92 degrees F . Pt entered the pool via stairs with mild use of rails. Pt requires buoyancy of water for support and to offload joints with strengthening exercises.   Seated water bench with 75% submersion Pt performed seated LE AROM exercises 20x in all planes, concurrent review of status and pain assessment.  Standing in 50%- 75% depth water pt performed water walking in all 4 directions 8x. Core compressions  with neck pillow 10x 3 sec hold.  Hip 3 ways 15x each bil holding on to side of pool gently. Underwater bicycle with noodle behind pt x 5 min. 5 min decompression hang.  08/22/2021: UBE level 1.0 x3 min each way Seated with 2# ankle weights:  heel/toe raises, LAQ, marching, hip abduction scissors.  2x10 bilat Sit to/from stand 2x10 with hands on thighs Standing at barre with 2# ankle weights:  heel raises, high marching, hip abduction, hip flexion, hip extension.  BLE 2x10 each Epley Maneuver to treat left side x1 rep Trigger Point Dry-Needling  Treatment instructions: Expect mild to moderate muscle soreness. S/S of pneumothorax if dry needled over a lung field, and to seek immediate medical attention should they occur. Patient verbalized understanding of these instructions and education. Patient Consent Given: Yes Education handout provided: Yes Muscles treated: bilat upper trap, bilat rhomboids, right hamstring Treatment response/outcome: Utilized skilled palpation to identify trigger points.  Twitch response illicited and muscle elongation noted        PATIENT EDUCATION:  Education details: Issued HEP Person educated: Patient Education method: Theatre stage manager Education comprehension: verbalized understanding     HOME EXERCISE PROGRAM: Access Code: [email or web address] URL: [email or web address] Date: 07/28/2021 Prepared by: Shelby Dubin Rishaan Gunner   Exercises - Seated Scapular Retraction  - 1 x daily - 7 x weekly - 2 sets - 10 reps - Supine Bridge  - 1 x daily - 7 x weekly - 2 sets - 10 reps - Supine Active Straight Leg Raise  - 1 x daily - 7 x weekly - 2 sets - 10 reps - Sit to Stand with Arms Crossed  - 1 x daily - 7 x weekly - 2 sets - 10 reps - Seated Long Arc Quad with Hip Adduction  - 1 x daily - 7 x weekly - 2 sets - 10 reps   ASSESSMENT:   CLINICAL IMPRESSION:  Pt arrives for skilled rehabilitation reporting overall 60% feeling better since starting PT. Pt is improving with LE strength and denies pain currently, just reports her biggest deficit continues to be weakness.  Pt was able to perform step up exercise without UE use, but did have some difficulty with side step up exercise.  Pt continues to require skilled PT to progress towards goal related activities and increased safety.   OBJECTIVE IMPAIRMENTS decreased balance, difficulty walking, decreased strength, dizziness, increased muscle spasms, impaired UE functional use, postural dysfunction, and pain.    ACTIVITY LIMITATIONS cleaning, community activity, meal prep, and yard work.    PERSONAL FACTORS 3+ comorbidities: BPPV, R shoulder arthroscopy, Migraine  are also affecting patient's functional outcome.      REHAB POTENTIAL: Good   CLINICAL DECISION MAKING: Evolving/moderate complexity   EVALUATION COMPLEXITY: Moderate     GOALS: Goals reviewed with patient? Yes   SHORT TERM GOALS: Target date: 08/18/2021   Pt will be independent with initial HEP. Baseline: Goal status: Goal Met   2.  Pt will report a 30% improvement in symptoms. Baseline:  Goal  status: Goal Met 09/05/2021     LONG TERM GOALS: Target date: 09/22/2021   Pt will be independent with advanced HEP. Baseline:  Goal status: Ongoing   2.  Pt's knee FOTO will increase to at least 61% to demonstrate improvements in functional mobility. Baseline: 50% Goal status: INITIAL   3.  Pt will increase BUE and BLE strength to at least 4+/5 throughout to allow her to perform functional tasks in her home. Baseline:  Goal status: Ongoing (see above)   4.  Pt will report being able to reach into overhead cabinets to retrieve objects without increased pain/soreness. Baseline:  Goal status: Ongoing   5.  Pt will report at least a 75% improvement in dizziness. Baseline:  Goal status: Ongoing (denies dizziness on 09/05/2021)     PLAN: PT FREQUENCY: 2x/week   PT DURATION: 8 weeks   PLANNED INTERVENTIONS: Therapeutic exercises, Therapeutic activity, Neuromuscular re-education, Balance training, Gait training, Patient/Family education, Joint mobilization, Stair training, Vestibular training, Canalith repositioning, Aquatic Therapy, Dry Needling, Electrical stimulation, Spinal manipulation, Spinal mobilization, Cryotherapy, Moist heat, Taping, Vasopneumatic device, Ultrasound, Ionotophoresis 77m/ml Dexamethasone, and Manual therapy   PLAN FOR NEXT SESSION: Strengthening, core stability/postural reeducation, balance, aquatic therapy as indicated    SJuel Burrow PT 09/05/2021, 1:21 PM  [facility] [place] [place] [place] [address]Phone # [phone] [phone]

## 2021-09-08 ENCOUNTER — Encounter: Payer: Self-pay | Admitting: Physical Therapy

## 2021-09-08 ENCOUNTER — Ambulatory Visit: Payer: Medicare Other | Attending: Family Medicine | Admitting: Physical Therapy

## 2021-09-08 DIAGNOSIS — M25561 Pain in right knee: Secondary | ICD-10-CM | POA: Diagnosis present

## 2021-09-08 DIAGNOSIS — R42 Dizziness and giddiness: Secondary | ICD-10-CM | POA: Insufficient documentation

## 2021-09-08 DIAGNOSIS — R2681 Unsteadiness on feet: Secondary | ICD-10-CM | POA: Insufficient documentation

## 2021-09-08 DIAGNOSIS — M6281 Muscle weakness (generalized): Secondary | ICD-10-CM | POA: Diagnosis present

## 2021-09-08 DIAGNOSIS — M542 Cervicalgia: Secondary | ICD-10-CM | POA: Diagnosis present

## 2021-09-08 DIAGNOSIS — M25562 Pain in left knee: Secondary | ICD-10-CM | POA: Diagnosis present

## 2021-09-08 DIAGNOSIS — G8929 Other chronic pain: Secondary | ICD-10-CM | POA: Diagnosis present

## 2021-09-08 DIAGNOSIS — R252 Cramp and spasm: Secondary | ICD-10-CM | POA: Insufficient documentation

## 2021-09-08 DIAGNOSIS — R2689 Other abnormalities of gait and mobility: Secondary | ICD-10-CM | POA: Insufficient documentation

## 2021-09-08 NOTE — Therapy (Signed)
OUTPATIENT PHYSICAL THERAPY TREATMENT NOTE   Patient Name: Kristen [name] MRN: [id number] DOB:01-11-1961, 61 y.o., adult Today's Date: 09/08/2021  PCP: Percell Belt, DO REFERRING PROVIDER: Dr Sherene Sires  Progress Note Reporting Period 07/28/2021 to 09/05/2021  See note below for Objective Data and Assessment of Progress/Goals.      END OF SESSION:   PT End of Session - 09/08/21 1519     Visit Number 10    Date for PT Re-Evaluation 09/22/21    Authorization Type MCR    Progress Note Due on Visit 19    PT Start Time 1345    PT Stop Time 1430    PT Time Calculation (min) 45 min    Activity Tolerance Patient tolerated treatment well    Behavior During Therapy [facility] for tasks assessed/performed                  Past Medical History:  Diagnosis Date   B12 deficiency anemia    BPPV (benign paroxysmal positional vertigo)    Depression    GAD (generalized anxiety disorder)    History of Graves' disease 03/2006   s/p RAI   Hypertension    followed by pcp   Hypothyroidism, postablative 2007   endocrinologist---- dr Cruzita Lederer;   hx grave's s/p RAI 12/ 2007   Insomnia    Migraine    Peripheral neuropathy    Right ovarian cyst    Past Surgical History:  Procedure Laterality Date   COLONOSCOPY WITH PROPOFOL  04/2021   LAPAROSCOPIC BILATERAL SALPINGO OOPHERECTOMY Bilateral 07/03/2021   Procedure: LAPAROSCOPIC BILATERAL SALPINGO OOPHORECTOMY; PELVIC WASHINGS;  Surgeon: Armandina Stammer, DO;  Location: [place];  Service: Gynecology;  Laterality: Bilateral;   SHOULDER ARTHROSCOPY WITH SUBACROMIAL DECOMPRESSION Right 04/11/2020   Procedure: SHOULDER ARTHROSCOPY WITH SUBACROMIAL DECOMPRESSION;  Surgeon: Tania Ade, MD;  Location: [place];  Service: Orthopedics;  Laterality: Right;   Patient Active Problem List   Diagnosis Date Noted   Lumbar radiculopathy, right 02/27/2021   Posttraumatic headache 11/24/2020   Insomnia  06/21/2020   Major depression, single episode 06/21/2020   Migraine 06/21/2020   Posttraumatic stress disorder 06/21/2020   Anxiety 06/21/2020   Allergic rhinitis 06/21/2020   Hyperacusis of both ears 05/17/2020   Laryngopharyngeal reflux (LPR) 05/17/2020   Right rotator cuff tear 03/17/2020   Excessive physiologic tremor 02/11/2015   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 Are you having pain? No  PATIENT GOALS:  To be able to move more freely without having to think about it.  OBJECTIVE: (objective measures completed at initial evaluation unless otherwise dated)  DIAGNOSTIC FINDINGS:  Knee radiographs on 4/13 unremarkable   PATIENT SURVEYS:  FOTO 50% at initial evaluation (projected 61% by visit 11)   COGNITION:           Overall cognitive status: Within functional limits for tasks assessed                          SENSATION: WFL   MUSCLE LENGTH: Hamstrings: Right 70 deg; Left 70 deg   POSTURE:  Slight forward head, slight rounded shoulders, decreased lumbar lordosis   PALPATION: Tender to palpation over left knee, minimal edema noted   LE ROM: Bilateral knee A/ROM 0-146 degrees   LE MMT:   MMT Right 07/28/2021 Right 09/05/2021 Left 07/28/2021  Hip flexion 3+ 4 4  Hip extension 3 3+ 4-  Hip abduction 3+ 4 4  Hip adduction       Hip internal rotation       Hip external rotation       Knee flexion 4  5  Knee extension 4  5  Ankle dorsiflexion       Ankle plantarflexion       Ankle inversion       Ankle eversion         (Blank rows = not tested)   UE MMT: B shoulder strength of 4/5 grossly throughout   LOWER EXTREMITY SPECIAL TESTS:  Knee special tests: Anterior drawer test: negative and Posterior drawer test: negative   FUNCTIONAL TESTS:  5 times sit to stand: 17.4 sec without UE use Timed up and go (TUG): 7.9 sec   GAIT: Distance walked: 100 Assistive device utilized: None Level of assistance: Complete Independence Comments: Pt with antalgic gait noted with decreased weight bearing through RLE   VESTIBULAR ASSESSMENT: 08/01/2021:  [name] negative bilateral sides 08/22/2021:  Pt reports 2 second dizziness with left [name], proceeded with Epley.  Negative Dix Hallpike on right.       TODAY'S TREATMENT   09/08/21: Pt arrives for aquatic physical therapy. Treatment took place in 3.5-5.5 feet of water. Water temperature was 91 degrees F . Pt entered the pool via stairs with mild use of rails. Pt requires buoyancy of water for support and to offload joints with strengthening exercises.   Seated water bench with 75% submersion Pt performed seated LE AROM exercises 20x in all planes, concurrent review of status and pain assessment. Added ankle weights for LAQ 10x Bil. Standing in 50%- 75% depth water pt performed water walking in all 4 directions 10x. VC for speed in order to generate appropriate current for resistance and/or UE movements. Mitten hands added for side stepping.  Hip 3 ways 20x Bil still requires UE support for balance.High knee marching across pool 4 lengths holding onto blue noodle. Yellow noodle behind: Hip abd/add 2x10, then 8 min underwater bicycle.  Small noodle knee extensions 10x Bil.  09/05/2021: Seated with 2# ankle weights:  heel/toe raises, LAQ, marching, hip abduction scissors.  2x10 bilat Sit to/from stand holding 5# kettlebell 2x10 Nustep level 5 x6 minutes (523 steps) with PT present to discuss status Standing at barre with 2# ankle weights:  heel raises, high  marching, hip abduction, hip flexion, hip extension.  BLE 2x10 each Side stepping with green loop 3 x 10 ft bilat Fwd/backwards gait  Fwd step ups to 6" step without UE use x10 bilat  09/01/21:Pt arrives for aquatic physical therapy. Treatment took place in 3.5-5.5 feet of water. Water temperature was 91 degrees F . Pt entered the pool via stairs with mild use of rails. Pt requires buoyancy of water for support and to offload joints with strengthening exercises.   Seated water bench with 75% submersion Pt performed seated LE AROM exercises 20x in all planes, concurrent review of status and pain assessment. Added ankle weights for LAQ 10x Bil. Standing in 50%- 75% depth water pt performed water walking in all 4 directions 10x. VC for speed in order to generate appropriate  current for resistance and/or UE movements. Mitten hands added for side stepping.  Hip 3 ways 20x Bil still requires UE support for balance.High knee marching across pool 4 lengths holding onto blue noodle. Yellow noodle behind: Hip abd/add 2x10, then 2x5 min underwater bicycle.     08/23/21: Pt arrives for aquatic physical therapy. Treatment took place in 3.5-5.5 feet of water. Water temperature was 92 degrees F . Pt entered the pool via stairs with mild use of rails. Pt requires buoyancy of water for support and to offload joints with strengthening exercises.   Seated water bench with 75% submersion Pt performed seated LE AROM exercises 20x in all planes, concurrent review of status and pain assessment.  Standing in 50%- 75% depth water pt performed water walking in all 4 directions 8x. Core compressions with neck pillow 10x 3 sec hold.  Hip 3 ways 15x each bil holding on to side of pool gently. Underwater bicycle with noodle behind pt x 5 min. 5 min decompression hang.  08/22/2021: UBE level 1.0 x3 min each way Seated with 2# ankle weights:  heel/toe raises, LAQ, marching, hip abduction scissors.  2x10 bilat Sit to/from stand  2x10 with hands on thighs Standing at barre with 2# ankle weights:  heel raises, high marching, hip abduction, hip flexion, hip extension.  BLE 2x10 each Epley Maneuver to treat left side x1 rep Trigger Point Dry-Needling  Treatment instructions: Expect mild to moderate muscle soreness. S/S of pneumothorax if dry needled over a lung field, and to seek immediate medical attention should they occur. Patient verbalized understanding of these instructions and education. Patient Consent Given: Yes Education handout provided: Yes Muscles treated: bilat upper trap, bilat rhomboids, right hamstring Treatment response/outcome: Utilized skilled palpation to identify trigger points.  Twitch response illicited and muscle elongation noted       PATIENT EDUCATION:  Education details: Issued HEP Person educated: Patient Education method: Theatre stage manager Education comprehension: verbalized understanding     HOME EXERCISE PROGRAM: Access Code: [email or web address] URL: [email or web address] Date: 07/28/2021 Prepared by: Shelby Dubin Menke   Exercises - Seated Scapular Retraction  - 1 x daily - 7 x weekly - 2 sets - 10 reps - Supine Bridge  - 1 x daily - 7 x weekly - 2 sets - 10 reps - Supine Active Straight Leg Raise  - 1 x daily - 7 x weekly - 2 sets - 10 reps - Sit to Stand with Arms Crossed  - 1 x daily - 7 x weekly - 2 sets - 10 reps - Seated Long Arc Quad with Hip Adduction  - 1 x daily - 7 x weekly - 2 sets - 10 reps   ASSESSMENT:   CLINICAL IMPRESSION:  Pt arrives to aquatic PT pain free.  Pt feels confident in adding some exercises today to target her knee strength. She had no issues and/or fatigue.   OBJECTIVE IMPAIRMENTS decreased balance, difficulty walking, decreased strength, dizziness, increased muscle spasms, impaired UE functional use, postural dysfunction, and pain.    ACTIVITY LIMITATIONS cleaning, community activity, meal prep, and yard work.    PERSONAL FACTORS 3+  comorbidities: BPPV, R shoulder arthroscopy, Migraine  are also affecting patient's functional outcome.      REHAB POTENTIAL: Good   CLINICAL DECISION MAKING: Evolving/moderate complexity   EVALUATION COMPLEXITY: Moderate     GOALS: Goals reviewed with patient? Yes   SHORT TERM GOALS: Target date: 08/18/2021   Pt will be independent with initial HEP. Baseline: Goal status: Goal  Met   2.  Pt will report a 30% improvement in symptoms. Baseline:  Goal status: Goal Met 09/05/2021     LONG TERM GOALS: Target date: 09/22/2021   Pt will be independent with advanced HEP. Baseline:  Goal status: Ongoing   2.  Pt's knee FOTO will increase to at least 61% to demonstrate improvements in functional mobility. Baseline: 50% Goal status: INITIAL   3.  Pt will increase BUE and BLE strength to at least 4+/5 throughout to allow her to perform functional tasks in her home. Baseline:  Goal status: Ongoing (see above)   4.  Pt will report being able to reach into overhead cabinets to retrieve objects without increased pain/soreness. Baseline:  Goal status: Ongoing   5.  Pt will report at least a 75% improvement in dizziness. Baseline:  Goal status: Ongoing (denies dizziness on 09/05/2021)     PLAN: PT FREQUENCY: 2x/week   PT DURATION: 8 weeks   PLANNED INTERVENTIONS: Therapeutic exercises, Therapeutic activity, Neuromuscular re-education, Balance training, Gait training, Patient/Family education, Joint mobilization, Stair training, Vestibular training, Canalith repositioning, Aquatic Therapy, Dry Needling, Electrical stimulation, Spinal manipulation, Spinal mobilization, Cryotherapy, Moist heat, Taping, Vasopneumatic device, Ultrasound, Ionotophoresis 30m/ml Dexamethasone, and Manual therapy   PLAN FOR NEXT SESSION: Strengthening, core stability/postural reeducation, balance, aquatic therapy as indicated    Kristen [name], PTA 09/08/2021, 3:20 PM  [facility] [place] [address]Phone # [phone] [phone]

## 2021-09-12 ENCOUNTER — Encounter: Payer: Medicare Other | Admitting: Rehabilitative and Restorative Service Providers"

## 2021-09-12 ENCOUNTER — Encounter: Payer: Self-pay | Admitting: Rehabilitative and Restorative Service Providers"

## 2021-09-12 ENCOUNTER — Ambulatory Visit: Payer: Medicare Other | Admitting: Rehabilitative and Restorative Service Providers"

## 2021-09-12 DIAGNOSIS — M6281 Muscle weakness (generalized): Secondary | ICD-10-CM | POA: Diagnosis not present

## 2021-09-12 DIAGNOSIS — R252 Cramp and spasm: Secondary | ICD-10-CM

## 2021-09-12 DIAGNOSIS — R2689 Other abnormalities of gait and mobility: Secondary | ICD-10-CM

## 2021-09-12 DIAGNOSIS — M542 Cervicalgia: Secondary | ICD-10-CM

## 2021-09-12 DIAGNOSIS — R42 Dizziness and giddiness: Secondary | ICD-10-CM

## 2021-09-12 DIAGNOSIS — R2681 Unsteadiness on feet: Secondary | ICD-10-CM

## 2021-09-12 DIAGNOSIS — G8929 Other chronic pain: Secondary | ICD-10-CM

## 2021-09-12 NOTE — Therapy (Signed)
OUTPATIENT PHYSICAL THERAPY REASSESSMENT NOTE   Patient Name: Kristen [name] MRN: [id number] DOB:[birth year], 61 y.o., adult Today's Date: 09/12/2021  PCP: Percell Belt, DO REFERRING PROVIDER: Dr Sherene Sires     END OF SESSION:   PT End of Session - 09/12/21 1017     Visit Number 11    Date for PT Re-Evaluation 10/20/21    Authorization Type MCR    Progress Note Due on Visit 19    PT Start Time 1016    PT Stop Time 1055    PT Time Calculation (min) 39 min    Activity Tolerance Patient tolerated treatment well    Behavior During Therapy [facility] for tasks assessed/performed                  Past Medical History:  Diagnosis Date   B12 deficiency anemia    BPPV (benign paroxysmal positional vertigo)    Depression    GAD (generalized anxiety disorder)    History of Graves' disease 03/2006   s/p RAI   Hypertension    followed by pcp   Hypothyroidism, postablative 2007   endocrinologist---- dr Cruzita Lederer;   hx grave's s/p RAI 12/ 2007   Insomnia    Migraine    Peripheral neuropathy    Right ovarian cyst    Past Surgical History:  Procedure Laterality Date   COLONOSCOPY WITH PROPOFOL  04/2021   LAPAROSCOPIC BILATERAL SALPINGO OOPHERECTOMY Bilateral 07/03/2021   Procedure: LAPAROSCOPIC BILATERAL SALPINGO OOPHORECTOMY; PELVIC WASHINGS;  Surgeon: Armandina Stammer, DO;  Location: [place];  Service: Gynecology;  Laterality: Bilateral;   SHOULDER ARTHROSCOPY WITH SUBACROMIAL DECOMPRESSION Right 04/11/2020   Procedure: SHOULDER ARTHROSCOPY WITH SUBACROMIAL DECOMPRESSION;  Surgeon: Tania Ade, MD;  Location: [place];  Service: Orthopedics;  Laterality: Right;   Patient Active Problem List   Diagnosis Date Noted   Lumbar radiculopathy, right 02/27/2021   Posttraumatic headache 11/24/2020   Insomnia 06/21/2020   Major depression, single episode 06/21/2020   Migraine 06/21/2020   Posttraumatic stress disorder 06/21/2020   Anxiety  06/21/2020   Allergic rhinitis 06/21/2020   Hyperacusis of both ears 05/17/2020   Laryngopharyngeal reflux (LPR) 05/17/2020   Right rotator cuff tear 03/17/2020   Excessive physiologic tremor 02/11/2015   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/14/2014    REFERRING DIAG: M25.561,M25.562,G89.29 (ICD-10-CM) - Bilateral chronic knee pain  THERAPY DIAG:  Muscle weakness (generalized) - Plan: PT plan of care cert/re-cert  Chronic pain of left knee - Plan: PT plan of care cert/re-cert  Chronic pain of right knee - Plan: PT plan of care cert/re-cert  Balance problem - Plan: PT plan of care cert/re-cert  Unsteadiness on feet - Plan: PT plan of care cert/re-cert  Dizziness and giddiness - Plan: PT plan of care cert/re-cert  Cramp and spasm - Plan: PT plan of care cert/re-cert  Cervicalgia - Plan: PT plan of care cert/re-cert  PERTINENT HISTORY: 07/03/21 had bilateral ovaries removed secondary to cysts, Anxiety/Depression, Migraine, R shoulder arthroscopy with subacromial decompression on 04/11/20, BPPV  PRECAUTIONS: Fall  SUBJECTIVE: Pt denies pain, just states a little bit of soreness of 2/10 in her knees. Are you having pain? No  PATIENT GOALS:  To be able to move more freely without having to think about it.  OBJECTIVE: (objective measures completed at initial evaluation unless otherwise dated)  DIAGNOSTIC FINDINGS:  Knee radiographs on 4/13 unremarkable   PATIENT SURVEYS:  09/12/2021 FOTO:  67%  FOTO 50% at initial evaluation (projected 61% by visit  11)   COGNITION:           Overall cognitive status: Within functional limits for tasks assessed                          SENSATION: WFL   MUSCLE LENGTH: Hamstrings: Right 70 deg; Left 70 deg   POSTURE:  Slight forward head, slight rounded shoulders, decreased lumbar lordosis   PALPATION: Tender to palpation over left knee, minimal edema noted   LE ROM: Bilateral knee A/ROM 0-146 degrees   LE MMT:   MMT Right 07/28/2021  Right 09/05/2021 Left 07/28/2021  Hip flexion 3+ 4 4  Hip extension 3 3+ 4-  Hip abduction 3+ 4 4  Hip adduction       Hip internal rotation       Hip external rotation       Knee flexion 4  5  Knee extension 4  5  Ankle dorsiflexion       Ankle plantarflexion       Ankle inversion       Ankle eversion        (Blank rows = not tested)   UE MMT: B shoulder strength of 4/5 grossly throughout On 09/12/2021:  B shoulder strength of 4 to 4+/5 grossly throughout   LOWER EXTREMITY SPECIAL TESTS:  Knee special tests: Anterior drawer test: negative and Posterior drawer test: negative   FUNCTIONAL TESTS:   07/28/2021: 5 times sit to stand: 17.4 sec without UE use Timed up and go (TUG): 7.9 sec   GAIT: Distance walked: 100 Assistive device utilized: None Level of assistance: Complete Independence Comments: Pt with antalgic gait noted with decreased weight bearing through RLE   VESTIBULAR ASSESSMENT: 08/01/2021:  [name] negative bilateral sides 08/22/2021:  Pt reports 2 second dizziness with left [name], proceeded with Epley.  Negative Dix Hallpike on right.       TODAY'S TREATMENT  09/12/2021: UBE level 2.0 x3 min each way Nustep with LE only level 5 x5 minutes (382 steps) with PT present to discuss status Seated with 2.5# ankle weights:  heel/toe raises, LAQ, marching, hip abduction scissors.  2x10 bilat Standing at barre with 2.5# ankle weights:  heel raises, high marching, hip abduction, hip flexion, hip extension.  BLE 2x10 each Side stepping with green loop 3 x 10 ft bilat Monster walking fwd and back with green loop 3x10 ft bilat  09/08/21: Pt arrives for aquatic physical therapy. Treatment took place in 3.5-5.5 feet of water. Water temperature was 91 degrees F . Pt entered the pool via stairs with mild use of rails. Pt requires buoyancy of water for support and to offload joints with strengthening exercises.   Seated water bench with 75% submersion Pt performed  seated LE AROM exercises 20x in all planes, concurrent review of status and pain assessment. Added ankle weights for LAQ 10x Bil. Standing in 50%- 75% depth water pt performed water walking in all 4 directions 10x. VC for speed in order to generate appropriate current for resistance and/or UE movements. Mitten hands added for side stepping.  Hip 3 ways 20x Bil still requires UE support for balance.High knee marching across pool 4 lengths holding onto blue noodle. Yellow noodle behind: Hip abd/add 2x10, then 8 min underwater bicycle.  Small noodle knee extensions 10x Bil.  09/05/2021: Seated with 2# ankle weights:  heel/toe raises, LAQ, marching, hip abduction scissors.  2x10 bilat Sit to/from stand holding 5# kettlebell  2x10 Nustep level 5 x6 minutes (523 steps) with PT present to discuss status Standing at barre with 2# ankle weights:  heel raises, high marching, hip abduction, hip flexion, hip extension.  BLE 2x10 each Side stepping with green loop 3 x 10 ft bilat Fwd/backwards gait  Fwd step ups to 6" step without UE use x10 bilat       PATIENT EDUCATION:  Education details: Issued HEP Person educated: Patient Education method: Theatre stage manager Education comprehension: verbalized understanding     HOME EXERCISE PROGRAM: Access Code: [email or web address] URL: [email or web address] Date: 07/28/2021 Prepared by: Shelby Dubin Menke   Exercises - Seated Scapular Retraction  - 1 x daily - 7 x weekly - 2 sets - 10 reps - Supine Bridge  - 1 x daily - 7 x weekly - 2 sets - 10 reps - Supine Active Straight Leg Raise  - 1 x daily - 7 x weekly - 2 sets - 10 reps - Sit to Stand with Arms Crossed  - 1 x daily - 7 x weekly - 2 sets - 10 reps - Seated Long Arc Quad with Hip Adduction  - 1 x daily - 7 x weekly - 2 sets - 10 reps   ASSESSMENT:   CLINICAL IMPRESSION:  Ms Bechler presents to skilled rehabilitation with reports of overall feeling better, but states that she is noticing main  impairment of weakness.  Pt reports that she is not getting headaches or dizziness like she was initially.  Pt has met FOTO goal at this time with a score of 67%.  Pt continues to report a decrease in pain, but does state some soreness.  Pt continues to have some difficulty with navigating steps and sit to/from stand from a low surfaces.  Pt continues to require an additional 2x/week for 4 weeks of PT from initial certification period to further progress towards goal related activities.  OBJECTIVE IMPAIRMENTS decreased balance, difficulty walking, decreased strength, dizziness, increased muscle spasms, impaired UE functional use, postural dysfunction, and pain.    ACTIVITY LIMITATIONS cleaning, community activity, meal prep, and yard work.    PERSONAL FACTORS 3+ comorbidities: BPPV, R shoulder arthroscopy, Migraine  are also affecting patient's functional outcome.      REHAB POTENTIAL: Good   CLINICAL DECISION MAKING: Evolving/moderate complexity   EVALUATION COMPLEXITY: Moderate     GOALS: Goals reviewed with patient? Yes   SHORT TERM GOALS: Target date: 08/18/2021   Pt will be independent with initial HEP. Baseline: Goal status: Goal Met   2.  Pt will report a 30% improvement in symptoms. Baseline:  Goal status: Goal Met 09/05/2021     LONG TERM GOALS: Target date: 09/22/2021 (extended to 10/20/2021 at reeval)   Pt will be independent with advanced HEP. Baseline:  Goal status: Ongoing   2.  Pt's knee FOTO will increase to at least 61% to demonstrate improvements in functional mobility. Baseline: 50% Goal status: GOAL MET 09/12/2021 (67%)   3.  Pt will increase BUE and BLE strength to at least 4+/5 throughout to allow her to perform functional tasks in her home. Baseline:  Goal status: Ongoing (see above)   4.  Pt will report being able to reach into overhead cabinets to retrieve objects without increased pain/soreness. Baseline:  Goal status: Ongoing   5.  Pt will report  at least a 75% improvement in dizziness. Baseline:  Goal status: Ongoing (denies dizziness on 09/12/2021)     PLAN: PT FREQUENCY: 2x/week   PT DURATION: additional  4 weeks   PLANNED INTERVENTIONS: Therapeutic exercises, Therapeutic activity, Neuromuscular re-education, Balance training, Gait training, Patient/Family education, Joint mobilization, Stair training, Vestibular training, Canalith repositioning, Aquatic Therapy, Dry Needling, Electrical stimulation, Spinal manipulation, Spinal mobilization, Cryotherapy, Moist heat, Taping, Vasopneumatic device, Ultrasound, Ionotophoresis 60m/ml Dexamethasone, and Manual therapy   PLAN FOR NEXT SESSION: Strengthening, core stability/postural reeducation, balance, aquatic therapy as indicated    Kameka Whan, PT 09/12/2021, 11:04 AM  [facility] [place]isco [place] [address]Phone # [phone] [phone]

## 2021-09-15 ENCOUNTER — Ambulatory Visit: Payer: Medicare Other | Admitting: Physical Therapy

## 2021-09-15 DIAGNOSIS — R2681 Unsteadiness on feet: Secondary | ICD-10-CM

## 2021-09-15 DIAGNOSIS — M25562 Pain in left knee: Secondary | ICD-10-CM

## 2021-09-15 DIAGNOSIS — G8929 Other chronic pain: Secondary | ICD-10-CM

## 2021-09-15 DIAGNOSIS — M6281 Muscle weakness (generalized): Secondary | ICD-10-CM

## 2021-09-15 DIAGNOSIS — R2689 Other abnormalities of gait and mobility: Secondary | ICD-10-CM

## 2021-09-15 NOTE — Therapy (Signed)
OUTPATIENT PHYSICAL THERAPY REASSESSMENT NOTE   Patient Name: Kristen [name] MRN: [id number] DOB:10/25/1960, 61 y.o., adult Today's Date: 09/15/2021  PCP: Percell Belt, DO REFERRING PROVIDER: Dr Sherene Sires     END OF SESSION:   PT End of Session - 09/15/21 1220     Visit Number 12    Date for PT Re-Evaluation 10/20/21    Authorization Type MCR    Progress Note Due on Visit 105    PT Start Time 1220    PT Stop Time 1300    PT Time Calculation (min) 40 min    Activity Tolerance Patient tolerated treatment well    Behavior During Therapy [facility] for tasks assessed/performed                   Past Medical History:  Diagnosis Date   B12 deficiency anemia    BPPV (benign paroxysmal positional vertigo)    Depression    GAD (generalized anxiety disorder)    History of Graves' disease 03/2006   s/p RAI   Hypertension    followed by pcp   Hypothyroidism, postablative 2007   endocrinologist---- dr Cruzita Lederer;   hx grave's s/p RAI 12/ 2007   Insomnia    Migraine    Peripheral neuropathy    Right ovarian cyst    Past Surgical History:  Procedure Laterality Date   COLONOSCOPY WITH PROPOFOL  04/2021   LAPAROSCOPIC BILATERAL SALPINGO OOPHERECTOMY Bilateral 07/03/2021   Procedure: LAPAROSCOPIC BILATERAL SALPINGO OOPHORECTOMY; PELVIC WASHINGS;  Surgeon: Armandina Stammer, DO;  Location: [place];  Service: Gynecology;  Laterality: Bilateral;   SHOULDER ARTHROSCOPY WITH SUBACROMIAL DECOMPRESSION Right 04/11/2020   Procedure: SHOULDER ARTHROSCOPY WITH SUBACROMIAL DECOMPRESSION;  Surgeon: Tania Ade, MD;  Location: [place];  Service: Orthopedics;  Laterality: Right;   Patient Active Problem List   Diagnosis Date Noted   Lumbar radiculopathy, right 02/27/2021   Posttraumatic headache 11/24/2020   Insomnia 06/21/2020   Major depression, single episode 06/21/2020   Migraine 06/21/2020   Posttraumatic stress disorder 06/21/2020    Anxiety 06/21/2020   Allergic rhinitis 06/21/2020   Hyperacusis of both ears 05/17/2020   Laryngopharyngeal reflux (LPR) 05/17/2020   Right rotator cuff tear 03/17/2020   Excessive physiologic tremor 02/11/2015   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 No complaints. My back was a little sore after last water visit but nothing major.        Are you having pain? No  PATIENT GOALS:  To be able to move more freely without having to think about it.  OBJECTIVE: (objective measures completed at initial evaluation unless otherwise dated)  DIAGNOSTIC FINDINGS:  Knee radiographs on 4/13 unremarkable   PATIENT SURVEYS:  09/12/2021 FOTO:  67%  FOTO 50% at initial evaluation (projected 61% by visit 11)   COGNITION:           Overall cognitive status: Within functional limits for tasks assessed                          SENSATION: WFL   MUSCLE LENGTH: Hamstrings:  Right 70 deg; Left 70 deg   POSTURE:  Slight forward head, slight rounded shoulders, decreased lumbar lordosis   PALPATION: Tender to palpation over left knee, minimal edema noted   LE ROM: Bilateral knee A/ROM 0-146 degrees   LE MMT:   MMT Right 07/28/2021 Right 09/05/2021 Left 07/28/2021  Hip flexion 3+ 4 4  Hip extension 3 3+ 4-  Hip abduction 3+ 4 4  Hip adduction       Hip internal rotation       Hip external rotation       Knee flexion 4  5  Knee extension 4  5  Ankle dorsiflexion       Ankle plantarflexion       Ankle inversion       Ankle eversion         (Blank rows = not tested)   UE MMT: B shoulder strength of 4/5 grossly throughout On 09/12/2021:  B shoulder strength of 4 to 4+/5 grossly throughout   LOWER EXTREMITY SPECIAL TESTS:  Knee special tests: Anterior drawer test: negative and Posterior drawer test: negative   FUNCTIONAL TESTS:   07/28/2021: 5 times sit to stand: 17.4 sec without UE use Timed up and go (TUG): 7.9 sec   GAIT: Distance walked: 100 Assistive device utilized: None Level of assistance: Complete Independence Comments: Pt with antalgic gait noted with decreased weight bearing through RLE   VESTIBULAR ASSESSMENT: 08/01/2021:  [name] negative bilateral sides 08/22/2021:  Pt reports 2 second dizziness with left [name], proceeded with Epley.  Negative Dix Hallpike on right.       TODAY'S TREATMENT  09/15/21: Pt arrives for aquatic physical therapy. Treatment took place in 3.5-5.5 feet of water. Water temperature was 91 degrees F . Pt entered the pool via stairs with mild use of rails. Pt requires buoyancy of water for support and to offload joints with strengthening exercises.   Seated water bench with 75% submersion Pt performed seated LE AROM exercises 20x in all planes, concurrent review of status and pain assessment. Added ankle weights for LAQ 10x Bil. Standing in 50%- 75% depth water pt performed water walking in all 4 directions 10x. VC for speed in order to generate appropriate current for resistance and/or UE movements. Mitten hands added for side stepping.  Hip 3 ways 20x Bil still requires UE support for balance.High knee marching across pool 4 lengths holding onto blue noodle. Yellow noodle behind: Hip abd/add 2x10, then 8 min underwater bicycle.  Small noodle knee extensions 10x Bil.  09/12/2021: UBE level 2.0 x3 min each way Nustep with LE only level 5 x5 minutes (382 steps) with PT present to discuss status Seated with 2.5# ankle weights:  heel/toe raises, LAQ, marching, hip abduction  scissors.  2x10 bilat Standing at barre with 2.5# ankle weights:  heel raises, high marching, hip abduction, hip flexion, hip extension.  BLE 2x10 each Side stepping with green loop 3 x 10 ft bilat Monster walking fwd and back with green loop 3x10 ft bilat  09/08/21: Pt arrives for aquatic physical therapy. Treatment took place in 3.5-5.5 feet of water. Water temperature was 91 degrees F . Pt entered the pool via stairs with mild use of rails. Pt requires buoyancy of water for support and to offload joints with strengthening exercises.   Seated water bench with 75% submersion Pt performed seated LE AROM exercises 20x in all planes, concurrent review of status and pain assessment. Added ankle weights for LAQ 10x  Bil. Standing in 50%- 75% depth water pt performed water walking in all 4 directions 10x. VC for speed in order to generate appropriate current for resistance and/or UE movements. Mitten hands added for side stepping.  Hip 3 ways 20x Bil still requires UE support for balance.High knee marching across pool 4 lengths holding onto blue noodle. Yellow noodle behind: Hip abd/add 2x10, then 8 min underwater bicycle.  Small noodle knee extensions 10x Bil.  09/05/2021: Seated with 2# ankle weights:  heel/toe raises, LAQ, marching, hip abduction scissors.  2x10 bilat Sit to/from stand holding 5# kettlebell 2x10 Nustep level 5 x6 minutes (523 steps) with PT present to discuss status Standing at barre with 2# ankle weights:  heel raises, high marching, hip abduction, hip flexion, hip extension.  BLE 2x10 each Side stepping with green loop 3 x 10 ft bilat Fwd/backwards gait  Fwd step ups to 6" step without UE use x10 bilat       PATIENT EDUCATION:  Education details: Issued HEP Person educated: Patient Education method: Theatre stage manager Education comprehension: verbalized understanding     HOME EXERCISE PROGRAM: Access Code: [email or web address] URL: [email or web address] Date:  07/28/2021 Prepared by: Shelby Dubin Menke   Exercises - Seated Scapular Retraction  - 1 x daily - 7 x weekly - 2 sets - 10 reps - Supine Bridge  - 1 x daily - 7 x weekly - 2 sets - 10 reps - Supine Active Straight Leg Raise  - 1 x daily - 7 x weekly - 2 sets - 10 reps - Sit to Stand with Arms Crossed  - 1 x daily - 7 x weekly - 2 sets - 10 reps - Seated Long Arc Quad with Hip Adduction  - 1 x daily - 7 x weekly - 2 sets - 10 reps   ASSESSMENT:   CLINICAL IMPRESSION:  Pt arrives with no knee pain. Pt continues to do excellent in the pool. She moves faster with less caution and more confidence. Faster movements equals slightly more resistance in the water as she generates a current now. No pain or fatigue post aquatic session.   OBJECTIVE IMPAIRMENTS decreased balance, difficulty walking, decreased strength, dizziness, increased muscle spasms, impaired UE functional use, postural dysfunction, and pain.    ACTIVITY LIMITATIONS cleaning, community activity, meal prep, and yard work.    PERSONAL FACTORS 3+ comorbidities: BPPV, R shoulder arthroscopy, Migraine  are also affecting patient's functional outcome.      REHAB POTENTIAL: Good   CLINICAL DECISION MAKING: Evolving/moderate complexity   EVALUATION COMPLEXITY: Moderate     GOALS: Goals reviewed with patient? Yes   SHORT TERM GOALS: Target date: 08/18/2021   Pt will be independent with initial HEP. Baseline: Goal status: Goal Met   2.  Pt will report a 30% improvement in symptoms. Baseline:  Goal status: Goal Met 09/05/2021     LONG TERM GOALS: Target date: 09/22/2021 (extended to 10/20/2021 at reeval)   Pt will be independent with advanced HEP. Baseline:  Goal status: Ongoing   2.  Pt's knee FOTO will increase to at least 61% to demonstrate improvements in functional mobility. Baseline: 50% Goal status: GOAL MET 09/12/2021 (67%)   3.  Pt will increase BUE and BLE strength to at least 4+/5 throughout to allow her to perform  functional tasks in her home. Baseline:  Goal status: Ongoing (see above)   4.  Pt will report being able to reach into overhead cabinets to retrieve objects without increased pain/soreness. Baseline:  Goal status: Ongoing   5.  Pt will report at least a 75% improvement in dizziness. Baseline:  Goal status: Ongoing (denies dizziness on 09/12/2021)     PLAN: PT FREQUENCY: 2x/week   PT DURATION: additional 4 weeks   PLANNED INTERVENTIONS: Therapeutic exercises, Therapeutic activity, Neuromuscular re-education, Balance training, Gait training, Patient/Family education, Joint mobilization, Stair training, Vestibular training, Canalith repositioning, Aquatic Therapy, Dry Needling, Electrical stimulation, Spinal manipulation, Spinal mobilization, Cryotherapy, Moist heat, Taping, Vasopneumatic device, Ultrasound, Ionotophoresis 19m/ml Dexamethasone, and Manual therapy   PLAN FOR NEXT SESSION: Strengthening, core stability/postural reeducation, balance, aquatic therapy as indicated    [place] PTA 09/15/2021, 3:10 PM  [facility] [place] [place] [address]Phone # [phone] [phone]

## 2021-09-19 ENCOUNTER — Encounter: Payer: Medicare Other | Admitting: Physical Therapy

## 2021-09-19 ENCOUNTER — Ambulatory Visit: Payer: Medicare Other | Admitting: Physical Therapy

## 2021-09-19 DIAGNOSIS — G8929 Other chronic pain: Secondary | ICD-10-CM

## 2021-09-19 DIAGNOSIS — M6281 Muscle weakness (generalized): Secondary | ICD-10-CM | POA: Diagnosis not present

## 2021-09-19 NOTE — Therapy (Signed)
OUTPATIENT PHYSICAL THERAPY REASSESSMENT NOTE   Patient Name: Kristen [name] MRN: [id number] DOB:08-31-1960, 61 y.o., adult Today's Date: 09/19/2021  PCP: Percell Belt, DO REFERRING PROVIDER: Dr Sherene Sires     END OF SESSION:   PT End of Session - 09/19/21 1005     Visit Number 13    Date for PT Re-Evaluation 10/20/21    Authorization Type MCR    Progress Note Due on Visit 14    PT Start Time 1008    PT Stop Time 1050    PT Time Calculation (min) 42 min    Activity Tolerance Patient tolerated treatment well                   Past Medical History:  Diagnosis Date   B12 deficiency anemia    BPPV (benign paroxysmal positional vertigo)    Depression    GAD (generalized anxiety disorder)    History of Graves' disease 03/2006   s/p RAI   Hypertension    followed by pcp   Hypothyroidism, postablative 2007   endocrinologist---- dr Cruzita Lederer;   hx grave's s/p RAI 12/ 2007   Insomnia    Migraine    Peripheral neuropathy    Right ovarian cyst    Past Surgical History:  Procedure Laterality Date   COLONOSCOPY WITH PROPOFOL  04/2021   LAPAROSCOPIC BILATERAL SALPINGO OOPHERECTOMY Bilateral 07/03/2021   Procedure: LAPAROSCOPIC BILATERAL SALPINGO OOPHORECTOMY; PELVIC WASHINGS;  Surgeon: Armandina Stammer, DO;  Location: [place];  Service: Gynecology;  Laterality: Bilateral;   SHOULDER ARTHROSCOPY WITH SUBACROMIAL DECOMPRESSION Right 04/11/2020   Procedure: SHOULDER ARTHROSCOPY WITH SUBACROMIAL DECOMPRESSION;  Surgeon: Tania Ade, MD;  Location: [place];  Service: Orthopedics;  Laterality: Right;   Patient Active Problem List   Diagnosis Date Noted   Lumbar radiculopathy, right 02/27/2021   Posttraumatic headache 11/24/2020   Insomnia 06/21/2020   Major depression, single episode 06/21/2020   Migraine 06/21/2020   Posttraumatic stress disorder 06/21/2020   Anxiety 06/21/2020   Allergic rhinitis 06/21/2020   Hyperacusis  of both ears 05/17/2020   Laryngopharyngeal reflux (LPR) 05/17/2020   Right rotator cuff tear 03/17/2020   Excessive physiologic tremor 02/11/2015   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/14/2014    REFERRING DIAG: M25.561,M25.562,G89.29 (ICD-10-CM) - Bilateral chronic knee pain  THERAPY DIAG:  Muscle weakness (generalized)  Chronic pain of left knee  Chronic pain of right knee  PERTINENT HISTORY: 07/03/21 had bilateral ovaries removed secondary to cysts, Anxiety/Depression, Migraine, R shoulder arthroscopy with subacromial decompression on 04/11/20, BPPV  PRECAUTIONS: Fall  SUBJECTIVE: I like the pool.  I usually feel a little sore but then I'm OK.  It's definitely getting me stronger. My husband has noticed too.   Hard to get to get up off the floor and going up and down steps.  I'd like to practice that.  The dizziness has gone away.  None in 2 months.        Are you having pain? No, just feel a lot of weakness in me.  Sore.    PATIENT GOALS:  To be able to move more freely without having to think about it.  OBJECTIVE: (objective measures completed at initial evaluation unless otherwise dated)  DIAGNOSTIC FINDINGS:  Knee radiographs on 4/13 unremarkable   PATIENT SURVEYS:  09/12/2021 FOTO:  67%  FOTO 50% at initial evaluation (projected 61% by visit 11)   COGNITION:           Overall cognitive status: Within  functional limits for tasks assessed                          SENSATION: WFL   MUSCLE LENGTH: Hamstrings: Right 70 deg; Left 70 deg   POSTURE:  Slight forward head, slight rounded shoulders, decreased lumbar lordosis   PALPATION: Tender to palpation over left knee, minimal edema noted   LE ROM: Bilateral knee A/ROM 0-146 degrees   LE MMT:   MMT Right 07/28/2021 Right 09/05/2021 Left 07/28/2021  Hip flexion 3+ 4 4  Hip extension 3 3+ 4-  Hip abduction 3+ 4 4  Hip adduction       Hip internal rotation       Hip external rotation       Knee flexion 4  5  Knee  extension 4  5  Ankle dorsiflexion       Ankle plantarflexion       Ankle inversion       Ankle eversion        (Blank rows = not tested)   UE MMT: B shoulder strength of 4/5 grossly throughout On 09/12/2021:  B shoulder strength of 4 to 4+/5 grossly throughout   LOWER EXTREMITY SPECIAL TESTS:  Knee special tests: Anterior drawer test: negative and Posterior drawer test: negative   FUNCTIONAL TESTS:   07/28/2021: 5 times sit to stand: 17.4 sec without UE use Timed up and go (TUG): 7.9 sec   GAIT: Distance walked: 100 Assistive device utilized: None Level of assistance: Complete Independence Comments: Pt with antalgic gait noted with decreased weight bearing through RLE   VESTIBULAR ASSESSMENT: 08/01/2021:  [name] negative bilateral sides 08/22/2021:  Pt reports 2 second dizziness with left [name], proceeded with Epley.  Negative Dix Hallpike on right.       TODAY'S TREATMENT 6/13: UBE level 2.0 x3 min each way Nustep with LE only level 5 x5 minutes (382 steps) with PT present to discuss status Seated with 2.5# ankle weights:  heel/toe raises plus 5# resting on knee;  LAQ, hip flexion up and over pool noodle 2x10 bilat Sit to stand holding 5# weight 3x2 Side stepping with 2.5# weights on ankles 5x; Fwd step ups to 6" step without UE use x10 bilat Standing  with 2.5# ankle weights:   step taps,  hip abduction, hip extension.  BLE 2x10 each Standing to 1/2 knee on 3 cushions then progressed to 2 cushions and using step railings to assist to rise 2x 5  Climbing onto mat table, side sit push into quadruped and crawl off table to standing 2x5 On mat table quadruped to 1/2 kneel 5x each side Therapeutic activity: stairs, gettting of the floor  09/15/21: Pt arrives for aquatic physical therapy. Treatment took place in 3.5-5.5 feet of water. Water temperature was 91 degrees F . Pt entered the pool via stairs with mild use of rails. Pt requires buoyancy of water for support  and to offload joints with strengthening exercises.   Seated water bench with 75% submersion Pt performed seated LE AROM exercises 20x in all planes, concurrent review of status and pain assessment. Added ankle weights for LAQ 10x Bil. Standing in 50%- 75% depth water pt performed water walking in all 4 directions 10x. VC for speed in order to generate appropriate current for resistance and/or UE movements. Mitten hands added for side stepping.  Hip 3 ways 20x Bil still requires UE support for balance.High knee marching across pool 4  lengths holding onto blue noodle. Yellow noodle behind: Hip abd/add 2x10, then 8 min underwater bicycle.  Small noodle knee extensions 10x Bil.  09/12/2021: UBE level 2.0 x3 min each way Nustep with LE only level 5 x5 minutes (382 steps) with PT present to discuss status Seated with 2.5# ankle weights:  heel/toe raises, LAQ, marching, hip abduction scissors.  2x10 bilat Standing at barre with 2.5# ankle weights:  heel raises, high marching, hip abduction, hip flexion, hip extension.  BLE 2x10 each Side stepping with green loop 3 x 10 ft bilat Monster walking fwd and back with green loop 3x10 ft bilat       PATIENT EDUCATION:  Education details: Issued HEP Person educated: Patient Education method: Theatre stage manager Education comprehension: verbalized understanding     HOME EXERCISE PROGRAM: Access Code: [email or web address] URL: [email or web address] Date: 07/28/2021 Prepared by: Shelby Dubin Menke   Exercises - Seated Scapular Retraction  - 1 x daily - 7 x weekly - 2 sets - 10 reps - Supine Bridge  - 1 x daily - 7 x weekly - 2 sets - 10 reps - Supine Active Straight Leg Raise  - 1 x daily - 7 x weekly - 2 sets - 10 reps - Sit to Stand with Arms Crossed  - 1 x daily - 7 x weekly - 2 sets - 10 reps - Seated Long Arc Quad with Hip Adduction  - 1 x daily - 7 x weekly - 2 sets - 10 reps   ASSESSMENT:   CLINICAL IMPRESSION:  The patient is able to  perform a progression of exercises to work toward her goal of getting up off the floor and ascending steps with greater ease.  Repetitions keep low (5 reps) to avoid "overdoing it."  She notes greater difficulty maneuvering her left LE.  Therapist monitoring response and providing close supervision for safety.    OBJECTIVE IMPAIRMENTS decreased balance, difficulty walking, decreased strength, dizziness, increased muscle spasms, impaired UE functional use, postural dysfunction, and pain.    ACTIVITY LIMITATIONS cleaning, community activity, meal prep, and yard work.    PERSONAL FACTORS 3+ comorbidities: BPPV, R shoulder arthroscopy, Migraine  are also affecting patient's functional outcome.      REHAB POTENTIAL: Good   CLINICAL DECISION MAKING: Evolving/moderate complexity   EVALUATION COMPLEXITY: Moderate     GOALS: Goals reviewed with patient? Yes   SHORT TERM GOALS: Target date: 08/18/2021   Pt will be independent with initial HEP. Baseline: Goal status: Goal Met   2.  Pt will report a 30% improvement in symptoms. Baseline:  Goal status: Goal Met 09/05/2021     LONG TERM GOALS: Target date: 09/22/2021 (extended to 10/20/2021 at reeval)   Pt will be independent with advanced HEP. Baseline:  Goal status: Ongoing   2.  Pt's knee FOTO will increase to at least 61% to demonstrate improvements in functional mobility. Baseline: 50% Goal status: GOAL MET 09/12/2021 (67%)   3.  Pt will increase BUE and BLE strength to at least 4+/5 throughout to allow her to perform functional tasks in her home. Baseline:  Goal status: Ongoing (see above)   4.  Pt will report being able to reach into overhead cabinets to retrieve objects without increased pain/soreness. Baseline:  Goal status: Ongoing   5.  Pt will report at least a 75% improvement in dizziness. Baseline:  Goal status: Ongoing (denies dizziness on 09/12/2021)     PLAN: PT FREQUENCY: 2x/week   PT DURATION: additional 4 weeks  PLANNED INTERVENTIONS: Therapeutic exercises, Therapeutic activity, Neuromuscular re-education, Balance training, Gait training, Patient/Family education, Joint mobilization, Stair training, Vestibular training, Canalith repositioning, Aquatic Therapy, Dry Needling, Electrical stimulation, Spinal manipulation, Spinal mobilization, Cryotherapy, Moist heat, Taping, Vasopneumatic device, Ultrasound, Ionotophoresis 73m/ml Dexamethasone, and Manual therapy   PLAN FOR NEXT SESSION: Strengthening, core stability/postural reeducation, balance, aquatic therapy as indicated   SRuben Im PT 09/19/21 10:58 AM Phone: [phone]: [phone]  [address] [place] [place] [address]Phone # [phone] [phone]

## 2021-09-20 ENCOUNTER — Encounter: Payer: Self-pay | Admitting: Physical Therapy

## 2021-09-20 ENCOUNTER — Ambulatory Visit: Payer: Medicare Other | Admitting: Physical Therapy

## 2021-09-20 DIAGNOSIS — M6281 Muscle weakness (generalized): Secondary | ICD-10-CM

## 2021-09-20 DIAGNOSIS — G8929 Other chronic pain: Secondary | ICD-10-CM

## 2021-09-20 DIAGNOSIS — R2689 Other abnormalities of gait and mobility: Secondary | ICD-10-CM

## 2021-09-20 DIAGNOSIS — R2681 Unsteadiness on feet: Secondary | ICD-10-CM

## 2021-09-20 DIAGNOSIS — M25562 Pain in left knee: Secondary | ICD-10-CM

## 2021-09-20 NOTE — Therapy (Signed)
OUTPATIENT PHYSICAL THERAPY REASSESSMENT NOTE   Patient Name: Kristen [name] MRN: [id number] DOB:1960/10/26, 61 y.o., adult Today's Date: 09/20/2021  PCP: Percell Belt, DO REFERRING PROVIDER: Dr Sherene Sires     END OF SESSION:   PT End of Session - 09/20/21 1055     Visit Number 14    Date for PT Re-Evaluation 10/20/21    Authorization Type MCR    Progress Note Due on Visit 50    PT Start Time 0845    PT Stop Time 0930    PT Time Calculation (min) 45 min    Activity Tolerance Patient tolerated treatment well    Behavior During Therapy [facility] for tasks assessed/performed                    Past Medical History:  Diagnosis Date   B12 deficiency anemia    BPPV (benign paroxysmal positional vertigo)    Depression    GAD (generalized anxiety disorder)    History of Graves' disease 03/2006   s/p RAI   Hypertension    followed by pcp   Hypothyroidism, postablative 2007   endocrinologist---- dr Cruzita Lederer;   hx grave's s/p RAI 12/ 2007   Insomnia    Migraine    Peripheral neuropathy    Right ovarian cyst    Past Surgical History:  Procedure Laterality Date   COLONOSCOPY WITH PROPOFOL  04/2021   LAPAROSCOPIC BILATERAL SALPINGO OOPHERECTOMY Bilateral 07/03/2021   Procedure: LAPAROSCOPIC BILATERAL SALPINGO OOPHORECTOMY; PELVIC WASHINGS;  Surgeon: Armandina Stammer, DO;  Location: [place];  Service: Gynecology;  Laterality: Bilateral;   SHOULDER ARTHROSCOPY WITH SUBACROMIAL DECOMPRESSION Right 04/11/2020   Procedure: SHOULDER ARTHROSCOPY WITH SUBACROMIAL DECOMPRESSION;  Surgeon: Tania Ade, MD;  Location: [place];  Service: Orthopedics;  Laterality: Right;   Patient Active Problem List   Diagnosis Date Noted   Lumbar radiculopathy, right 02/27/2021   Posttraumatic headache 11/24/2020   Insomnia 06/21/2020   Major depression, single episode 06/21/2020   Migraine 06/21/2020   Posttraumatic stress disorder 06/21/2020    Anxiety 06/21/2020   Allergic rhinitis 06/21/2020   Hyperacusis of both ears 05/17/2020   Laryngopharyngeal reflux (LPR) 05/17/2020   Right rotator cuff tear 03/17/2020   Excessive physiologic tremor 02/11/2015   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        Are you having pain? No, just feel a lot of weakness in me.  Sore.    PATIENT GOALS:  To be able to move more freely without having to think about it.  OBJECTIVE: (objective measures completed at initial evaluation unless otherwise dated)  DIAGNOSTIC FINDINGS:  Knee radiographs on 4/13 unremarkable   PATIENT SURVEYS:  09/12/2021 FOTO:  67%  FOTO 50% at initial evaluation (projected 61% by visit 11)   COGNITION:           Overall cognitive status: Within functional limits for tasks assessed  SENSATION: WFL   MUSCLE LENGTH: Hamstrings: Right 70 deg; Left 70 deg   POSTURE:  Slight forward head, slight rounded shoulders, decreased lumbar lordosis   PALPATION: Tender to palpation over left knee, minimal edema noted   LE ROM: Bilateral knee A/ROM 0-146 degrees   LE MMT:   MMT Right 07/28/2021 Right 09/05/2021 Left 07/28/2021  Hip flexion 3+ 4 4  Hip extension 3 3+ 4-  Hip abduction 3+ 4 4  Hip adduction       Hip internal rotation       Hip external rotation       Knee flexion 4  5  Knee extension 4  5  Ankle dorsiflexion       Ankle plantarflexion       Ankle inversion        Ankle eversion        (Blank rows = not tested)   UE MMT: B shoulder strength of 4/5 grossly throughout On 09/12/2021:  B shoulder strength of 4 to 4+/5 grossly throughout   LOWER EXTREMITY SPECIAL TESTS:  Knee special tests: Anterior drawer test: negative and Posterior drawer test: negative   FUNCTIONAL TESTS:   07/28/2021: 5 times sit to stand: 17.4 sec without UE use Timed up and go (TUG): 7.9 sec   GAIT: Distance walked: 100 Assistive device utilized: None Level of assistance: Complete Independence Comments: Pt with antalgic gait noted with decreased weight bearing through RLE   VESTIBULAR ASSESSMENT: 08/01/2021:  [name] negative bilateral sides 08/22/2021:  Pt reports 2 second dizziness with left [name], proceeded with Epley.  Negative Dix Hallpike on right.       TODAY'S TREATMENT 09/20/21: Pt arrives for aquatic physical therapy. Treatment took place in 3.5-5.5 feet of water. Water temperature was 91 degrees F . Pt entered the pool via stairs with mild use of rails. Pt requires buoyancy of water for support and to offload joints with strengthening exercises.   Seated water bench with 75% submersion Pt performed seated LE AROM exercises 20x in all planes, concurrent review of status and pain assessment. Added ankle weights for LAQ 10x2 Bil. Standing in 50%- 75% depth water pt performed water walking in all 4 directions 10x. VC for speed in order to generate appropriate current for resistance and/or UE movements. Mitten hands added for side stepping.  Hip 3 ways 20x Bil still requires UE support for balance but added ankle weights today. High knee marching across pool 4 lengths holding onto blue noodle. Yellow noodle behind: Hip abd/add 2x10, then 8 min underwater bicycle.  Small noodle knee extensions 10x Bil. Step ups on water step 10x Bil no UE. 6/13: UBE level 2.0 x3 min each way Nustep with LE only level 5 x5 minutes (382 steps) with PT present to discuss  status Seated with 2.5# ankle weights:  heel/toe raises plus 5# resting on knee;  LAQ, hip flexion up and over pool noodle 2x10 bilat Sit to stand holding 5# weight 3x2 Side stepping with 2.5# weights on ankles 5x; Fwd step ups to 6" step without UE use x10 bilat Standing  with 2.5# ankle weights:   step taps,  hip abduction, hip extension.  BLE 2x10 each Standing to 1/2 knee on 3 cushions then progressed to 2 cushions and using step railings to assist to rise 2x 5  Climbing onto mat table, side sit push into quadruped and crawl off table to standing 2x5 On mat table quadruped to 1/2 kneel 5x each side  Therapeutic activity: stairs, gettting of the floor  09/15/21: Pt arrives for aquatic physical therapy. Treatment took place in 3.5-5.5 feet of water. Water temperature was 91 degrees F . Pt entered the pool via stairs with mild use of rails. Pt requires buoyancy of water for support and to offload joints with strengthening exercises.   Seated water bench with 75% submersion Pt performed seated LE AROM exercises 20x in all planes, concurrent review of status and pain assessment. Added ankle weights for LAQ 10x Bil. Standing in 50%- 75% depth water pt performed water walking in all 4 directions 10x. VC for speed in order to generate appropriate current for resistance and/or UE movements. Mitten hands added for side stepping.  Hip 3 ways 20x Bil still requires UE support for balance.High knee marching across pool 4 lengths holding onto blue noodle. Yellow noodle behind: Hip abd/add 2x10, then 8 min underwater bicycle.  Small noodle knee extensions 10x Bil. Step ups on water step 10x Bil no UE required.        PATIENT EDUCATION:  Education details: Issued HEP Person educated: Patient Education method: Theatre stage manager Education comprehension: verbalized understanding     HOME EXERCISE PROGRAM: Access Code: [email or web address] URL: [email or web address] Date: 07/28/2021 Prepared by:  Shelby Dubin Menke   Exercises - Seated Scapular Retraction  - 1 x daily - 7 x weekly - 2 sets - 10 reps - Supine Bridge  - 1 x daily - 7 x weekly - 2 sets - 10 reps - Supine Active Straight Leg Raise  - 1 x daily - 7 x weekly - 2 sets - 10 reps - Sit to Stand with Arms Crossed  - 1 x daily - 7 x weekly - 2 sets - 10 reps - Seated Long Arc Quad with Hip Adduction  - 1 x daily - 7 x weekly - 2 sets - 10 reps   ASSESSMENT:   CLINICAL IMPRESSION:  Pt arrives to aquatic therapy with sore legs. At end of session soreness was abolished. Pt able to add resistannce to hip 3 ways and could control the weight very well. No reports of pain throughout session. OBJECTIVE IMPAIRMENTS decreased balance, difficulty walking, decreased strength, dizziness, increased muscle spasms, impaired UE functional use, postural dysfunction, and pain.    ACTIVITY LIMITATIONS cleaning, community activity, meal prep, and yard work.    PERSONAL FACTORS 3+ comorbidities: BPPV, R shoulder arthroscopy, Migraine  are also affecting patient's functional outcome.      REHAB POTENTIAL: Good   CLINICAL DECISION MAKING: Evolving/moderate complexity   EVALUATION COMPLEXITY: Moderate     GOALS: Goals reviewed with patient? Yes   SHORT TERM GOALS: Target date: 08/18/2021   Pt will be independent with initial HEP. Baseline: Goal status: Goal Met   2.  Pt will report a 30% improvement in symptoms. Baseline:  Goal status: Goal Met 09/05/2021     LONG TERM GOALS: Target date: 09/22/2021 (extended to 10/20/2021 at reeval)   Pt will be independent with advanced HEP. Baseline:  Goal status: Ongoing   2.  Pt's knee FOTO will increase to at least 61% to demonstrate improvements in functional mobility. Baseline: 50% Goal status: GOAL MET 09/12/2021 (67%)   3.  Pt will increase BUE and BLE strength to at least 4+/5 throughout to allow her to perform functional tasks in her home. Baseline:  Goal status: Ongoing (see above)   4.   Pt will report being able to reach into overhead cabinets to retrieve objects without increased  pain/soreness. Baseline:  Goal status: Ongoing   5.  Pt will report at least a 75% improvement in dizziness. Baseline:  Goal status: Ongoing (denies dizziness on 09/12/2021)     PLAN: PT FREQUENCY: 2x/week   PT DURATION: additional 4 weeks   PLANNED INTERVENTIONS: Therapeutic exercises, Therapeutic activity, Neuromuscular re-education, Balance training, Gait training, Patient/Family education, Joint mobilization, Stair training, Vestibular training, Canalith repositioning, Aquatic Therapy, Dry Needling, Electrical stimulation, Spinal manipulation, Spinal mobilization, Cryotherapy, Moist heat, Taping, Vasopneumatic device, Ultrasound, Ionotophoresis 69m/ml Dexamethasone, and Manual therapy   PLAN FOR NEXT SESSION: Strengthening, core stability/postural reeducation, balance, aquatic therapy as indicated   JMyrene Galas PTA 09/20/21 9:04 PM   [facility] [address] [place] [address]Phone # [phone] [phone]

## 2021-09-22 ENCOUNTER — Ambulatory Visit (INDEPENDENT_AMBULATORY_CARE_PROVIDER_SITE_OTHER): Payer: Medicare Other | Admitting: Family Medicine

## 2021-09-22 ENCOUNTER — Encounter: Payer: Self-pay | Admitting: Family Medicine

## 2021-09-22 VITALS — BP 110/72 | HR 75 | Ht 63.0 in | Wt 140.8 lb

## 2021-09-22 DIAGNOSIS — M25562 Pain in left knee: Secondary | ICD-10-CM

## 2021-09-22 DIAGNOSIS — G8929 Other chronic pain: Secondary | ICD-10-CM

## 2021-09-22 DIAGNOSIS — R29898 Other symptoms and signs involving the musculoskeletal system: Secondary | ICD-10-CM | POA: Diagnosis not present

## 2021-09-22 DIAGNOSIS — R2689 Other abnormalities of gait and mobility: Secondary | ICD-10-CM

## 2021-09-22 DIAGNOSIS — M25561 Pain in right knee: Secondary | ICD-10-CM

## 2021-09-22 NOTE — Patient Instructions (Signed)
Good to see you today.  Con't w/ PT per their recommendations.  Follow-up as needed.

## 2021-09-22 NOTE — Progress Notes (Signed)
I, Wendy Poet, LAT, ATC, am serving as scribe for Dr. Lynne Leader.  Kristen [name] is a 61 y.o. adult who presents to [place] at [facility] today for follow-up of chronic bilateral knee pain.  Patient was last seen 2 months ago on July 20, 2021 for chronic knee pain.  At that visit pain was thought primarily to be due to patellofemoral pain syndrome/chondromalacia patella.  Additionally some of the pain was thought to be due to medial compartment DJD.  At that visit she had right steroid injection as well as physical therapy referral for quad strengthening.  Since her last visit with me she has had 14 physical therapy visits (aquatic PT) with her last visit on June 14.  Goals of physical therapy for her leg pain as well as for dizziness thought to be related to BPPV.  She notes that her knee pain is doing better and she feels that PT is really helping her.  She feels that her R knee injection at her last visit helped.  She has 8 more PT visits scheduled.  Dx imaging: 07/20/21 R & L knee XR  Pertinent review of systems: No fevers or chills  Relevant historical information: Hypothyroidism post ablative.   Exam:  BP 110/72 (BP Location: Right Arm, Patient Position: Sitting, Cuff Size: Normal)   Pulse 75   Ht '5\' 3"'$  (1.6 m)   Wt 140 lb 12.8 oz (63.9 kg)   LMP 08/16/2010   SpO2 98%   BMI 24.94 kg/m  General: Well Developed, well nourished, and in no acute distress.   MSK: L-spine: Nontender midline. Normal lumbar motion. Lower extremity strength intact with exception of bilateral foot dorsiflexion which is slightly diminished 4/5. Knees normal.  Normal motion with mild crepitation.    Lab and Radiology Results    EXAM: RIGHT KNEE 3 VIEWS   COMPARISON:  None.   FINDINGS: No evidence of fracture, dislocation, or joint effusion. No evidence of arthropathy or other focal bone abnormality. Soft tissues are unremarkable.   IMPRESSION: Negative.      Electronically Signed   By: Virgina Norfolk M.D.   On: 07/21/2021 03:26   EXAM: LEFT KNEE 3 VIEWS   COMPARISON:  None.   FINDINGS: No evidence of fracture, dislocation, or joint effusion. No evidence of arthropathy or other focal bone abnormality. Soft tissues are unremarkable.   IMPRESSION: Negative.     Electronically Signed   By: Virgina Norfolk M.D.   On: 07/21/2021 02:45   EXAM: MRI LUMBAR SPINE WITHOUT CONTRAST   TECHNIQUE: Multiplanar, multisequence MR imaging of the lumbar spine was performed. No intravenous contrast was administered.   COMPARISON:  MRI lumbar spine 01/29/2018   FINDINGS: Segmentation:  5 lumbar segments   Alignment:  Normal   Vertebrae:  Normal bone marrow.  Negative for fracture or mass.   Conus medullaris and cauda equina: Conus extends to the no L1-2 level. Conus and cauda equina appear normal.   Paraspinal and other soft tissues: Negative for paraspinous mass or adenopathy.   Disc levels:   L1-2: Negative   L2-3: Negative   L3-4: Negative   L4-5: Mild disc degeneration. Shallow central disc protrusion unchanged. Mild facet degeneration. No significant stenosis.   L5-S1: Mild disc bulging.  Negative for stenosis.   IMPRESSION: Shallow central disc protrusion L4-5 unchanged from 2019.   No significant neural impingement in the lumbar spine.     Electronically Signed   By: Franchot Gallo M.D.  On: 03/15/2021 11:30  I, Lynne Leader, personally (independently) visualized and performed the interpretation of the images attached in this note.      Assessment and Plan: 61 y.o. adult with knee pain and leg weakness.  This is significantly improving with PT including dry land and aquatic PT.  Plan to continue home exercise program and continue limited PT.  She will slowly regain strength which should be quite helpful.  Additionally PT is helping with vestibular dizziness secondary to BPPV which should be quite  helpful.  Check back with me as needed.  Precautions reviewed. Total encounter time 20 minutes including face-to-face time with the patient and, reviewing past medical record, and charting on the date of service.         Discussed warning signs or symptoms. Please see discharge instructions. Patient expresses understanding.   The above documentation has been reviewed and is accurate and complete Lynne Leader, M.D.

## 2021-09-27 ENCOUNTER — Encounter: Payer: Self-pay | Admitting: Rehabilitative and Restorative Service Providers"

## 2021-09-27 ENCOUNTER — Ambulatory Visit: Payer: Medicare Other | Admitting: Rehabilitative and Restorative Service Providers"

## 2021-09-27 DIAGNOSIS — G8929 Other chronic pain: Secondary | ICD-10-CM

## 2021-09-27 DIAGNOSIS — M6281 Muscle weakness (generalized): Secondary | ICD-10-CM

## 2021-09-27 DIAGNOSIS — R2681 Unsteadiness on feet: Secondary | ICD-10-CM

## 2021-09-27 DIAGNOSIS — R42 Dizziness and giddiness: Secondary | ICD-10-CM

## 2021-09-27 DIAGNOSIS — R252 Cramp and spasm: Secondary | ICD-10-CM

## 2021-09-27 DIAGNOSIS — R2689 Other abnormalities of gait and mobility: Secondary | ICD-10-CM

## 2021-09-27 DIAGNOSIS — M542 Cervicalgia: Secondary | ICD-10-CM

## 2021-09-27 NOTE — Therapy (Signed)
OUTPATIENT PHYSICAL THERAPY TREATMENT NOTE   Patient Name: Kristen [name] MRN: [id number] DOB:10/18/1960, 61 y.o., adult Today's Date: 09/27/2021  PCP: Percell Belt, DO REFERRING PROVIDER: Dr Sherene Sires     END OF SESSION:   PT End of Session - 09/27/21 1104     Visit Number 15    Date for PT Re-Evaluation 10/20/21    Authorization Type MCR    Progress Note Due on Visit 19    PT Start Time 1100    PT Stop Time 1140    PT Time Calculation (min) 40 min    Activity Tolerance Patient tolerated treatment well    Behavior During Therapy [facility] for tasks assessed/performed                    Past Medical History:  Diagnosis Date   B12 deficiency anemia    BPPV (benign paroxysmal positional vertigo)    Depression    GAD (generalized anxiety disorder)    History of Graves' disease 03/2006   s/p RAI   Hypertension    followed by pcp   Hypothyroidism, postablative 2007   endocrinologist---- dr Cruzita Lederer;   hx grave's s/p RAI 12/ 2007   Insomnia    Migraine    Peripheral neuropathy    Right ovarian cyst    Past Surgical History:  Procedure Laterality Date   COLONOSCOPY WITH PROPOFOL  04/2021   LAPAROSCOPIC BILATERAL SALPINGO OOPHERECTOMY Bilateral 07/03/2021   Procedure: LAPAROSCOPIC BILATERAL SALPINGO OOPHORECTOMY; PELVIC WASHINGS;  Surgeon: Armandina Stammer, DO;  Location: [place];  Service: Gynecology;  Laterality: Bilateral;   SHOULDER ARTHROSCOPY WITH SUBACROMIAL DECOMPRESSION Right 04/11/2020   Procedure: SHOULDER ARTHROSCOPY WITH SUBACROMIAL DECOMPRESSION;  Surgeon: Tania Ade, MD;  Location: [place];  Service: Orthopedics;  Laterality: Right;   Patient Active Problem List   Diagnosis Date Noted   Lumbar radiculopathy, right 02/27/2021   Posttraumatic headache 11/24/2020   Insomnia 06/21/2020   Major depression, single episode 06/21/2020   Migraine 06/21/2020   Posttraumatic stress disorder 06/21/2020    Anxiety 06/21/2020   Allergic rhinitis 06/21/2020   Hyperacusis of both ears 05/17/2020   Laryngopharyngeal reflux (LPR) 05/17/2020   Right rotator cuff tear 03/17/2020   Excessive physiologic tremor 02/11/2015   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/14/2014    REFERRING DIAG: M25.561,M25.562,G89.29 (ICD-10-CM) - Bilateral chronic knee pain  THERAPY DIAG:  Muscle weakness (generalized)  Chronic pain of left knee  Chronic pain of right knee  Balance problem  Unsteadiness on feet  Cramp and spasm  Dizziness and giddiness  Cervicalgia  PERTINENT HISTORY: 07/03/21 had bilateral ovaries removed secondary to cysts, Anxiety/Depression, Migraine, R shoulder arthroscopy with subacromial decompression on 04/11/20, BPPV  PRECAUTIONS: Fall  SUBJECTIVE: Pt reports that a couple of days ago, she was coming down the steps and felt a sharp sensation as she stepped down and she has been having some increased pain since that time.       PAIN:  Are you having pain? Yes: NPRS scale: 7/10 Pain location: right side of back/hip Pain description: aching Aggravating factors: stairs Relieving factors: rest    PATIENT GOALS:  To be able to move more freely without having to think about it.  OBJECTIVE: (objective measures completed at initial evaluation unless otherwise dated)  DIAGNOSTIC FINDINGS:  Knee radiographs on 4/13 unremarkable   PATIENT SURVEYS:  09/12/2021 FOTO:  67%  FOTO 50% at initial evaluation (projected 61% by visit 11)   COGNITION:  Overall cognitive status: Within functional limits for tasks assessed                          SENSATION: WFL   MUSCLE LENGTH: Hamstrings: Right 70 deg; Left 70 deg   POSTURE:  Slight forward head, slight rounded shoulders, decreased lumbar lordosis   PALPATION: Tender to palpation over left knee, minimal edema noted   LE ROM: Bilateral knee A/ROM 0-146 degrees   LE MMT:   MMT Right 07/28/2021 Right 09/05/2021 Left 07/28/2021   Hip flexion 3+ 4 4  Hip extension 3 3+ 4-  Hip abduction 3+ 4 4  Hip adduction       Hip internal rotation       Hip external rotation       Knee flexion 4  5  Knee extension 4  5  Ankle dorsiflexion       Ankle plantarflexion       Ankle inversion       Ankle eversion        (Blank rows = not tested)   UE MMT: B shoulder strength of 4/5 grossly throughout On 09/12/2021:  B shoulder strength of 4 to 4+/5 grossly throughout   LOWER EXTREMITY SPECIAL TESTS:  Knee special tests: Anterior drawer test: negative and Posterior drawer test: negative   FUNCTIONAL TESTS:   07/28/2021: 5 times sit to stand: 17.4 sec without UE use Timed up and go (TUG): 7.9 sec   GAIT: Distance walked: 100 Assistive device utilized: None Level of assistance: Complete Independence Comments: Pt with antalgic gait noted with decreased weight bearing through RLE   VESTIBULAR ASSESSMENT: 08/01/2021:  [name] negative bilateral sides 08/22/2021:  Pt reports 2 second dizziness with left [name], proceeded with [place].  Negative Dix Hallpike on right.       TODAY'S TREATMENT  09/27/2021: Nustep level 4 x6 min with PT present to discuss status Seated with 5# ankle weights:  LAQ, marching, heel/toe raises, hip abduction scissors.  2x10 bilat Unilateral long sitting hamstring stretch 2x20 sec bilat Seated piriformis stretch 2x20 sec bilat Standing to half kneel with 2 cushions on floor for right side and 3 for left x10 each standing at barre for support Side stepping with yellow loop 3x10 ft bilat Fwd and backwards monster walk with yellow loop 3x10 ft each Fwd and lateral step ups to 6" step with UE support x10 bilat each Manual therapy:  soft tissue mobilization to right lumbar multifidi to promote tissue elongation  09/20/21: Pt arrives for aquatic physical therapy. Treatment took place in 3.5-5.5 feet of water. Water temperature was 91 degrees F . Pt entered the pool via stairs with mild use of  rails. Pt requires buoyancy of water for support and to offload joints with strengthening exercises.   Seated water bench with 75% submersion Pt performed seated LE AROM exercises 20x in all planes, concurrent review of status and pain assessment. Added ankle weights for LAQ 10x2 Bil. Standing in 50%- 75% depth water pt performed water walking in all 4 directions 10x. VC for speed in order to generate appropriate current for resistance and/or UE movements. Mitten hands added for side stepping.  Hip 3 ways 20x Bil still requires UE support for balance but added ankle weights today. High knee marching across pool 4 lengths holding onto blue noodle. Yellow noodle behind: Hip abd/add 2x10, then 8 min underwater bicycle.  Small noodle knee extensions 10x Bil. Step ups on  water step 10x Bil no UE.  6/13: UBE level 2.0 x3 min each way Nustep with LE only level 5 x5 minutes (382 steps) with PT present to discuss status Seated with 2.5# ankle weights:  heel/toe raises plus 5# resting on knee;  LAQ, hip flexion up and over pool noodle 2x10 bilat Sit to stand holding 5# weight 3x2 Side stepping with 2.5# weights on ankles 5x; Fwd step ups to 6" step without UE use x10 bilat Standing  with 2.5# ankle weights:   step taps,  hip abduction, hip extension.  BLE 2x10 each Standing to 1/2 knee on 3 cushions then progressed to 2 cushions and using step railings to assist to rise 2x 5  Climbing onto mat table, side sit push into quadruped and crawl off table to standing 2x5 On mat table quadruped to 1/2 kneel 5x each side Therapeutic activity: stairs, gettting of the floor      PATIENT EDUCATION:  Education details: Issued HEP Person educated: Patient Education method: Theatre stage manager Education comprehension: verbalized understanding     HOME EXERCISE PROGRAM: Access Code: [email or web address] URL: [email or web address] Date: 07/28/2021 Prepared by: Shelby Dubin Zaya Kessenich   Exercises - Seated  Scapular Retraction  - 1 x daily - 7 x weekly - 2 sets - 10 reps - Supine Bridge  - 1 x daily - 7 x weekly - 2 sets - 10 reps - Supine Active Straight Leg Raise  - 1 x daily - 7 x weekly - 2 sets - 10 reps - Sit to Stand with Arms Crossed  - 1 x daily - 7 x weekly - 2 sets - 10 reps - Seated Long Arc Quad with Hip Adduction  - 1 x daily - 7 x weekly - 2 sets - 10 reps   ASSESSMENT:   CLINICAL IMPRESSION:  Ms Tartaglia presents to skilled rehabilitation with some increased pain following an event when she was coming down the stairs and stepped wrong.  Pt states that she has gone to the chiropractor and felt some better, but has been overall still having increased pain in the right side of her lumbar region.  Pt continues to have difficulty going to half kneeling position and requires use of UE and cushions under knee to assist.  Pt continues to require skilled PT to progress towards goal related activities.  OBJECTIVE IMPAIRMENTS decreased balance, difficulty walking, decreased strength, dizziness, increased muscle spasms, impaired UE functional use, postural dysfunction, and pain.    ACTIVITY LIMITATIONS cleaning, community activity, meal prep, and yard work.    PERSONAL FACTORS 3+ comorbidities: BPPV, R shoulder arthroscopy, Migraine  are also affecting patient's functional outcome.      REHAB POTENTIAL: Good   CLINICAL DECISION MAKING: Evolving/moderate complexity   EVALUATION COMPLEXITY: Moderate     GOALS: Goals reviewed with patient? Yes   SHORT TERM GOALS: Target date: 08/18/2021   Pt will be independent with initial HEP. Baseline: Goal status: Goal Met   2.  Pt will report a 30% improvement in symptoms. Baseline:  Goal status: Goal Met 09/05/2021     LONG TERM GOALS: Target date: 09/22/2021 (extended to 10/20/2021 at reeval)   Pt will be independent with advanced HEP. Baseline:  Goal status: Ongoing   2.  Pt's knee FOTO will increase to at least 61% to demonstrate  improvements in functional mobility. Baseline: 50% Goal status: GOAL MET 09/12/2021 (67%)   3.  Pt will increase BUE and BLE strength to at least 4+/5 throughout to allow  her to perform functional tasks in her home. Baseline:  Goal status: Ongoing (see above)   4.  Pt will report being able to reach into overhead cabinets to retrieve objects without increased pain/soreness. Baseline:  Goal status: Ongoing   5.  Pt will report at least a 75% improvement in dizziness. Baseline:  Goal status: Ongoing (denies dizziness on 09/12/2021)     PLAN: PT FREQUENCY: 2x/week   PT DURATION: additional 4 weeks   PLANNED INTERVENTIONS: Therapeutic exercises, Therapeutic activity, Neuromuscular re-education, Balance training, Gait training, Patient/Family education, Joint mobilization, Stair training, Vestibular training, Canalith repositioning, Aquatic Therapy, Dry Needling, Electrical stimulation, Spinal manipulation, Spinal mobilization, Cryotherapy, Moist heat, Taping, Vasopneumatic device, Ultrasound, Ionotophoresis 63m/ml Dexamethasone, and Manual therapy   PLAN FOR NEXT SESSION: Strengthening, core stability/postural reeducation, balance, aquatic therapy as indicated   SJuel Burrow PT 09/27/21 11:44 AM   [id number] [place] [id number] # [phone] [phone]

## 2021-10-02 ENCOUNTER — Encounter: Payer: Self-pay | Admitting: Rehabilitative and Restorative Service Providers"

## 2021-10-02 ENCOUNTER — Ambulatory Visit: Payer: Medicare Other | Admitting: Rehabilitative and Restorative Service Providers"

## 2021-10-02 DIAGNOSIS — R2681 Unsteadiness on feet: Secondary | ICD-10-CM

## 2021-10-02 DIAGNOSIS — M6281 Muscle weakness (generalized): Secondary | ICD-10-CM | POA: Diagnosis not present

## 2021-10-02 DIAGNOSIS — R42 Dizziness and giddiness: Secondary | ICD-10-CM

## 2021-10-02 DIAGNOSIS — G8929 Other chronic pain: Secondary | ICD-10-CM

## 2021-10-02 DIAGNOSIS — M542 Cervicalgia: Secondary | ICD-10-CM

## 2021-10-02 DIAGNOSIS — R252 Cramp and spasm: Secondary | ICD-10-CM

## 2021-10-02 DIAGNOSIS — R2689 Other abnormalities of gait and mobility: Secondary | ICD-10-CM

## 2021-10-02 NOTE — Therapy (Signed)
OUTPATIENT PHYSICAL THERAPY TREATMENT NOTE   Patient Name: Kristen [name] MRN: [id number] DOB:[birth year], 61 y.o., adult Today's Date: 10/02/2021  PCP: Mattie Marlin, DO REFERRING PROVIDER: Dr Earma Reading     END OF SESSION:   PT End of Session - 10/02/21 1022     Visit Number 16    Date for PT Re-Evaluation 10/20/21    Authorization Type MCR    Progress Note Due on Visit 19    PT Start Time 1020    PT Stop Time 1100    PT Time Calculation (min) 40 min    Activity Tolerance Patient tolerated treatment well    Behavior During Therapy [facility] for tasks assessed/performed                    Past Medical History:  Diagnosis Date   B12 deficiency anemia    BPPV (benign paroxysmal positional vertigo)    Depression    GAD (generalized anxiety disorder)    History of Graves' disease 03/2006   s/p RAI   Hypertension    followed by pcp   Hypothyroidism, postablative 2007   endocrinologist---- dr Elvera Lennox;   hx grave's s/p RAI 12/ 2007   Insomnia    Migraine    Peripheral neuropathy    Right ovarian cyst    Past Surgical History:  Procedure Laterality Date   COLONOSCOPY WITH PROPOFOL  04/2021   LAPAROSCOPIC BILATERAL SALPINGO OOPHERECTOMY Bilateral 07/03/2021   Procedure: LAPAROSCOPIC BILATERAL SALPINGO OOPHORECTOMY; PELVIC WASHINGS;  Surgeon: Toy Baker, DO;  Location: [facility];  Service: Gynecology;  Laterality: Bilateral;   SHOULDER ARTHROSCOPY WITH SUBACROMIAL DECOMPRESSION Right 04/11/2020   Procedure: SHOULDER ARTHROSCOPY WITH SUBACROMIAL DECOMPRESSION;  Surgeon: Jones Broom, MD;  Location: [facility];  Service: Orthopedics;  Laterality: Right;   Patient Active Problem List   Diagnosis Date Noted   Lumbar radiculopathy, right 02/27/2021   Posttraumatic headache 11/24/2020   Insomnia 06/21/2020   Major depression, single episode 06/21/2020   Migraine 06/21/2020   Posttraumatic stress disorder 06/21/2020    Anxiety 06/21/2020   Allergic rhinitis 06/21/2020   Hyperacusis of both ears 05/17/2020   Laryngopharyngeal reflux (LPR) 05/17/2020   Right rotator cuff tear 03/17/2020   Excessive physiologic tremor 02/11/2015   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/14/2014    REFERRING DIAG: M25.561,M25.562,G89.29 (ICD-10-CM) - Bilateral chronic knee pain  THERAPY DIAG:  Muscle weakness (generalized)  Chronic pain of left knee  Chronic pain of right knee  Balance problem  Unsteadiness on feet  Cramp and spasm  Dizziness and giddiness  Cervicalgia  PERTINENT HISTORY: 07/03/21 had bilateral ovaries removed secondary to cysts, Anxiety/Depression, Migraine, R shoulder arthroscopy with subacromial decompression on 04/11/20, BPPV  PRECAUTIONS: Fall  SUBJECTIVE: Pt reports that she is feeling better and has less pain     PAIN:  Are you having pain? Yes: NPRS scale: 3/10 Pain location: right side of back/hip Pain description: aching Aggravating factors: stairs Relieving factors: rest    PATIENT GOALS:  To be able to move more freely without having to think about it.  OBJECTIVE: (objective measures completed at initial evaluation unless otherwise dated)  DIAGNOSTIC FINDINGS:  Knee radiographs on 4/13 unremarkable   PATIENT SURVEYS:  09/12/2021 FOTO:  67%  FOTO 50% at initial evaluation (projected 61% by visit 11)   COGNITION:           Overall cognitive status: Within functional limits for tasks assessed  SENSATION: WFL   MUSCLE LENGTH: Hamstrings: Right 70 deg; Left 70 deg   POSTURE:  Slight forward head, slight rounded shoulders, decreased lumbar lordosis   PALPATION: Tender to palpation over left knee, minimal edema noted   LE ROM: Bilateral knee A/ROM 0-146 degrees   LE MMT:   MMT Right 07/28/2021 Right 09/05/2021 Left 07/28/2021  Hip flexion 3+ 4 4  Hip extension 3 3+ 4-  Hip abduction 3+ 4 4  Hip adduction       Hip internal rotation       Hip  external rotation       Knee flexion 4  5  Knee extension 4  5  Ankle dorsiflexion       Ankle plantarflexion       Ankle inversion       Ankle eversion        (Blank rows = not tested)   UE MMT: B shoulder strength of 4/5 grossly throughout On 09/12/2021:  B shoulder strength of 4 to 4+/5 grossly throughout   LOWER EXTREMITY SPECIAL TESTS:  Knee special tests: Anterior drawer test: negative and Posterior drawer test: negative   FUNCTIONAL TESTS:   07/28/2021: 5 times sit to stand: 17.4 sec without UE use Timed up and go (TUG): 7.9 sec   GAIT: Distance walked: 100 Assistive device utilized: None Level of assistance: Complete Independence Comments: Pt with antalgic gait noted with decreased weight bearing through RLE   VESTIBULAR ASSESSMENT: 08/01/2021:  Gilberto Better negative bilateral sides 08/22/2021:  Pt reports 2 second dizziness with left Gilberto Better, proceeded with Epley.  Negative Dix Hallpike on right.       TODAY'S TREATMENT  10/02/2021: Nustep level 5 x6 min (460 steps) with PT present to discuss status Seated with 5# ankle weights:  LAQ, marching, heel/toe raises, hip abduction scissors.  2x10 bilat Seated piriformis stretch 2x20 sec bilat Unilateral long sitting hamstring stretch 2x20 sec bilat Side stepping with yellow loop 3x10 ft bilat Fwd and backwards monster walk with yellow loop 3x10 ft each Fwd and lateral step ups to 6" step without UE support x10 bilat each Fwd partial lunge onto bosu 2x10 bilat with UE support of barre Gastroc stretch on half foam 2x20 sec Single leg stance 2x30 sec Heel walking down length of barre and back x2 laps Toe walking down length of barre and back x2 laps Grapevine walking 3 x 10 ft bilat Wall slides x10  09/27/2021: Nustep level 4 x6 min with PT present to discuss status Seated with 5# ankle weights:  LAQ, marching, heel/toe raises, hip abduction scissors.  2x10 bilat Unilateral long sitting hamstring stretch 2x20 sec  bilat Seated piriformis stretch 2x20 sec bilat Standing to half kneel with 2 cushions on floor for right side and 3 for left x10 each standing at barre for support Side stepping with yellow loop 3x10 ft bilat Fwd and backwards monster walk with yellow loop 3x10 ft each Fwd and lateral step ups to 6" step with UE support x10 bilat each Manual therapy:  soft tissue mobilization to right lumbar multifidi to promote tissue elongation  09/20/21: Pt arrives for aquatic physical therapy. Treatment took place in 3.5-5.5 feet of water. Water temperature was 91 degrees F . Pt entered the pool via stairs with mild use of rails. Pt requires buoyancy of water for support and to offload joints with strengthening exercises.   Seated water bench with 75% submersion Pt performed seated LE AROM exercises 20x in all planes,  concurrent review of status and pain assessment. Added ankle weights for LAQ 10x2 Bil. Standing in 50%- 75% depth water pt performed water walking in all 4 directions 10x. VC for speed in order to generate appropriate current for resistance and/or UE movements. Mitten hands added for side stepping.  Hip 3 ways 20x Bil still requires UE support for balance but added ankle weights today. High knee marching across pool 4 lengths holding onto blue noodle. Yellow noodle behind: Hip abd/add 2x10, then 8 min underwater bicycle.  Small noodle knee extensions 10x Bil. Step ups on water step 10x Bil no UE.  6/13: UBE level 2.0 x3 min each way Nustep with LE only level 5 x5 minutes (382 steps) with PT present to discuss status Seated with 2.5# ankle weights:  heel/toe raises plus 5# resting on knee;  LAQ, hip flexion up and over pool noodle 2x10 bilat Sit to stand holding 5# weight 3x2 Side stepping with 2.5# weights on ankles 5x; Fwd step ups to 6" step without UE use x10 bilat Standing  with 2.5# ankle weights:   step taps,  hip abduction, hip extension.  BLE 2x10 each Standing to 1/2 knee on 3 cushions  then progressed to 2 cushions and using step railings to assist to rise 2x 5  Climbing onto mat table, side sit push into quadruped and crawl off table to standing 2x5 On mat table quadruped to 1/2 kneel 5x each side Therapeutic activity: stairs, gettting of the floor      PATIENT EDUCATION:  Education details: Issued HEP Person educated: Patient Education method: Chief Technology Officer Education comprehension: verbalized understanding     HOME EXERCISE PROGRAM: Access Code: [email or web address] URL: [email or web address] Date: 07/28/2021 Prepared by: Clydie Braun Quashawn Jewkes   Exercises - Seated Scapular Retraction  - 1 x daily - 7 x weekly - 2 sets - 10 reps - Supine Bridge  - 1 x daily - 7 x weekly - 2 sets - 10 reps - Supine Active Straight Leg Raise  - 1 x daily - 7 x weekly - 2 sets - 10 reps - Sit to Stand with Arms Crossed  - 1 x daily - 7 x weekly - 2 sets - 10 reps - Seated Long Arc Quad with Hip Adduction  - 1 x daily - 7 x weekly - 2 sets - 10 reps   ASSESSMENT:   CLINICAL IMPRESSION:  Ms Raso presents to skilled rehabilitation with reports of feeling better after misstep on stairs prior to last visit.  Pt states that she is working to get a membership at [employer] to progress towards exercise after discharge from outpatient PT.  Pt reports that she is wanting to return to chair yoga classes, as she did previously.  Pt tolerates session well and able to work towards some increased balance/coordination as well as strengthening exercises.  Pt continues to require skilled PT to progress towards goal related activities.  OBJECTIVE IMPAIRMENTS decreased balance, difficulty walking, decreased strength, dizziness, increased muscle spasms, impaired UE functional use, postural dysfunction, and pain.    ACTIVITY LIMITATIONS cleaning, community activity, meal prep, and yard work.    PERSONAL FACTORS 3+ comorbidities: BPPV, R shoulder arthroscopy, Migraine  are also affecting  patient's functional outcome.      REHAB POTENTIAL: Good   CLINICAL DECISION MAKING: Evolving/moderate complexity   EVALUATION COMPLEXITY: Moderate     GOALS: Goals reviewed with patient? Yes   SHORT TERM GOALS: Target date: 08/18/2021   Pt will be independent with  initial HEP. Baseline: Goal status: Goal Met   2.  Pt will report a 30% improvement in symptoms. Baseline:  Goal status: Goal Met 09/05/2021     LONG TERM GOALS: Target date: 09/22/2021 (extended to 10/20/2021 at reeval)   Pt will be independent with advanced HEP. Baseline:  Goal status: Ongoing   2.  Pt's knee FOTO will increase to at least 61% to demonstrate improvements in functional mobility. Baseline: 50% Goal status: GOAL MET 09/12/2021 (67%)   3.  Pt will increase BUE and BLE strength to at least 4+/5 throughout to allow her to perform functional tasks in her home. Baseline:  Goal status: Ongoing (see above)   4.  Pt will report being able to reach into overhead cabinets to retrieve objects without increased pain/soreness. Baseline:  Goal status: Ongoing   5.  Pt will report at least a 75% improvement in dizziness. Baseline:  Goal status: Ongoing (denies dizziness on 09/12/2021)     PLAN: PT FREQUENCY: 2x/week   PT DURATION: additional 4 weeks   PLANNED INTERVENTIONS: Therapeutic exercises, Therapeutic activity, Neuromuscular re-education, Balance training, Gait training, Patient/Family education, Joint mobilization, Stair training, Vestibular training, Canalith repositioning, Aquatic Therapy, Dry Needling, Electrical stimulation, Spinal manipulation, Spinal mobilization, Cryotherapy, Moist heat, Taping, Vasopneumatic device, Ultrasound, Ionotophoresis 4mg /ml Dexamethasone, and Manual therapy   PLAN FOR NEXT SESSION: Strengthening, core stability/postural reeducation, balance, aquatic therapy as indicated   Reather Laurence, PT 10/02/21 1:27 PM   [facility] [address] [place], [place] [address] Phone # [phone] Fax [phone]

## 2021-10-03 NOTE — Therapy (Signed)
OUTPATIENT PHYSICAL THERAPY TREATMENT NOTE   Patient Name: Kristen [name] MRN: [id number] DOB:17-Oct-1960, 61 y.o., adult Today's Date: 10/04/2021  PCP: Percell Belt, DO REFERRING PROVIDER: Dr Sherene Sires     END OF SESSION:   PT End of Session - 10/04/21 0925     Visit Number 17    Date for PT Re-Evaluation 10/20/21    Authorization Type MCR    Progress Note Due on Visit 19    PT Start Time 0800    PT Stop Time 0850    PT Time Calculation (min) 50 min    Activity Tolerance Patient tolerated treatment well    Behavior During Therapy [facility] for tasks assessed/performed                     Past Medical History:  Diagnosis Date   B12 deficiency anemia    BPPV (benign paroxysmal positional vertigo)    Depression    GAD (generalized anxiety disorder)    History of Graves' disease 03/2006   s/p RAI   Hypertension    followed by pcp   Hypothyroidism, postablative 2007   endocrinologist---- dr Cruzita Lederer;   hx grave's s/p RAI 12/ 2007   Insomnia    Migraine    Peripheral neuropathy    Right ovarian cyst    Past Surgical History:  Procedure Laterality Date   COLONOSCOPY WITH PROPOFOL  04/2021   LAPAROSCOPIC BILATERAL SALPINGO OOPHERECTOMY Bilateral 07/03/2021   Procedure: LAPAROSCOPIC BILATERAL SALPINGO OOPHORECTOMY; PELVIC WASHINGS;  Surgeon: Armandina Stammer, DO;  Location: [place];  Service: Gynecology;  Laterality: Bilateral;   SHOULDER ARTHROSCOPY WITH SUBACROMIAL DECOMPRESSION Right 04/11/2020   Procedure: SHOULDER ARTHROSCOPY WITH SUBACROMIAL DECOMPRESSION;  Surgeon: Tania Ade, MD;  Location: [place];  Service: Orthopedics;  Laterality: Right;   Patient Active Problem List   Diagnosis Date Noted   Lumbar radiculopathy, right 02/27/2021   Posttraumatic headache 11/24/2020   Insomnia 06/21/2020   Major depression, single episode 06/21/2020   Migraine 06/21/2020   Posttraumatic stress disorder 06/21/2020    Anxiety 06/21/2020   Allergic rhinitis 06/21/2020   Hyperacusis of both ears 05/17/2020   Laryngopharyngeal reflux (LPR) 05/17/2020   Right rotator cuff tear 03/17/2020   Excessive physiologic tremor 02/11/2015   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 No current pain. My butt area is much better.     PAIN:  Are you having pain? No: NPRS scale: /10 Pain location:  Pain description: Aggravating factors:  Relieving factors:     PATIENT GOALS:  To be able to move more freely without having to think about it.  OBJECTIVE: (objective measures completed at initial evaluation unless otherwise dated)  DIAGNOSTIC FINDINGS:  Knee radiographs on 4/13 unremarkable   PATIENT SURVEYS:  09/12/2021 FOTO:  67%  FOTO 50% at initial evaluation (projected 61% by visit 11)   COGNITION:           Overall cognitive status: Within functional limits for tasks assessed  SENSATION: WFL   MUSCLE LENGTH: Hamstrings: Right 70 deg; Left 70 deg   POSTURE:  Slight forward head, slight rounded shoulders, decreased lumbar lordosis   PALPATION: Tender to palpation over left knee, minimal edema noted   LE ROM: Bilateral knee A/ROM 0-146 degrees   LE MMT:   MMT Right 07/28/2021 Right 09/05/2021 Left 07/28/2021  Hip flexion 3+ 4 4  Hip extension 3 3+ 4-  Hip abduction 3+ 4 4  Hip adduction       Hip internal rotation       Hip external rotation       Knee flexion 4  5  Knee extension 4  5  Ankle dorsiflexion       Ankle  plantarflexion       Ankle inversion       Ankle eversion        (Blank rows = not tested)   UE MMT: B shoulder strength of 4/5 grossly throughout On 09/12/2021:  B shoulder strength of 4 to 4+/5 grossly throughout   LOWER EXTREMITY SPECIAL TESTS:  Knee special tests: Anterior drawer test: negative and Posterior drawer test: negative   FUNCTIONAL TESTS:   07/28/2021: 5 times sit to stand: 17.4 sec without UE use Timed up and go (TUG): 7.9 sec   GAIT: Distance walked: 100 Assistive device utilized: None Level of assistance: Complete Independence Comments: Pt with antalgic gait noted with decreased weight bearing through RLE   VESTIBULAR ASSESSMENT: 08/01/2021:  [name] negative bilateral sides 08/22/2021:  Pt reports 2 second dizziness with left [name], proceeded with Epley.  Negative Dix Hallpike on right.       TODAY'S TREATMENT   10/04/21: Pt arrives for aquatic physical therapy. Treatment took place in 3.5-5.5 feet of water. Water temperature was 92 degrees F . Pt entered the pool via stairs with mild use of rails. Pt requires buoyancy of water for support and to offload joints with strengthening exercises.   Seated water bench with 75% submersion Pt performed seated LE AROM exercises 20x in all planes, concurrent review of status and pain assessment. Added ankle weights for LAQ 10x2 Bil. Standing in 50%- 75% depth water pt performed water walking  with running arms in all 4 directions 10x. VC for speed in order to generate appropriate current for resistance and/or UE movements. Mitten hands added for side stepping.  Hip 3 ways 20x Bil still requires UE support for balance ankle weights included today. High knee marching across pool 4 lengths holding onto water dumb bells. Hip abd/add 2x10 with yellow noodle behind pt then 8 min underwater bicycle.  Small noodle knee extensions 10x2 Bil. Step ups on water step 10x2 Bil no UE.  10/02/2021: Nustep level 5 x6 min (460  steps) with PT present to discuss status Seated with 5# ankle weights:  LAQ, marching, heel/toe raises, hip abduction scissors.  2x10 bilat Seated piriformis stretch 2x20 sec bilat Unilateral long sitting hamstring stretch 2x20 sec bilat Side stepping with yellow loop 3x10 ft bilat Fwd and backwards monster walk with yellow loop 3x10 ft each Fwd and lateral step ups to 6" step without UE support x10 bilat each Fwd partial lunge onto bosu 2x10 bilat with UE support of barre Gastroc stretch on half foam 2x20 sec Single leg stance 2x30 sec Heel walking down length of barre and back x2 laps Toe walking down length of barre and back x2 laps Grapevine walking 3 x 10 ft bilat Wall slides x10  09/27/2021:  Nustep level 4 x6 min with PT present to discuss status Seated with 5# ankle weights:  LAQ, marching, heel/toe raises, hip abduction scissors.  2x10 bilat Unilateral long sitting hamstring stretch 2x20 sec bilat Seated piriformis stretch 2x20 sec bilat Standing to half kneel with 2 cushions on floor for right side and 3 for left x10 each standing at barre for support Side stepping with yellow loop 3x10 ft bilat Fwd and backwards monster walk with yellow loop 3x10 ft each Fwd and lateral step ups to 6" step with UE support x10 bilat each Manual therapy:  soft tissue mobilization to right lumbar multifidi to promote tissue elongation  09/20/21: Pt arrives for aquatic physical therapy. Treatment took place in 3.5-5.5 feet of water. Water temperature was 91 degrees F . Pt entered the pool via stairs with mild use of rails. Pt requires buoyancy of water for support and to offload joints with strengthening exercises.   Seated water bench with 75% submersion Pt performed seated LE AROM exercises 20x in all planes, concurrent review of status and pain assessment. Added ankle weights for LAQ 10x2 Bil. Standing in 50%- 75% depth water pt performed water walking in all 4 directions 10x. VC for speed in  order to generate appropriate current for resistance and/or UE movements. Mitten hands added for side stepping.  Hip 3 ways 20x Bil still requires UE support for balance but added ankle weights today. High knee marching across pool 4 lengths holding onto blue noodle. Yellow noodle behind: Hip abd/add 2x10, then 8 min underwater bicycle.  Small noodle knee extensions 10x Bil. Step ups on water step 10x Bil no UE.  6/13: UBE level 2.0 x3 min each way Nustep with LE only level 5 x5 minutes (382 steps) with PT present to discuss status Seated with 2.5# ankle weights:  heel/toe raises plus 5# resting on knee;  LAQ, hip flexion up and over pool noodle 2x10 bilat Sit to stand holding 5# weight 3x2 Side stepping with 2.5# weights on ankles 5x; Fwd step ups to 6" step without UE use x10 bilat Standing  with 2.5# ankle weights:   step taps,  hip abduction, hip extension.  BLE 2x10 each Standing to 1/2 knee on 3 cushions then progressed to 2 cushions and using step railings to assist to rise 2x 5  Climbing onto mat table, side sit push into quadruped and crawl off table to standing 2x5 On mat table quadruped to 1/2 kneel 5x each side Therapeutic activity: stairs, gettting of the floor      PATIENT EDUCATION:  Education details: Issued HEP Person educated: Patient Education method: Theatre stage manager Education comprehension: verbalized understanding     HOME EXERCISE PROGRAM: Access Code: [email or web address] URL: [email or web address] Date: 07/28/2021 Prepared by: Shelby Dubin Menke   Exercises - Seated Scapular Retraction  - 1 x daily - 7 x weekly - 2 sets - 10 reps - Supine Bridge  - 1 x daily - 7 x weekly - 2 sets - 10 reps - Supine Active Straight Leg Raise  - 1 x daily - 7 x weekly - 2 sets - 10 reps - Sit to Stand with Arms Crossed  - 1 x daily - 7 x weekly - 2 sets - 10 reps - Seated Long Arc Quad with Hip Adduction  - 1 x daily - 7 x weekly - 2 sets - 10 reps   ASSESSMENT:    CLINICAL IMPRESSION:  Pt arrives for aquatic PT today with no current pain and  happy to be back in the water. Pt increased workload while not over working. No pain today while working.   OBJECTIVE IMPAIRMENTS decreased balance, difficulty walking, decreased strength, dizziness, increased muscle spasms, impaired UE functional use, postural dysfunction, and pain.    ACTIVITY LIMITATIONS cleaning, community activity, meal prep, and yard work.    PERSONAL FACTORS 3+ comorbidities: BPPV, R shoulder arthroscopy, Migraine  are also affecting patient's functional outcome.      REHAB POTENTIAL: Good   CLINICAL DECISION MAKING: Evolving/moderate complexity   EVALUATION COMPLEXITY: Moderate     GOALS: Goals reviewed with patient? Yes   SHORT TERM GOALS: Target date: 08/18/2021   Pt will be independent with initial HEP. Baseline: Goal status: Goal Met   2.  Pt will report a 30% improvement in symptoms. Baseline:  Goal status: Goal Met 09/05/2021     LONG TERM GOALS: Target date: 09/22/2021 (extended to 10/20/2021 at reeval)   Pt will be independent with advanced HEP. Baseline:  Goal status: Ongoing   2.  Pt's knee FOTO will increase to at least 61% to demonstrate improvements in functional mobility. Baseline: 50% Goal status: GOAL MET 09/12/2021 (67%)   3.  Pt will increase BUE and BLE strength to at least 4+/5 throughout to allow her to perform functional tasks in her home. Baseline:  Goal status: Ongoing (see above)   4.  Pt will report being able to reach into overhead cabinets to retrieve objects without increased pain/soreness. Baseline:  Goal status: Ongoing   5.  Pt will report at least a 75% improvement in dizziness. Baseline:  Goal status: Ongoing (denies dizziness on 09/12/2021)     PLAN: PT FREQUENCY: 2x/week   PT DURATION: additional 4 weeks   PLANNED INTERVENTIONS: Therapeutic exercises, Therapeutic activity, Neuromuscular re-education, Balance training, Gait  training, Patient/Family education, Joint mobilization, Stair training, Vestibular training, Canalith repositioning, Aquatic Therapy, Dry Needling, Electrical stimulation, Spinal manipulation, Spinal mobilization, Cryotherapy, Moist heat, Taping, Vasopneumatic device, Ultrasound, Ionotophoresis 68m/ml Dexamethasone, and Manual therapy   PLAN FOR NEXT SESSION: Strengthening, core stability/postural reeducation, balance, aquatic therapy as indicated   JMyrene Galas PTA 10/04/21 9:31 AM   [address] [place] [address]Phone # [phone] [phone]

## 2021-10-04 ENCOUNTER — Ambulatory Visit: Payer: Medicare Other | Admitting: Physical Therapy

## 2021-10-04 ENCOUNTER — Encounter: Payer: Self-pay | Admitting: Physical Therapy

## 2021-10-04 DIAGNOSIS — R2689 Other abnormalities of gait and mobility: Secondary | ICD-10-CM

## 2021-10-04 DIAGNOSIS — M6281 Muscle weakness (generalized): Secondary | ICD-10-CM | POA: Diagnosis not present

## 2021-10-04 DIAGNOSIS — G8929 Other chronic pain: Secondary | ICD-10-CM

## 2021-10-11 ENCOUNTER — Ambulatory Visit: Payer: Medicare Other | Admitting: Rehabilitative and Restorative Service Providers"

## 2021-10-12 NOTE — Therapy (Signed)
OUTPATIENT PHYSICAL THERAPY TREATMENT NOTE   Patient Name: Kristen [name] MRN: [id number] DOB:November 07, 1960, 61 y.o., adult Today's Date: 10/13/2021  PCP: Percell Belt, DO REFERRING PROVIDER: Dr Sherene Sires     END OF SESSION:   PT End of Session - 10/13/21 1817     Visit Number 18    Date for PT Re-Evaluation 10/20/21    Authorization Type MCR    Progress Note Due on Visit 19    PT Start Time 1435    PT Stop Time 1515    PT Time Calculation (min) 40 min    Activity Tolerance Patient tolerated treatment well    Behavior During Therapy [facility] for tasks assessed/performed                      Past Medical History:  Diagnosis Date   B12 deficiency anemia    BPPV (benign paroxysmal positional vertigo)    Depression    GAD (generalized anxiety disorder)    History of Graves' disease 03/2006   s/p RAI   Hypertension    followed by pcp   Hypothyroidism, postablative 2007   endocrinologist---- dr Cruzita Lederer;   hx grave's s/p RAI 12/ 2007   Insomnia    Migraine    Peripheral neuropathy    Right ovarian cyst    Past Surgical History:  Procedure Laterality Date   COLONOSCOPY WITH PROPOFOL  04/2021   LAPAROSCOPIC BILATERAL SALPINGO OOPHERECTOMY Bilateral 07/03/2021   Procedure: LAPAROSCOPIC BILATERAL SALPINGO OOPHORECTOMY; PELVIC WASHINGS;  Surgeon: Armandina Stammer, DO;  Location: [place];  Service: Gynecology;  Laterality: Bilateral;   SHOULDER ARTHROSCOPY WITH SUBACROMIAL DECOMPRESSION Right 04/11/2020   Procedure: SHOULDER ARTHROSCOPY WITH SUBACROMIAL DECOMPRESSION;  Surgeon: Tania Ade, MD;  Location: [place];  Service: Orthopedics;  Laterality: Right;   Patient Active Problem List   Diagnosis Date Noted   Lumbar radiculopathy, right 02/27/2021   Posttraumatic headache 11/24/2020   Insomnia 06/21/2020   Major depression, single episode 06/21/2020   Migraine 06/21/2020   Posttraumatic stress disorder 06/21/2020    Anxiety 06/21/2020   Allergic rhinitis 06/21/2020   Hyperacusis of both ears 05/17/2020   Laryngopharyngeal reflux (LPR) 05/17/2020   Right rotator cuff tear 03/17/2020   Excessive physiologic tremor 02/11/2015   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 No current pain. My butt area is much better.     PAIN:  Are you having pain? No: NPRS scale: /10 Pain location:  Pain description: Aggravating factors:  Relieving factors:     PATIENT GOALS:  To be able to move more freely without having to think about it.  OBJECTIVE: (objective measures completed at initial evaluation unless otherwise dated)  DIAGNOSTIC FINDINGS:  Knee radiographs on 4/13 unremarkable   PATIENT SURVEYS:  09/12/2021 FOTO:  67%  FOTO 50% at initial evaluation (projected 61% by visit 11)   COGNITION:           Overall cognitive status: Within functional limits for tasks assessed  SENSATION: WFL   MUSCLE LENGTH: Hamstrings: Right 70 deg; Left 70 deg   POSTURE:  Slight forward head, slight rounded shoulders, decreased lumbar lordosis   PALPATION: Tender to palpation over left knee, minimal edema noted   LE ROM: Bilateral knee A/ROM 0-146 degrees   LE MMT:   MMT Right 07/28/2021 Right 09/05/2021 Left 07/28/2021  Hip flexion 3+ 4 4  Hip extension 3 3+ 4-  Hip abduction 3+ 4 4  Hip adduction       Hip internal rotation       Hip external rotation       Knee flexion 4  5  Knee extension 4  5  Ankle dorsiflexion       Ankle  plantarflexion       Ankle inversion       Ankle eversion        (Blank rows = not tested)   UE MMT: B shoulder strength of 4/5 grossly throughout On 09/12/2021:  B shoulder strength of 4 to 4+/5 grossly throughout   LOWER EXTREMITY SPECIAL TESTS:  Knee special tests: Anterior drawer test: negative and Posterior drawer test: negative   FUNCTIONAL TESTS:   07/28/2021: 5 times sit to stand: 17.4 sec without UE use Timed up and go (TUG): 7.9 sec   GAIT: Distance walked: 100 Assistive device utilized: None Level of assistance: Complete Independence Comments: Pt with antalgic gait noted with decreased weight bearing through RLE   VESTIBULAR ASSESSMENT: 08/01/2021:  [name] negative bilateral sides 08/22/2021:  Pt reports 2 second dizziness with left [name], proceeded with Epley.  Negative Dix Hallpike on right.       TODAY'S TREATMENT  10/12/21: Pt arrives for aquatic physical therapy. Treatment took place in 3.5-5.5 feet of water. Water temperature was 92 degrees F . Pt entered the pool via stairs with mild use of rails. Pt requires buoyancy of water for support and to offload joints with strengthening exercises.   Seated water bench with 75% submersion Pt performed seated LE AROM exercises 20x in all planes, concurrent review of status and pain assessment. Added ankle weights for LAQ 10x2 Bil. Standing in 50%- 75% depth water pt performed water walking  with running arms in all 4 directions 10x. VC for speed in order to generate appropriate current for resistance and/or UE movements. Mitten hands added for side stepping.  Hip 3 ways 20x Bil still requires UE support for balance ankle weights included today. High knee marching across pool 4 lengths holding onto water dumb bells. Hip abd/add 2x10 with yellow noodle behind pt then 8 min underwater bicycle.  Small noodle knee extensions 10x2 Bil. Step ups on water step 10x2 Bil no UE.     10/04/21: Pt arrives for aquatic  physical therapy. Treatment took place in 3.5-5.5 feet of water. Water temperature was 92 degrees F . Pt entered the pool via stairs with mild use of rails. Pt requires buoyancy of water for support and to offload joints with strengthening exercises.   Seated water bench with 75% submersion Pt performed seated LE AROM exercises 20x in all planes, concurrent review of status and pain assessment. Added ankle weights for LAQ 10x2 Bil. Standing in 50%- 75% depth water pt performed water walking  with running arms in all 4 directions 10x. VC for speed in order to generate appropriate current for resistance and/or UE movements. Mitten hands added for side stepping.  Hip 3 ways 20x Bil still requires UE support for balance ankle  weights included today. High knee marching across pool 4 lengths holding onto water dumb bells. Hip abd/add 2x10 with yellow noodle behind pt then 8 min underwater bicycle.  Small noodle knee extensions 10x2 Bil. Step ups on water step 10x2 Bil no UE.  10/02/2021: Nustep level 5 x6 min (460 steps) with PT present to discuss status Seated with 5# ankle weights:  LAQ, marching, heel/toe raises, hip abduction scissors.  2x10 bilat Seated piriformis stretch 2x20 sec bilat Unilateral long sitting hamstring stretch 2x20 sec bilat Side stepping with yellow loop 3x10 ft bilat Fwd and backwards monster walk with yellow loop 3x10 ft each Fwd and lateral step ups to 6" step without UE support x10 bilat each Fwd partial lunge onto bosu 2x10 bilat with UE support of barre Gastroc stretch on half foam 2x20 sec Single leg stance 2x30 sec Heel walking down length of barre and back x2 laps Toe walking down length of barre and back x2 laps Grapevine walking 3 x 10 ft bilat Wall slides x10  09/27/2021: Nustep level 4 x6 min with PT present to discuss status Seated with 5# ankle weights:  LAQ, marching, heel/toe raises, hip abduction scissors.  2x10 bilat Unilateral long sitting hamstring  stretch 2x20 sec bilat Seated piriformis stretch 2x20 sec bilat Standing to half kneel with 2 cushions on floor for right side and 3 for left x10 each standing at barre for support Side stepping with yellow loop 3x10 ft bilat Fwd and backwards monster walk with yellow loop 3x10 ft each Fwd and lateral step ups to 6" step with UE support x10 bilat each Manual therapy:  soft tissue mobilization to right lumbar multifidi to promote tissue elongation  09/20/21: Pt arrives for aquatic physical therapy. Treatment took place in 3.5-5.5 feet of water. Water temperature was 91 degrees F . Pt entered the pool via stairs with mild use of rails. Pt requires buoyancy of water for support and to offload joints with strengthening exercises.   Seated water bench with 75% submersion Pt performed seated LE AROM exercises 20x in all planes, concurrent review of status and pain assessment. Added ankle weights for LAQ 10x2 Bil. Standing in 50%- 75% depth water pt performed water walking in all 4 directions 10x. VC for speed in order to generate appropriate current for resistance and/or UE movements. Mitten hands added for side stepping.  Hip 3 ways 20x Bil still requires UE support for balance but added ankle weights today. High knee marching across pool 4 lengths holding onto blue noodle. Yellow noodle behind: Hip abd/add 2x10, then 8 min underwater bicycle.  Small noodle knee extensions 10x Bil. Step ups on water step 10x Bil no UE.      PATIENT EDUCATION:  Education details: Issued HEP Person educated: Patient Education method: Theatre stage manager Education comprehension: verbalized understanding     HOME EXERCISE PROGRAM: Access Code: [email or web address] URL: [email or web address] Date: 07/28/2021 Prepared by: Shelby Dubin Menke   Exercises - Seated Scapular Retraction  - 1 x daily - 7 x weekly - 2 sets - 10 reps - Supine Bridge  - 1 x daily - 7 x weekly - 2 sets - 10 reps - Supine Active Straight  Leg Raise  - 1 x daily - 7 x weekly - 2 sets - 10 reps - Sit to Stand with Arms Crossed  - 1 x daily - 7 x weekly - 2 sets - 10 reps - Seated Long Arc Quad with Hip Adduction  - 1 x daily -  7 x weekly - 2 sets - 10 reps   ASSESSMENT:   CLINICAL IMPRESSION:  Pt arrives for aquatic PT today with no current pain and happy to be back in the water. Pt increased workload while not over working. No pain today while working. Pt is prepared for DC next week from PT to the [facility] where she plans to use the pool intermittently with structured classes.  OBJECTIVE IMPAIRMENTS decreased balance, difficulty walking, decreased strength, dizziness, increased muscle spasms, impaired UE functional use, postural dysfunction, and pain.    ACTIVITY LIMITATIONS cleaning, community activity, meal prep, and yard work.    PERSONAL FACTORS 3+ comorbidities: BPPV, R shoulder arthroscopy, Migraine  are also affecting patient's functional outcome.      REHAB POTENTIAL: Good   CLINICAL DECISION MAKING: Evolving/moderate complexity   EVALUATION COMPLEXITY: Moderate     GOALS: Goals reviewed with patient? Yes   SHORT TERM GOALS: Target date: 08/18/2021   Pt will be independent with initial HEP. Baseline: Goal status: Goal Met   2.  Pt will report a 30% improvement in symptoms. Baseline:  Goal status: Goal Met 09/05/2021     LONG TERM GOALS: Target date: 09/22/2021 (extended to 10/20/2021 at reeval)   Pt will be independent with advanced HEP. Baseline:  Goal status: Ongoing   2.  Pt's knee FOTO will increase to at least 61% to demonstrate improvements in functional mobility. Baseline: 50% Goal status: GOAL MET 09/12/2021 (67%)   3.  Pt will increase BUE and BLE strength to at least 4+/5 throughout to allow her to perform functional tasks in her home. Baseline:  Goal status: Ongoing (see above)   4.  Pt will report being able to reach into overhead cabinets to retrieve objects without increased  pain/soreness. Baseline:  Goal status: Ongoing   5.  Pt will report at least a 75% improvement in dizziness. Baseline:  Goal status: Ongoing (denies dizziness on 09/12/2021)     PLAN: PT FREQUENCY: 2x/week   PT DURATION: additional 4 weeks   PLANNED INTERVENTIONS: Therapeutic exercises, Therapeutic activity, Neuromuscular re-education, Balance training, Gait training, Patient/Family education, Joint mobilization, Stair training, Vestibular training, Canalith repositioning, Aquatic Therapy, Dry Needling, Electrical stimulation, Spinal manipulation, Spinal mobilization, Cryotherapy, Moist heat, Taping, Vasopneumatic device, Ultrasound, Ionotophoresis 14m/ml Dexamethasone, and Manual therapy   PLAN FOR NEXT SESSION: DC next week  JMyrene Galas PTA 10/13/21 6:18 PM   [facility] [address] [place] [place] [address]Phone # [phone] [phone]

## 2021-10-13 ENCOUNTER — Encounter: Payer: Self-pay | Admitting: Physical Therapy

## 2021-10-13 ENCOUNTER — Ambulatory Visit: Payer: Medicare Other | Attending: Family Medicine | Admitting: Physical Therapy

## 2021-10-13 DIAGNOSIS — M542 Cervicalgia: Secondary | ICD-10-CM | POA: Diagnosis present

## 2021-10-13 DIAGNOSIS — R2681 Unsteadiness on feet: Secondary | ICD-10-CM | POA: Insufficient documentation

## 2021-10-13 DIAGNOSIS — R42 Dizziness and giddiness: Secondary | ICD-10-CM | POA: Insufficient documentation

## 2021-10-13 DIAGNOSIS — M25562 Pain in left knee: Secondary | ICD-10-CM | POA: Diagnosis present

## 2021-10-13 DIAGNOSIS — R252 Cramp and spasm: Secondary | ICD-10-CM | POA: Diagnosis present

## 2021-10-13 DIAGNOSIS — R2689 Other abnormalities of gait and mobility: Secondary | ICD-10-CM | POA: Diagnosis present

## 2021-10-13 DIAGNOSIS — M25561 Pain in right knee: Secondary | ICD-10-CM | POA: Insufficient documentation

## 2021-10-13 DIAGNOSIS — G8929 Other chronic pain: Secondary | ICD-10-CM | POA: Insufficient documentation

## 2021-10-13 DIAGNOSIS — M6281 Muscle weakness (generalized): Secondary | ICD-10-CM | POA: Insufficient documentation

## 2021-10-18 ENCOUNTER — Encounter: Payer: Self-pay | Admitting: Rehabilitative and Restorative Service Providers"

## 2021-10-18 ENCOUNTER — Ambulatory Visit: Payer: Medicare Other | Admitting: Rehabilitative and Restorative Service Providers"

## 2021-10-18 DIAGNOSIS — M6281 Muscle weakness (generalized): Secondary | ICD-10-CM

## 2021-10-18 DIAGNOSIS — R2681 Unsteadiness on feet: Secondary | ICD-10-CM

## 2021-10-18 DIAGNOSIS — R42 Dizziness and giddiness: Secondary | ICD-10-CM

## 2021-10-18 DIAGNOSIS — M542 Cervicalgia: Secondary | ICD-10-CM

## 2021-10-18 DIAGNOSIS — R252 Cramp and spasm: Secondary | ICD-10-CM

## 2021-10-18 DIAGNOSIS — G8929 Other chronic pain: Secondary | ICD-10-CM

## 2021-10-18 DIAGNOSIS — R2689 Other abnormalities of gait and mobility: Secondary | ICD-10-CM

## 2021-10-18 NOTE — Therapy (Signed)
OUTPATIENT PHYSICAL THERAPY TREATMENT NOTE AND DISCHARGE SUMMARY   Patient Name: Kristen [name] MRN: [id number] DOB:1960/07/23, 61 y.o., adult Today's Date: 10/18/2021  PCP: Kristen Belt, DO REFERRING PROVIDER: Dr Kristen [name]     END OF SESSION:   PT End of Session - 10/18/21 1105     Visit Number 19    Date for PT Re-Evaluation 10/20/21    Authorization Type MCR    PT Start Time 1100    PT Stop Time 1140    PT Time Calculation (min) 40 min    Activity Tolerance Patient tolerated treatment well    Behavior During Therapy [facility] for tasks assessed/performed                      Past Medical History:  Diagnosis Date   B12 deficiency anemia    BPPV (benign paroxysmal positional vertigo)    Depression    GAD (generalized anxiety disorder)    History of Graves' disease 03/2006   s/p RAI   Hypertension    followed by pcp   Hypothyroidism, postablative 2007   endocrinologist---- dr Kristen [name];   hx grave's s/p RAI 12/ 2007   Insomnia    Migraine    Peripheral neuropathy    Right ovarian cyst    Past Surgical History:  Procedure Laterality Date   COLONOSCOPY WITH PROPOFOL  04/2021   LAPAROSCOPIC BILATERAL SALPINGO OOPHERECTOMY Bilateral 07/03/2021   Procedure: LAPAROSCOPIC BILATERAL SALPINGO OOPHORECTOMY; PELVIC WASHINGS;  Surgeon: Kristen Stammer, DO;  Location: [place];  Service: Gynecology;  Laterality: Bilateral;   SHOULDER ARTHROSCOPY WITH SUBACROMIAL DECOMPRESSION Right 04/11/2020   Procedure: SHOULDER ARTHROSCOPY WITH SUBACROMIAL DECOMPRESSION;  Surgeon: Kristen Ade, MD;  Location: [place];  Service: Orthopedics;  Laterality: Right;   Patient Active Problem List   Diagnosis Date Noted   Lumbar radiculopathy, right 02/27/2021   Posttraumatic headache 11/24/2020   Insomnia 06/21/2020   Major depression, single episode 06/21/2020   Migraine 06/21/2020   Posttraumatic stress disorder 06/21/2020   Anxiety  06/21/2020   Allergic rhinitis 06/21/2020   Hyperacusis of both ears 05/17/2020   Laryngopharyngeal reflux (LPR) 05/17/2020   Right rotator cuff tear 03/17/2020   Excessive physiologic tremor 02/11/2015   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  Reports overall dizziness is at least 90% better.  Pt states that she was putting away dishes and holding 4 at a time to put away without pain.  She realized that she had made progress and no longer needed to put only one dish away at a time.    PAIN:  Are you having pain? Yes: NPRS scale: 0-2/10 Pain location: cervical Pain description: dull soreness Aggravating factors: first thing in the AM Relieving factors: stretches    PATIENT GOALS:  To be able to move more freely without having to think about it.  OBJECTIVE: (objective measures completed at initial evaluation unless otherwise dated)  DIAGNOSTIC FINDINGS:  Knee radiographs on 4/13 unremarkable   PATIENT SURVEYS:  09/12/2021 FOTO:  67%  FOTO 50% at initial evaluation (projected 61% by visit 11)   COGNITION:           Overall cognitive status: Within functional limits for tasks assessed                          SENSATION: WFL   MUSCLE LENGTH: Hamstrings: Right 70 deg; Left 70 deg   POSTURE:  Slight forward head, slight rounded shoulders, decreased lumbar lordosis    PALPATION: Tender to palpation over left knee, minimal edema noted   LE ROM: Bilateral knee A/ROM 0-146 degrees   LE MMT:   MMT Right 07/28/2021 Right 09/05/2021 Right 10/18/21 Left 07/28/2021 Left 10/18/21  Hip flexion 3+ 4 4+ 4 4+  Hip extension 3 3+ 4+ 4- 4+  Hip abduction 3+ 4 4+ 4 5+  Hip adduction         Hip internal rotation         Hip external rotation         Knee flexion _0 Knee extension _1 Ankle dorsiflexion         Ankle plantarflexion         Ankle inversion         Ankle eversion          (Blank rows = not tested)   UE MMT: B shoulder strength of 4/5 grossly throughout On 09/12/2021:  B shoulder strength of 4 to 4+/5 grossly throughout On 10/18/2021:  B shoulder strength of 5/5 throughout   LOWER EXTREMITY SPECIAL TESTS:  Knee special tests: Anterior drawer test: negative and Posterior drawer test: negative   FUNCTIONAL TESTS:   07/28/2021: 5 times sit to stand: 17.4 sec without UE use Timed up and go (TUG): 7.9 sec  10/18/2021: 5 times sit to stand: 9.0 sec without UE use Timed up and go (TUG): 5.5 sec   GAIT: Distance walked: 100 Assistive device utilized: None Level of assistance: Complete Independence Comments: Pt with antalgic gait noted with decreased weight bearing through RLE   VESTIBULAR ASSESSMENT: 08/01/2021:  [name] negative bilateral sides 08/22/2021:  Pt reports 2 second dizziness with left [name], proceeded with Epley.  Negative Dix Hallpike on right.       TODAY'S TREATMENT:  10/18/2021: UBE level 1.5 x2 min fwd/back each, then x1 min each direction with cuing to go faster.  PT present to discuss status. Reaching into overhead cabinet and back with 3# 2x10 bilat (denies pain) Seated piriformis stretch 2x20 sec bilat Unilateral long sitting hamstring stretch 2x20 sec bilat Standing:  hip abduction, hip extension, hip flexion.  BLE 2x10 with yellow loop Counter stretch at barre 2x20 sec FWD step ups 6"  without UE 2x10 bilat  10/12/21: Pt arrives for aquatic physical therapy. Treatment took place in 3.5-5.5 feet of water. Water temperature was 92 degrees F . Pt entered the pool via stairs with mild use of rails. Pt requires buoyancy of water for support and to offload joints with strengthening exercises.   Seated water bench with 75% submersion Pt performed seated LE AROM exercises 20x in all planes, concurrent review of status and pain assessment. Added ankle weights for LAQ 10x2 Bil. Standing in 50%- 75% depth water pt performed water walking  with running arms in all 4 directions 10x. VC for speed in order  to generate appropriate current for resistance and/or UE movements. Mitten hands added for side stepping.  Hip 3 ways 20x Bil still requires UE support for balance ankle weights included today. High knee marching across pool 4 lengths holding onto water dumb bells. Hip abd/add 2x10 with yellow noodle behind pt then 8 min underwater bicycle.  Small noodle knee extensions 10x2 Bil. Step ups on water step 10x2 Bil no UE.    PATIENT EDUCATION:  Education details: Issued HEP Person educated: Patient Education method: Theatre stage manager Education comprehension: verbalized understanding     HOME EXERCISE PROGRAM: Access Code: [email or web address] URL: [email or web address] Date: 07/28/2021 Prepared by: Shelby Dubin Ariane Ditullio   Exercises - Seated Scapular Retraction  - 1 x daily - 7 x weekly - 2 sets - 10 reps - Supine Bridge  - 1 x daily - 7 x weekly - 2 sets - 10 reps - Supine Active Straight Leg Raise  - 1 x daily - 7 x weekly - 2 sets - 10 reps - Sit to Stand with Arms Crossed  - 1 x daily - 7 x weekly - 2 sets - 10 reps - Seated Long Arc Quad with Hip Adduction  - 1 x daily - 7 x weekly - 2 sets - 10 reps   ASSESSMENT:   CLINICAL IMPRESSION:  Ms Noyce presents to skilled rehabilitation reporting great improvements since initial evaluation and that she is joining [employer].  Pt is now  able to perform tasks at home without increased pain and has been having only occasional dizziness that lasts for 1-2 sec.  Pt with improved time noted with 5 times sit to/from stand and TUG.  Pt has met all PT goals and is ready for discharge from skilled PT today to continue with HEP and classes at [facility].  OBJECTIVE IMPAIRMENTS decreased balance, difficulty walking, decreased strength, dizziness, increased muscle spasms, impaired UE functional use, postural dysfunction, and pain.    ACTIVITY LIMITATIONS cleaning, community activity, meal prep, and yard work.    PERSONAL FACTORS 3+ comorbidities: BPPV, R shoulder arthroscopy, Migraine  are also affecting patient's functional outcome.      REHAB POTENTIAL: Good   CLINICAL DECISION MAKING: Evolving/moderate complexity   EVALUATION COMPLEXITY: Moderate     GOALS: Goals reviewed with patient? Yes   SHORT TERM GOALS: Target date: 08/18/2021   Pt will be independent with initial HEP. Baseline: Goal status: Goal Met   2.  Pt will report a 30% improvement in symptoms. Baseline:  Goal status: Goal Met 09/05/2021     LONG TERM GOALS: Target date: 09/22/2021 (extended to 10/20/2021 at reeval)   Pt will be independent with advanced HEP. Baseline:  Goal status: Goal Met 7/12 (purchased leg weights to continue HEP and joining [employer])   2.  Pt's knee FOTO will increase to at least 61% to demonstrate improvements in functional mobility. Baseline: 50% Goal status: GOAL MET 09/12/2021 (67%)   3.  Pt will increase BUE and BLE strength to at least 4+/5 throughout to allow her to perform functional tasks in her home. Baseline:  Goal status: Goal Met 10/18/21   4.  Pt will report being able to reach into overhead cabinets to retrieve objects without increased pain/soreness. Baseline:  Goal status: Goal Met 10/18/21   5.  Pt will report at least a 75% improvement in dizziness. Baseline:  Goal status: Goal Met 10/18/21     PLAN: PT FREQUENCY:  2x/week   PT DURATION: additional 4 weeks   PLANNED INTERVENTIONS:  Therapeutic exercises, Therapeutic activity, Neuromuscular re-education, Balance training, Gait training, Patient/Family education, Joint mobilization, Stair training, Vestibular training, Canalith repositioning, Aquatic Therapy, Dry Needling, Electrical stimulation, Spinal manipulation, Spinal mobilization, Cryotherapy, Moist heat, Taping, Vasopneumatic device, Ultrasound, Ionotophoresis 78m/ml Dexamethasone, and Manual therapy   PLAN FOR NEXT SESSION: N/A, discharge completed on 10/18/21   PHYSICAL THERAPY DISCHARGE SUMMARY   Patient agrees to discharge. Patient goals were met. Patient is being discharged due to meeting the stated rehab goals.     SJuel Burrow PT 10/18/21 11:07 AM   [address] [place] [address]Phone # [phone] [phone]

## 2021-10-20 ENCOUNTER — Ambulatory Visit: Payer: Medicare Other | Admitting: Physical Therapy

## 2021-10-20 NOTE — Therapy (Deleted)
OUTPATIENT PHYSICAL THERAPY TREATMENT NOTE AND DISCHARGE SUMMARY   Patient Name: Kristen [name] MRN: [id number] DOB:October 05, 1960, 61 y.o., adult Today's Date: 10/20/2021  PCP: Percell Belt, DO REFERRING PROVIDER: Dr Sherene Sires     END OF SESSION:              Past Medical History:  Diagnosis Date   B12 deficiency anemia    BPPV (benign paroxysmal positional vertigo)    Depression    GAD (generalized anxiety disorder)    History of Graves' disease 03/2006   s/p RAI   Hypertension    followed by pcp   Hypothyroidism, postablative 2007   endocrinologist---- dr Cruzita Lederer;   hx grave's s/p RAI 12/ 2007   Insomnia    Migraine    Peripheral neuropathy    Right ovarian cyst    Past Surgical History:  Procedure Laterality Date   COLONOSCOPY WITH PROPOFOL  04/2021   LAPAROSCOPIC BILATERAL SALPINGO OOPHERECTOMY Bilateral 07/03/2021   Procedure: LAPAROSCOPIC BILATERAL SALPINGO OOPHORECTOMY; PELVIC WASHINGS;  Surgeon: Armandina Stammer, DO;  Location: [place];  Service: Gynecology;  Laterality: Bilateral;   SHOULDER ARTHROSCOPY WITH SUBACROMIAL DECOMPRESSION Right 04/11/2020   Procedure: SHOULDER ARTHROSCOPY WITH SUBACROMIAL DECOMPRESSION;  Surgeon: Tania Ade, MD;  Location: [place];  Service: Orthopedics;  Laterality: Right;   Patient Active Problem List   Diagnosis Date Noted   Lumbar radiculopathy, right 02/27/2021   Posttraumatic headache 11/24/2020   Insomnia 06/21/2020   Major depression, single episode 06/21/2020   Migraine 06/21/2020   Posttraumatic stress disorder 06/21/2020   Anxiety 06/21/2020   Allergic rhinitis 06/21/2020   Hyperacusis of both ears 05/17/2020   Laryngopharyngeal reflux (LPR) 05/17/2020   Right rotator cuff tear 03/17/2020   Excessive physiologic tremor 02/11/2015   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/14/2014    REFERRING DIAG: M25.561,M25.562,G89.29 (ICD-10-CM) - Bilateral chronic knee  pain  THERAPY DIAG:  Muscle weakness (generalized)  Chronic pain of left knee  Balance problem  Chronic pain of right knee  Unsteadiness on feet  PERTINENT HISTORY: 07/03/21 had bilateral ovaries removed secondary to cysts, Anxiety/Depression, Migraine, R shoulder arthroscopy with subacromial decompression on 04/11/20, BPPV  PRECAUTIONS: Fall  SUBJECTIVE: Pt reports that she got a little dizzy for a couple of seconds this morning, but it quickly went away.  Reports overall dizziness is at least 90% better.  Pt states that she was putting away dishes and holding 4 at a time to put away without pain.  She realized that she had made progress and no longer needed to put only one dish away at a time.    PAIN:  Are you having pain? Yes: NPRS scale: 0-2/10 Pain location: cervical Pain description: dull soreness Aggravating factors: first thing in the AM Relieving factors: stretches    PATIENT GOALS:  To be able to move more freely without having to think about it.  OBJECTIVE: (objective measures completed at initial evaluation unless otherwise dated)  DIAGNOSTIC FINDINGS:  Knee radiographs on 4/13 unremarkable   PATIENT SURVEYS:  09/12/2021 FOTO:  67%  FOTO 50% at initial evaluation (projected 61% by visit 11)   COGNITION:           Overall cognitive status: Within functional limits for tasks assessed                          SENSATION: WFL   MUSCLE LENGTH: Hamstrings: Right 70 deg; Left 70 deg   POSTURE:  Slight  forward head, slight rounded shoulders, decreased lumbar lordosis   PALPATION: Tender to palpation over left knee, minimal edema noted   LE ROM: Bilateral knee A/ROM 0-146 degrees   LE MMT:   MMT Right 07/28/2021 Right 09/05/2021 Right 10/18/21 Left 07/28/2021 Left 10/18/21  Hip flexion 3+ 4 4+ 4 4+  Hip extension 3 3+ 4+ 4- 4+  Hip abduction 3+ 4 4+ 4 5+  Hip adduction         Hip internal rotation         Hip external rotation         Knee flexion $RemoveBefo'4  5  5 5  'phSdnoklXRr$ Knee extension $RemoveBefore'4  5 5 5  'NLUafsRZvEnrz$ Ankle dorsiflexion         Ankle plantarflexion         Ankle inversion         Ankle eversion          (Blank rows = not tested)   UE MMT: B shoulder strength of 4/5 grossly throughout On 09/12/2021:  B shoulder strength of 4 to 4+/5 grossly throughout On 10/18/2021:  B shoulder strength of 5/5 throughout   LOWER EXTREMITY SPECIAL TESTS:  Knee special tests: Anterior drawer test: negative and Posterior drawer test: negative   FUNCTIONAL TESTS:   07/28/2021: 5 times sit to stand: 17.4 sec without UE use Timed up and go (TUG): 7.9 sec  10/18/2021: 5 times sit to stand: 9.0 sec without UE use Timed up and go (TUG): 5.5 sec   GAIT: Distance walked: 100 Assistive device utilized: None Level of assistance: Complete Independence Comments: Pt with antalgic gait noted with decreased weight bearing through RLE   VESTIBULAR ASSESSMENT: 08/01/2021:  [name] negative bilateral sides 08/22/2021:  Pt reports 2 second dizziness with left [name], proceeded with [place].  Negative Dix Hallpike on right.       TODAY'S TREATMENT:  10/18/2021: UBE level 1.5 x2 min fwd/back each, then x1 min each direction with cuing to go faster.  PT present to discuss status. Reaching into overhead cabinet and back with 3# 2x10 bilat (denies pain) Seated piriformis stretch 2x20 sec bilat Unilateral long sitting hamstring stretch 2x20 sec bilat Standing:  hip abduction, hip extension, hip flexion.  BLE 2x10 with yellow loop Counter stretch at barre 2x20 sec FWD step ups 6" without UE 2x10 bilat  10/12/21: Pt arrives for aquatic physical therapy. Treatment took place in 3.5-5.5 feet of water. Water temperature was 92 degrees F . Pt entered the pool via stairs with mild use of rails. Pt requires buoyancy of water for support and to offload joints with strengthening exercises.   Seated water bench with 75% submersion Pt performed seated LE AROM exercises 20x in all planes,  concurrent review of status and pain assessment. Added ankle weights for LAQ 10x2 Bil. Standing in 50%- 75% depth water pt performed water walking  with running arms in all 4 directions 10x. VC for speed in order to generate appropriate current for resistance and/or UE movements. Mitten hands added for side stepping.  Hip 3 ways 20x Bil still requires UE support for balance ankle weights included today. High knee marching across pool 4 lengths holding onto water dumb bells. Hip abd/add 2x10 with yellow noodle behind pt then 8 min underwater bicycle.  Small noodle knee extensions 10x2 Bil. Step ups on water step 10x2 Bil no UE.    PATIENT EDUCATION:  Education details: Issued HEP Person educated: Patient Education method: Explanation and Handouts  Education comprehension: verbalized understanding     HOME EXERCISE PROGRAM: Access Code: [email or web address] URL: [email or web address] Date: 07/28/2021 Prepared by: Shelby Dubin Menke   Exercises - Seated Scapular Retraction  - 1 x daily - 7 x weekly - 2 sets - 10 reps - Supine Bridge  - 1 x daily - 7 x weekly - 2 sets - 10 reps - Supine Active Straight Leg Raise  - 1 x daily - 7 x weekly - 2 sets - 10 reps - Sit to Stand with Arms Crossed  - 1 x daily - 7 x weekly - 2 sets - 10 reps - Seated Long Arc Quad with Hip Adduction  - 1 x daily - 7 x weekly - 2 sets - 10 reps   ASSESSMENT:   CLINICAL IMPRESSION:  Ms Rief presents to skilled rehabilitation reporting great improvements since initial evaluation and that she is joining [employer].  Pt is now able to perform tasks at home without increased pain and has been having only occasional dizziness that lasts for 1-2 sec.  Pt with improved time noted with 5 times sit to/from stand and TUG.  Pt has met all PT goals and is ready for discharge from skilled PT today to continue with HEP and classes at [facility].  OBJECTIVE IMPAIRMENTS decreased balance, difficulty walking, decreased strength, dizziness,  increased muscle spasms, impaired UE functional use, postural dysfunction, and pain.    ACTIVITY LIMITATIONS cleaning, community activity, meal prep, and yard work.    PERSONAL FACTORS 3+ comorbidities: BPPV, R shoulder arthroscopy, Migraine  are also affecting patient's functional outcome.      REHAB POTENTIAL: Good   CLINICAL DECISION MAKING: Evolving/moderate complexity   EVALUATION COMPLEXITY: Moderate     GOALS: Goals reviewed with patient? Yes   SHORT TERM GOALS: Target date: 08/18/2021   Pt will be independent with initial HEP. Baseline: Goal status: Goal Met   2.  Pt will report a 30% improvement in symptoms. Baseline:  Goal status: Goal Met 09/05/2021     LONG TERM GOALS: Target date: 09/22/2021 (extended to 10/20/2021 at reeval)   Pt will be independent with advanced HEP. Baseline:  Goal status: Goal Met 7/12 (purchased leg weights to continue HEP and joining [employer])   2.  Pt's knee FOTO will increase to at least 61% to demonstrate improvements in functional mobility. Baseline: 50% Goal status: GOAL MET 09/12/2021 (67%)   3.  Pt will increase BUE and BLE strength to at least 4+/5 throughout to allow her to perform functional tasks in her home. Baseline:  Goal status: Goal Met 10/18/21   4.  Pt will report being able to reach into overhead cabinets to retrieve objects without increased pain/soreness. Baseline:  Goal status: Goal Met 10/18/21   5.  Pt will report at least a 75% improvement in dizziness. Baseline:  Goal status: Goal Met 10/18/21     PLAN: PT FREQUENCY: 2x/week   PT DURATION: additional 4 weeks   PLANNED INTERVENTIONS: Therapeutic exercises, Therapeutic activity, Neuromuscular re-education, Balance training, Gait training, Patient/Family education, Joint mobilization, Stair training, Vestibular training, Canalith repositioning, Aquatic Therapy, Dry Needling, Electrical stimulation, Spinal manipulation, Spinal mobilization, Cryotherapy, Moist heat,  Taping, Vasopneumatic device, Ultrasound, Ionotophoresis 4mg /ml Dexamethasone, and Manual therapy   PLAN FOR NEXT SESSION: N/A, discharge completed on 10/18/21   PHYSICAL THERAPY DISCHARGE SUMMARY   Patient agrees to discharge. Patient goals were met. Patient is being discharged due to meeting the stated rehab goals.     Myrene Galas, PTA 10/20/21 7:43  AM   [facility] [address] [place], [place] [address] Phone # [phone] Fax [phone]

## 2021-10-24 ENCOUNTER — Other Ambulatory Visit (INDEPENDENT_AMBULATORY_CARE_PROVIDER_SITE_OTHER): Payer: Medicare Other

## 2021-10-24 DIAGNOSIS — E890 Postprocedural hypothyroidism: Secondary | ICD-10-CM | POA: Diagnosis not present

## 2021-10-24 LAB — TSH: TSH: 1.49 u[IU]/mL (ref 0.35–5.50)

## 2021-10-24 LAB — T4, FREE: Free T4: 1.11 ng/dL (ref 0.60–1.60)

## 2022-01-01 NOTE — Progress Notes (Deleted)
$'@Patient'B$  ID: Kristen [name], adult    DOB: [birth year], 61 y.o.   MRN: [id number]  No chief complaint on file.   Referring provider: Percell Belt, DO  HPI: 61 year old female, never smoked.  Past medical history significant for LPR, allergic rhinitis, [place] septal repair, hypothyroidism, tremor, TSD, major depression, insomnia, migraine headaches.  Patient of Dr. Verlee [name], seen for initial consult on 12/02/2020 for dyspnea on exertion.  01/02/2022 Patient presents today for acute office visit.  She was seen for initial consult back in August 2020 by Dr. Bjorn [name] for dyspnea on exertion.  Unclear etiology.  Difficult to ascertain a clear history patient.  She was reported to be adherent with PPI at that time.  She was advised to start Flonase and nasal irrigations for postnasal drip and mucoid secretions due to previous nasal surgery.  She had some air trapping on pulmonary function testing, no evidence of obstructive lung disease.  Potentially may consider adding maintenance inhaler if cough and shortness of breath persist.     Allergies  Allergen Reactions   Benadryl [Diphenhydramine Hcl] Other (See Comments)    Makes her skin crawl and she gets hyped up    Omeprazole Rash    Immunization History  Administered Date(s) Administered   PFIZER(Purple Top)SARS-COV-2 Vaccination 06/17/2019, 07/22/2019   Tdap 04/09/2009, 09/26/2016   Zoster Recombinat (Shingrix) 08/27/2019    Past Medical History:  Diagnosis Date   B12 deficiency anemia    BPPV (benign paroxysmal positional vertigo)    Depression    GAD (generalized anxiety disorder)    History of Graves' disease 03/2006   s/p RAI   Hypertension    followed by pcp   Hypothyroidism, postablative 2007   endocrinologist---- dr Kristen [name];   hx grave's s/p RAI 12/ 2007   Insomnia    Migraine    Peripheral neuropathy    Right ovarian cyst     Tobacco History: Social History   Tobacco Use  Smoking Status Never  Smokeless Tobacco  Never   Counseling given: Not Answered   Outpatient Medications Prior to Visit  Medication Sig Dispense Refill   Cyanocobalamin (B-12 COMPLIANCE INJECTION IJ) Inject as directed once a week. Wednesday's     Dermatological Products, Misc. [facility] EX) Apply topically 2 (two) times daily.     fexofenadine (ALLEGRA) 60 MG tablet Take 60 mg by mouth 2 (two) times daily as needed for allergies or rhinitis.     ibuprofen (ADVIL) 800 MG tablet Take 1 tablet (800 mg total) by mouth every 8 (eight) hours as needed. 30 tablet 0   levothyroxine (SYNTHROID) 100 MCG tablet Take 1 tablet (100 mcg total) by mouth daily. 45 tablet 3   metoprolol tartrate (LOPRESSOR) 50 MG tablet Take 50 mg by mouth daily.     oxyCODONE (OXY IR/ROXICODONE) 5 MG immediate release tablet Take 1 tablet (5 mg total) by mouth every 6 (six) hours as needed for severe pain. (Patient not taking: Reported on 07/20/2021) 15 tablet 0   sertraline (ZOLOFT) 50 MG tablet Take 50 mg by mouth daily.     thiamine (VITAMIN B-1) 100 MG tablet Take 100 mg by mouth 2 (two) times daily.     Wheat Dextrin (BENEFIBER) CHEW Chew by mouth daily.     zolpidem (AMBIEN) 10 MG tablet Take 10 mg by mouth at bedtime.     No facility-administered medications prior to visit.      Review of Systems  Review of Systems   Physical Exam  LMP  08/16/2010  Physical Exam   Lab Results:  CBC    Component Value Date/Time   WBC 8.5 06/30/2021 0928   RBC 3.96 06/30/2021 0928   HGB 12.4 06/30/2021 0928   HCT 38.1 06/30/2021 0928   PLT 313 06/30/2021 0928   MCV 96.2 06/30/2021 0928   MCH 31.3 06/30/2021 0928   MCHC 32.5 06/30/2021 0928   RDW 12.8 06/30/2021 0928   LYMPHSABS 1.5 11/24/2020 1358   MONOABS 0.6 11/24/2020 1358   EOSABS 0.0 11/24/2020 1358   BASOSABS 0.1 11/24/2020 1358    BMET    Component Value Date/Time   NA 140 06/30/2021 0928   K 3.7 06/30/2021 0928   CL 112 (H) 06/30/2021 0928   CO2 23 06/30/2021 0928   GLUCOSE 94  06/30/2021 0928   BUN 21 (H) 06/30/2021 0928   CREATININE 0.53 06/30/2021 0928   CALCIUM 8.7 (L) 06/30/2021 0928   GFRNONAA >60 06/30/2021 0928    BNP No results found for: "BNP"  ProBNP No results found for: "PROBNP"  Imaging: No results found.   Assessment & Plan:   No problem-specific Assessment & Plan notes found for this encounter.     Kristen Ehrich, NP 01/01/2022

## 2022-01-02 ENCOUNTER — Ambulatory Visit: Payer: Medicare Other | Admitting: Primary Care

## 2022-01-23 ENCOUNTER — Ambulatory Visit: Payer: Medicare Other | Admitting: Internal Medicine

## 2022-02-01 ENCOUNTER — Institutional Professional Consult (permissible substitution): Payer: Medicare Other | Admitting: Plastic Surgery

## 2022-02-02 ENCOUNTER — Institutional Professional Consult (permissible substitution): Payer: Medicare Other | Admitting: Plastic Surgery

## 2022-03-13 ENCOUNTER — Other Ambulatory Visit: Payer: Self-pay

## 2022-03-13 ENCOUNTER — Emergency Department (HOSPITAL_BASED_OUTPATIENT_CLINIC_OR_DEPARTMENT_OTHER)
Admission: EM | Admit: 2022-03-13 | Discharge: 2022-03-13 | Discharge status: Against Medical Advice | Payer: Medicare Other

## 2022-04-03 ENCOUNTER — Institutional Professional Consult (permissible substitution): Payer: Medicare Other | Admitting: Plastic Surgery

## 2022-04-25 ENCOUNTER — Other Ambulatory Visit: Payer: Self-pay | Admitting: Internal Medicine

## 2022-05-08 ENCOUNTER — Other Ambulatory Visit: Payer: Self-pay | Admitting: Specialist

## 2022-05-08 DIAGNOSIS — G43711 Chronic migraine without aura, intractable, with status migrainosus: Secondary | ICD-10-CM

## 2022-05-18 ENCOUNTER — Ambulatory Visit
Admission: RE | Admit: 2022-05-18 | Discharge: 2022-05-18 | Disposition: A | Discharge status: Home / Self Care | Payer: Medicare Other | Source: Ambulatory Visit | Attending: Specialist | Admitting: Specialist

## 2022-05-18 DIAGNOSIS — G43711 Chronic migraine without aura, intractable, with status migrainosus: Secondary | ICD-10-CM

## 2022-06-22 ENCOUNTER — Encounter: Payer: Self-pay | Admitting: Plastic Surgery

## 2022-06-22 ENCOUNTER — Ambulatory Visit (INDEPENDENT_AMBULATORY_CARE_PROVIDER_SITE_OTHER): Payer: Self-pay | Admitting: Plastic Surgery

## 2022-06-22 VITALS — BP 111/76 | HR 73 | Ht 63.0 in | Wt 148.6 lb

## 2022-06-22 DIAGNOSIS — L905 Scar conditions and fibrosis of skin: Secondary | ICD-10-CM

## 2022-06-22 NOTE — Progress Notes (Signed)
Patient ID: Kristen [name], adult    DOB: 1961/02/13, 62 y.o.   MRN: [id number]   Chief Complaint  Patient presents with   Consult    The patient is a 62 year old female here for evaluation of her nose.  She had a lesion removed from the tip of her nose and now has an indentation.  She would like to see if this can be improved upon so it is not so noticeable.  She also said she thinks her nose has widened.  I expressed to her that I do not think it is really possible that the 2 are related.  She may be looking at her nose more frequently.  She actually has a very nice nose with very nice contour and tip angle.  I would recommend she leave that part of her nose alone.  She has some medical issues as listed below but most significant was B12 deficiency which is now being treated and resolved.    Review of Systems  Constitutional: Negative.   HENT: Negative.    Eyes: Negative.   Respiratory: Negative.    Cardiovascular: Negative.   Gastrointestinal: Negative.   Endocrine: Negative.   Genitourinary: Negative.     Past Medical History:  Diagnosis Date   B12 deficiency anemia    BPPV (benign paroxysmal positional vertigo)    Depression    GAD (generalized anxiety disorder)    History of Graves' disease 03/2006   s/p RAI   Hypertension    followed by pcp   Hypothyroidism, postablative 2007   endocrinologist---- dr Cruzita Lederer;   hx grave's s/p RAI 12/ 2007   Insomnia    Migraine    Peripheral neuropathy    Right ovarian cyst     Past Surgical History:  Procedure Laterality Date   COLONOSCOPY WITH PROPOFOL  04/2021   LAPAROSCOPIC BILATERAL SALPINGO OOPHERECTOMY Bilateral 07/03/2021   Procedure: LAPAROSCOPIC BILATERAL SALPINGO OOPHORECTOMY; PELVIC WASHINGS;  Surgeon: Armandina Stammer, DO;  Location: [place];  Service: Gynecology;  Laterality: Bilateral;   SHOULDER ARTHROSCOPY WITH SUBACROMIAL DECOMPRESSION Right 04/11/2020   Procedure: SHOULDER ARTHROSCOPY WITH  SUBACROMIAL DECOMPRESSION;  Surgeon: Tania Ade, MD;  Location: [place];  Service: Orthopedics;  Laterality: Right;      Current Outpatient Medications:    Cyanocobalamin (B-12 COMPLIANCE INJECTION IJ), Inject as directed once a week. Wednesday's, Disp: , Rfl:    Dermatological Products, Misc. [facility] EX), Apply topically 2 (two) times daily., Disp: , Rfl:    fexofenadine (ALLEGRA) 60 MG tablet, Take 60 mg by mouth 2 (two) times daily as needed for allergies or rhinitis., Disp: , Rfl:    ibuprofen (ADVIL) 800 MG tablet, Take 1 tablet (800 mg total) by mouth every 8 (eight) hours as needed., Disp: 30 tablet, Rfl: 0   levothyroxine (SYNTHROID) 100 MCG tablet, TAKE 1 TABLET BY MOUTH EVERY DAY, Disp: 90 tablet, Rfl: 1   metoprolol tartrate (LOPRESSOR) 50 MG tablet, Take 50 mg by mouth daily., Disp: , Rfl:    oxyCODONE (OXY IR/ROXICODONE) 5 MG immediate release tablet, Take 1 tablet (5 mg total) by mouth every 6 (six) hours as needed for severe pain., Disp: 15 tablet, Rfl: 0   sertraline (ZOLOFT) 50 MG tablet, Take 50 mg by mouth daily., Disp: , Rfl:    thiamine (VITAMIN B-1) 100 MG tablet, Take 100 mg by mouth 2 (two) times daily., Disp: , Rfl:    Wheat Dextrin (BENEFIBER) CHEW, Chew by mouth daily., Disp: ,  Rfl:    zolpidem (AMBIEN) 10 MG tablet, Take 10 mg by mouth at bedtime., Disp: , Rfl:    Objective:   Vitals:   06/22/22 1001  BP: 111/76  Pulse: 73  SpO2: 97%    Physical Exam Vitals and nursing note reviewed.  Constitutional:      Appearance: Normal appearance.  HENT:     Head: Normocephalic.     Nose:   Cardiovascular:     Rate and Rhythm: Normal rate.     Pulses: Normal pulses.  Pulmonary:     Effort: Pulmonary effort is normal.  Skin:    General: Skin is warm.     Capillary Refill: Capillary refill takes less than 2 seconds.     Coloration: Skin is not jaundiced.     Findings: Lesion present. No bruising.  Neurological:     Mental Status:  She is alert and oriented to person, place, and time.  Psychiatric:        Mood and Affect: Mood normal.        Behavior: Behavior normal.        Thought Content: Thought content normal.     Assessment & Plan:  Scar of nose  We discussed different options for minimizing the area of her nose.  We can do OTR/L which is like a micro dermabrasion this may help minimize the way that the divot looks.  If that does not work then she will likely need reexcision.  She would like to add the laser to URL to her whole face and then we will increase the depth on the tip of her nose.  Pictures were obtained of the patient and placed in the chart with the patient's or guardian's permission.   Wallace, DO

## 2022-08-10 ENCOUNTER — Other Ambulatory Visit: Payer: Medicare Other | Admitting: Plastic Surgery

## 2022-09-26 ENCOUNTER — Encounter: Payer: Self-pay | Admitting: Specialist

## 2022-10-12 IMAGING — DX DG KNEE AP/LAT W/ SUNRISE*R*
3 series · 3 of 3 positions shown · non-contrast
Comparison: None.

CLINICAL DATA: Atraumatic bilateral knee pain.

EXAM:
RIGHT KNEE 3 VIEWS

[knee ap]
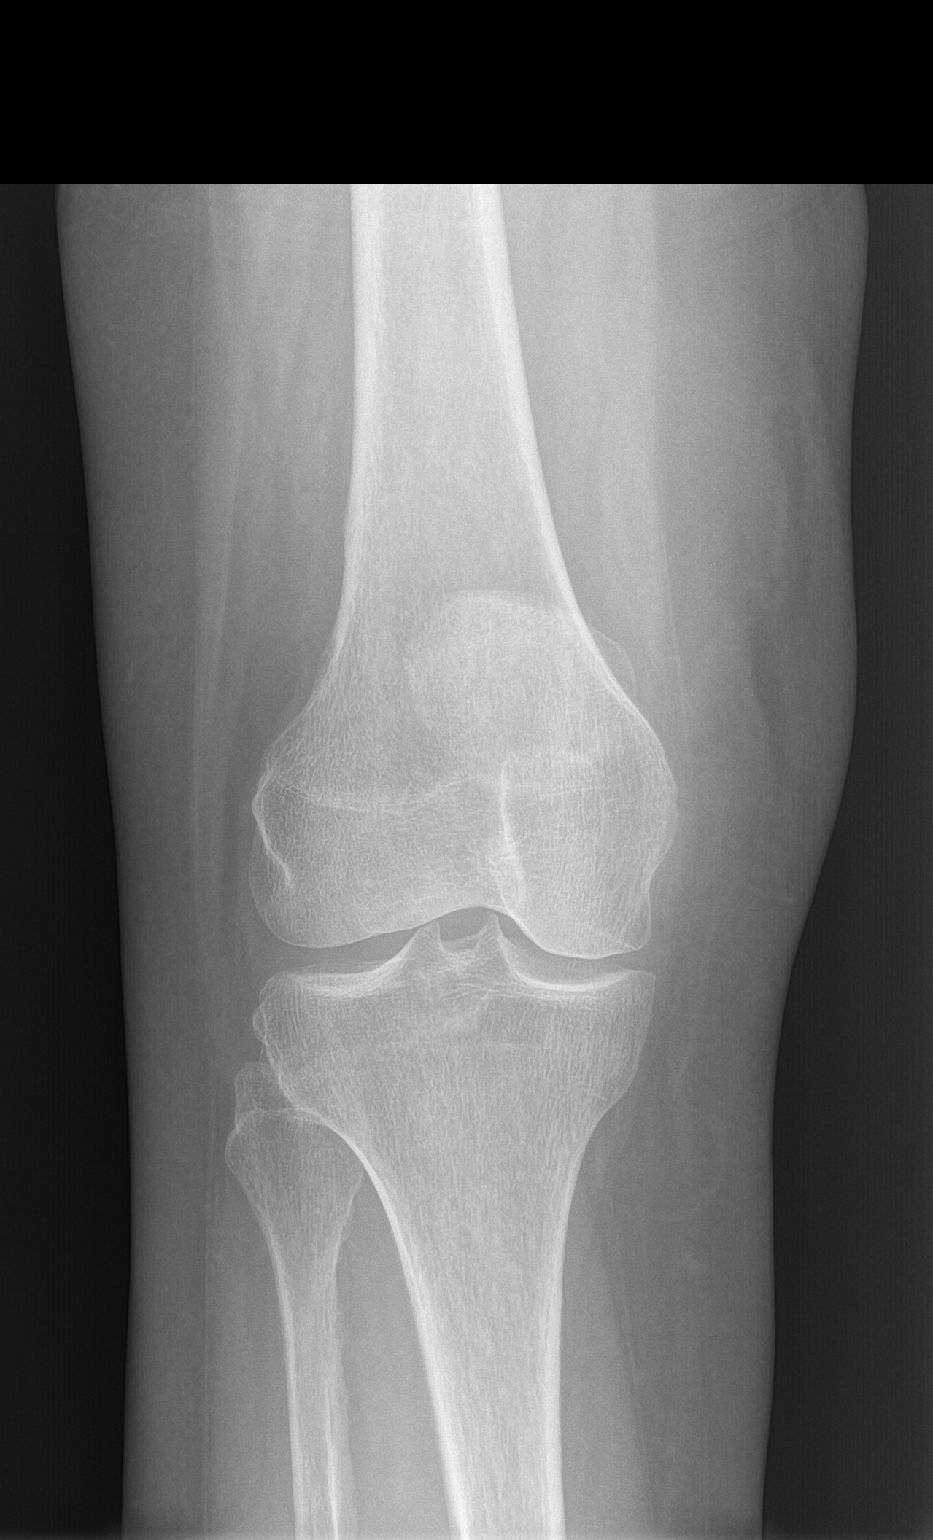

[knee lat]
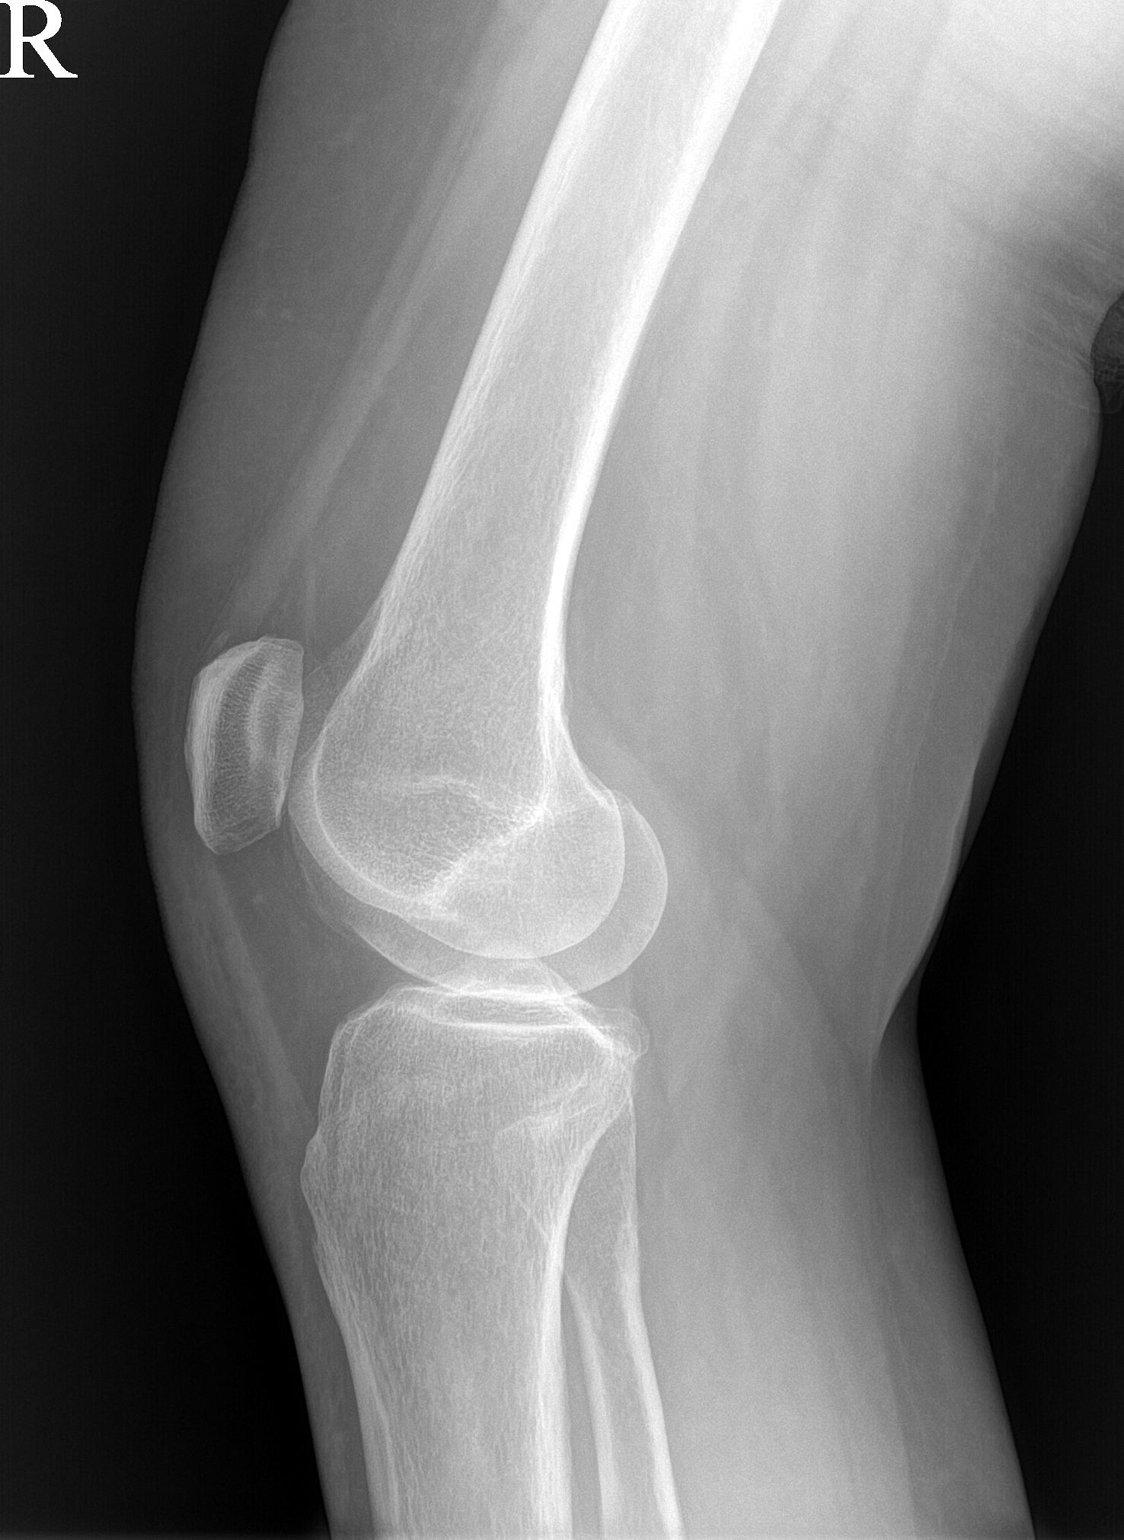

[patella]
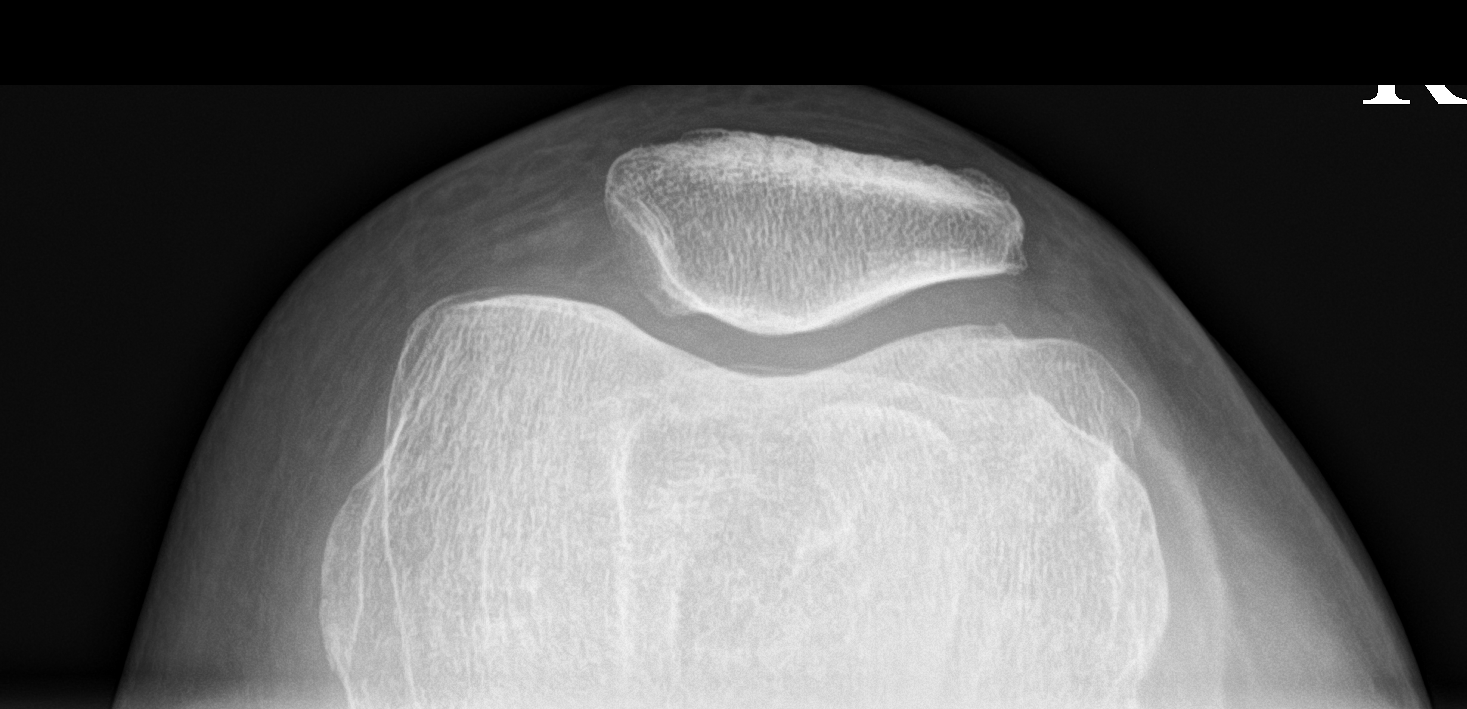

[3 of 3 positions shown; findings below may reference images not displayed]

FINDINGS: No evidence of fracture, dislocation, or joint effusion. No evidence
of arthropathy or other focal bone abnormality. Soft tissues are
unremarkable.
IMPRESSION: Negative.

## 2022-10-12 IMAGING — DX DG KNEE AP/LAT W/ SUNRISE*L*
3 series · 3 of 3 positions shown · non-contrast
Comparison: None.

CLINICAL DATA: Atraumatic bilateral knee pain.

EXAM:
LEFT KNEE 3 VIEWS

[knee ap]
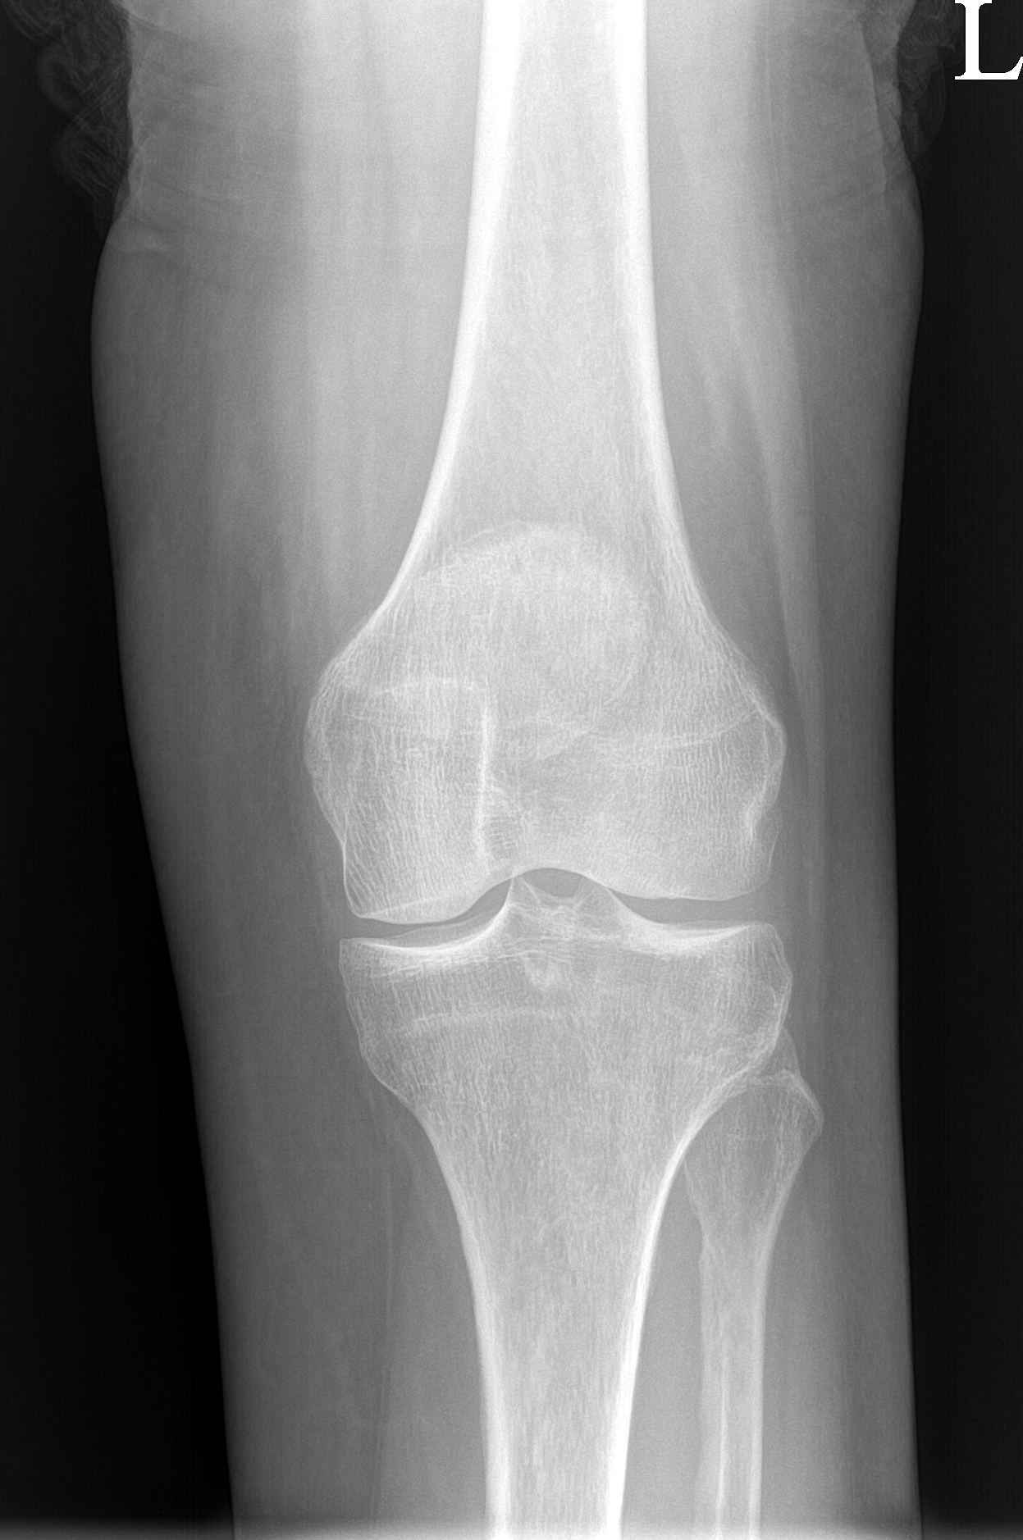

[knee lat]
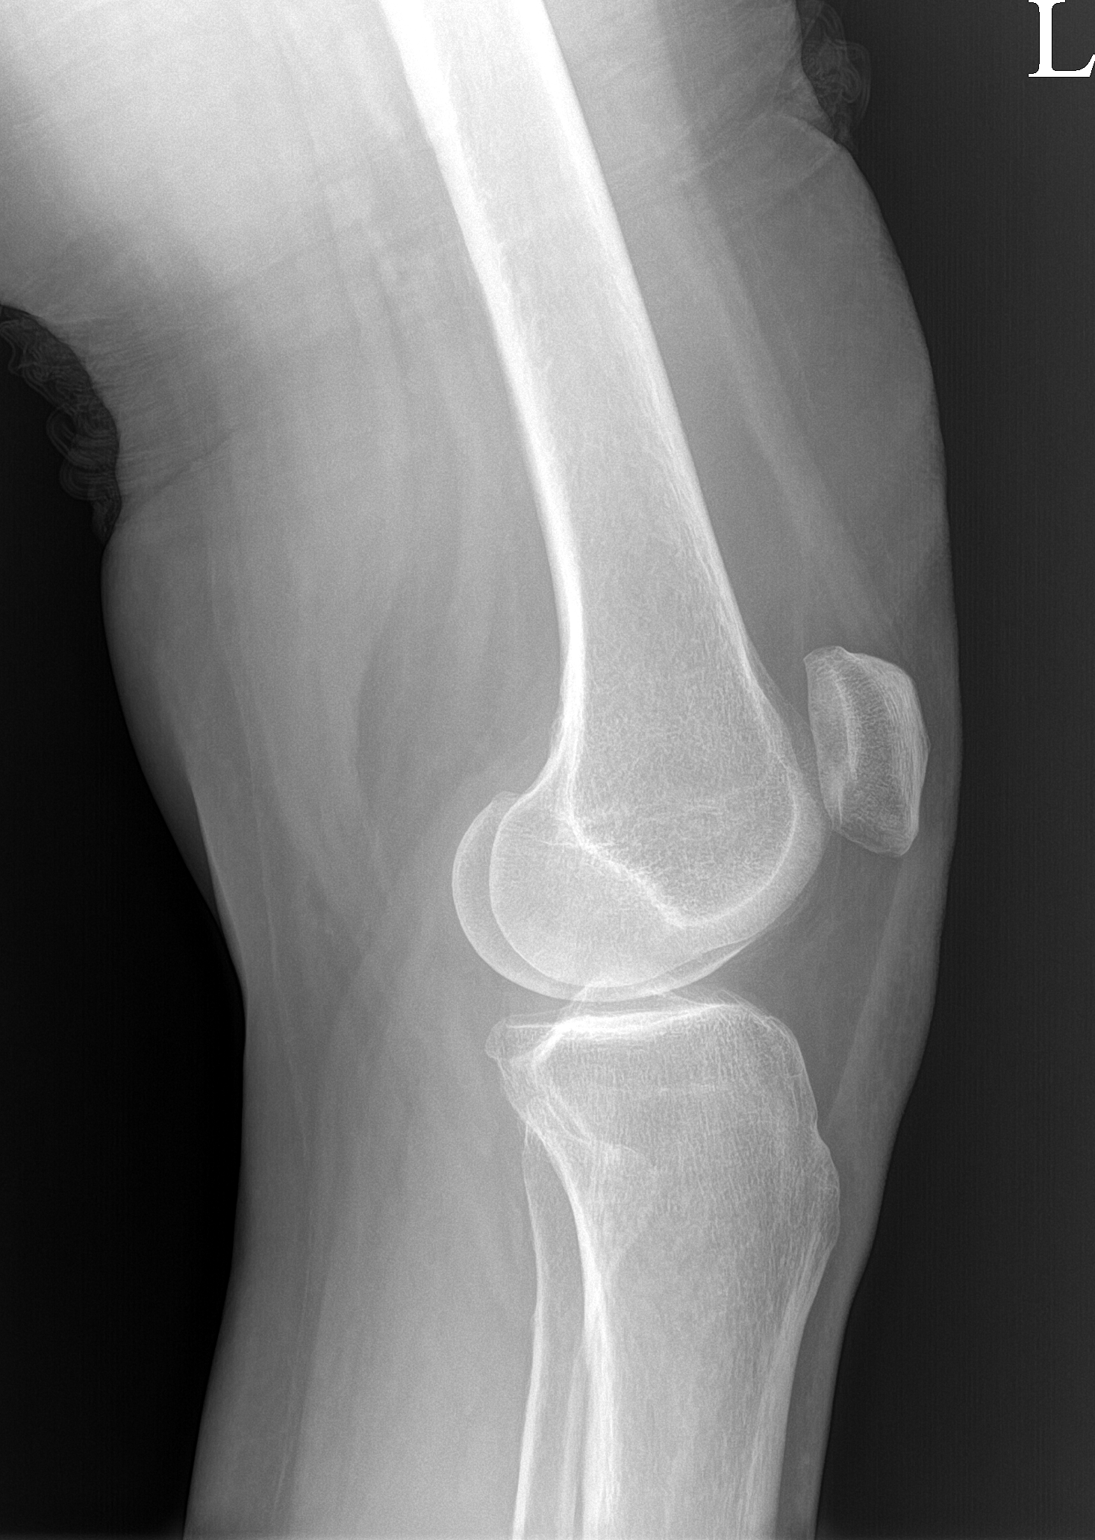

[patella]
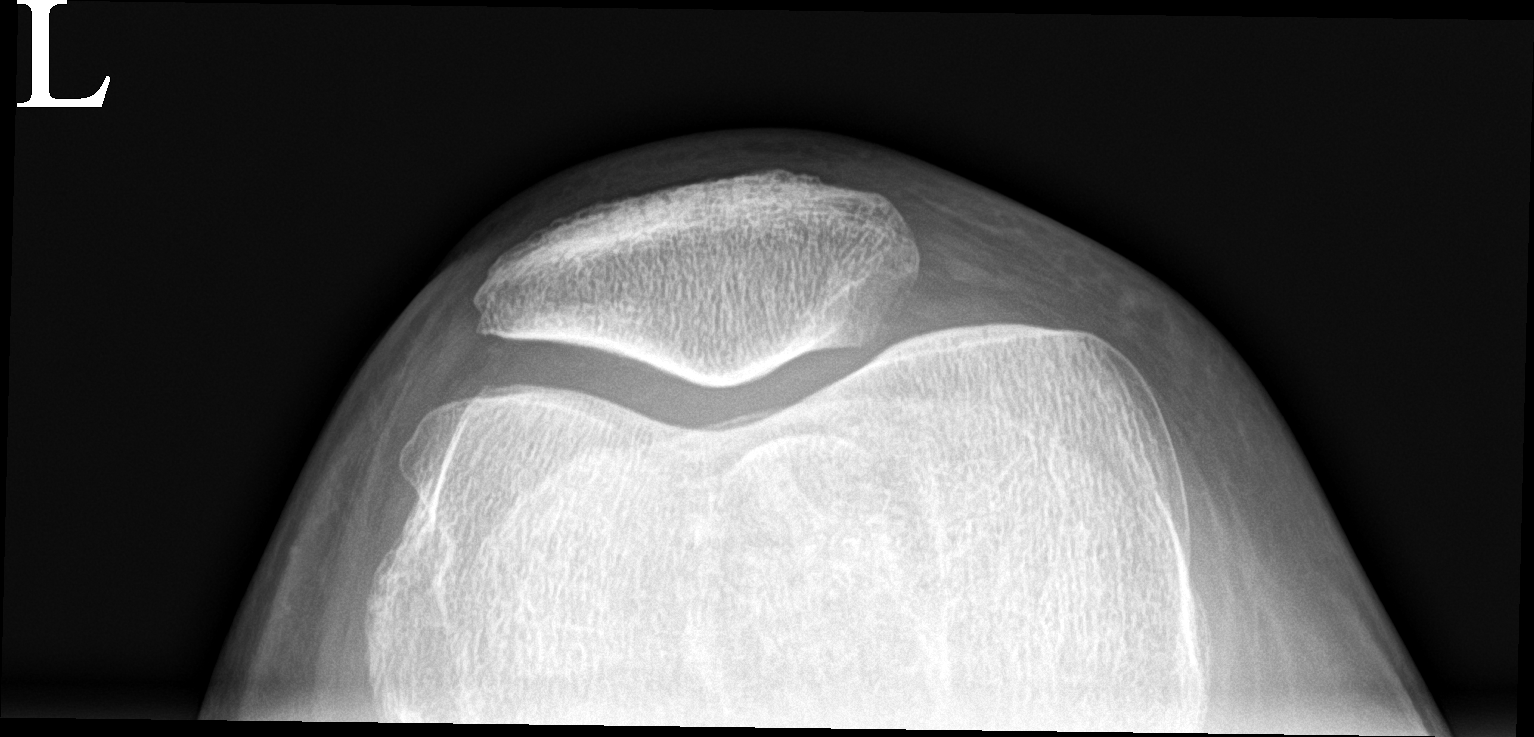

[3 of 3 positions shown; findings below may reference images not displayed]

FINDINGS: No evidence of fracture, dislocation, or joint effusion. No evidence
of arthropathy or other focal bone abnormality. Soft tissues are
unremarkable.
IMPRESSION: Negative.

## 2022-10-23 ENCOUNTER — Other Ambulatory Visit: Payer: Self-pay | Admitting: Internal Medicine

## 2022-11-08 ENCOUNTER — Encounter: Payer: Self-pay | Admitting: Specialist

## 2022-11-16 ENCOUNTER — Other Ambulatory Visit: Payer: Self-pay | Admitting: Internal Medicine

## 2022-11-23 ENCOUNTER — Other Ambulatory Visit: Payer: Medicare Other | Admitting: Plastic Surgery

## 2022-11-30 ENCOUNTER — Encounter: Payer: Self-pay | Admitting: Plastic Surgery

## 2022-11-30 ENCOUNTER — Ambulatory Visit (INDEPENDENT_AMBULATORY_CARE_PROVIDER_SITE_OTHER): Payer: Self-pay | Admitting: Plastic Surgery

## 2022-11-30 DIAGNOSIS — Z719 Counseling, unspecified: Secondary | ICD-10-CM

## 2022-11-30 NOTE — Progress Notes (Signed)
Preoperative Dx: hyperpigmentation of face  Postoperative Dx:  same  Procedure: laser to face   Anesthesia: none  Description of Procedure:  Risks and complications were explained to the patient. Consent was confirmed and signed. Eye protection was placed. Time out was called and all information was confirmed to be correct. The area  area was prepped with alcohol and wiped dry. The BBL laser was set at [facility] for face with [facility]. The face and nose were lasered. The patient tolerated the procedure well and there were no complications. The patient is to follow up in 4 weeks.

## 2022-12-24 ENCOUNTER — Ambulatory Visit: Payer: Medicare Other | Admitting: Internal Medicine

## 2022-12-24 NOTE — Progress Notes (Deleted)
Patient ID: Libbey Sticht, adult   DOB: [birth year], 62 y.o.   MRN: [id number]  HPI  Gwenneth Jarnagin is a 62 y.o.-year-old adult, returning for f/u for postablative hypothyroidism, dx'ed in 03/2006 after RAI ablation for Graves ds. Last visit 1 year and 5 months ago. PCP: [facility]  ObGyn: Dr Seymour Bars.   Interim history:  Pt is on LT4 112 mcg daily (increased 02/2021), taken: - in am (5:30 AM) - fasting - at least 30 min from b'fast - no calcium - no iron - stopped multivitamins at lunchtime - no PPIs - + Biotin-in Hair skin and nails vitamins (stopped some time ago). However, she takes a B complex with apple cider vinegar, which contains 500 mcg biotin. Also, on thiamine 2x a day, B12 im once a week.  Reviewed her TFTs: Lab Results  Component Value Date   TSH 1.49 10/24/2021   TSH 0.15 (L) 08/04/2021   TSH 0.67 04/11/2021   TSH 6.77 (H) 03/07/2021   TSH 3.52 02/23/2021   TSH 0.40 08/11/2020   FREET4 1.11 10/24/2021   FREET4 0.92 08/04/2021   FREET4 1.07 04/11/2021   FREET4 0.75 03/07/2021   FREET4 2.56 (H) 02/23/2021   FREET4 1.02 08/11/2020  09/20/2020 (OB/GYN): TSH 1.39 10/21/2014: TSH 8.210 09/01/2014: TSH 1.590 07/26/2014: TSH 0.440 06/28/2014: TSH 0.110 06/09/2014: TSH 0.150 06/09/2014: TSH 0.300 (0.34-4.8) 04/26/2014: TSH 1.490 12/11/2013: TSH 0.290 (0.34-4.8)  Pt denies: - feeling nodules in neck - hoarseness - choking - SOB with lying down - dysphagia (this is chronic-in the past it resolved after gargling with salt water)  No FH of thyroid disease or cancer. No h/o radiation tx to head or neck other than RAI treatment for Graves' disease.  She continues to have anxiety/PTSD.  In 2021 she had steroid injections in her shoulder for rotator cuff.   She has a history of neuropathy >> leg weakness >> falls >> saw Dr. Terrilee Files >> had PT.  She has h/o low B vitamins >> on replacement. She has h/o repeated UTIs and yeast infections >> sees urology. She has  a h/o thrush and rash on abdomen, forearms and legs >> improved. Also has h/o BPPV. She had a BSO in 2023. Since the surgery, she had more hair loss.   ROS: + See HPI  I reviewed pt's medications, allergies, PMH, social hx, family hx, and changes were documented in the history of present illness. Otherwise, unchanged from my initial visit note.  PMH: - h/o Graves' disease - Post ablative hypothyroidism - Anxiety /depression - Postmenopausal status   PMH: -No surgical history  Social History   Social History   Marital Status: Married    Spouse Name: N/A   Number of Children: 1   Occupational History   N/a     Smoking status: Never Smoker    Smokeless tobacco: No   Alcohol Use: No   Drug Use: No   Current Outpatient Medications  Medication Sig Dispense Refill   Cyanocobalamin (B-12 COMPLIANCE INJECTION IJ) Inject as directed once a week. Wednesday's     Dermatological Products, Misc. [facility] EX) Apply topically 2 (two) times daily.     fexofenadine (ALLEGRA) 60 MG tablet Take 60 mg by mouth 2 (two) times daily as needed for allergies or rhinitis.     ibuprofen (ADVIL) 800 MG tablet Take 1 tablet (800 mg total) by mouth every 8 (eight) hours as needed. 30 tablet 0   levothyroxine (SYNTHROID) 100 MCG tablet TAKE 1 TABLET BY MOUTH EVERY  DAY 90 tablet 0   metoprolol tartrate (LOPRESSOR) 50 MG tablet Take 50 mg by mouth daily.     oxyCODONE (OXY IR/ROXICODONE) 5 MG immediate release tablet Take 1 tablet (5 mg total) by mouth every 6 (six) hours as needed for severe pain. 15 tablet 0   sertraline (ZOLOFT) 50 MG tablet Take 50 mg by mouth daily.     thiamine (VITAMIN B-1) 100 MG tablet Take 100 mg by mouth 2 (two) times daily.     Wheat Dextrin (BENEFIBER) CHEW Chew by mouth daily.     zolpidem (AMBIEN) 10 MG tablet Take 10 mg by mouth at bedtime.     No current facility-administered medications for this visit.   Allergies  Allergen Reactions   Benadryl [Diphenhydramine  Hcl] Other (See Comments)    Makes her skin crawl and she gets hyped up    Duloxetine Hcl Other (See Comments)   Omeprazole Rash   Family history: - No thyroid family history  - No diabetes, hypertension, hyperlipidemia, cancer in family members   PE: LMP 08/16/2010   Wt Readings from Last 3 Encounters:  06/22/22 148 lb 9.6 oz (67.4 kg)  09/22/21 140 lb 12.8 oz (63.9 kg)  07/31/21 139 lb 12.8 oz (63.4 kg)   Constitutional: normal weight, in NAD Eyes:  EOMI, no exophthalmos ENT: no neck masses, no cervical lymphadenopathy Cardiovascular: RRR, No MRG Respiratory: CTA B Musculoskeletal: no deformities Skin:no rashes Neurological: no tremor with outstretched hands  ASSESSMENT: 1.  Post ablative Hypothyroidism  2.  Neck swelling  PLAN:  1. Patient with longstanding hypothyroidism, developed after RAI treatment for Graves' disease, with previously uncontrolled TFTs over the years.  Her TFTs improved after switching to Synthroid d.a.w., but afterwards we had to switch back to generic levothyroxine due to price.  She now returns after 1 year and 5 months from the previous visit. - latest thyroid labs reviewed with pt. >> normal: Lab Results  Component Value Date   TSH 1.49 10/24/2021  - she continues on LT4 112 mcg daily - pt feels good on this dose, but recently feels that the thyroid may be off.  At last visit, she mentioned increased hair loss after BSO surgery. - we discussed about taking the thyroid hormone every day, with water, >30 minutes before breakfast, separated by >4 hours from acid reflux medications, calcium, iron, multivitamins. Pt. is taking it correctly. - will check thyroid tests today: TSH and fT4 - If labs are abnormal, she will need to return for repeat TFTs in 1.5 months - OTW, RTC in 1 year  2.  Neck swelling -She previously was found to have neck swelling without neck compression symptoms except for mild dysphagia, which is chronic (possibly associated  with dry mouth).  I did not feel thyromegaly or thyroid nodules on physical exam.  She did have mild lymphadenopathy bilaterally on the lateral cervical regions. -the previous thyroid uptake was homogeneous. -At today's visit, she does not have any neck compression symptoms or masses felt on palpation of her neck -No further investigation is needed for this.  Carlus Pavlov, MD PhD [facility] Endocrinology

## 2022-12-28 ENCOUNTER — Ambulatory Visit (INDEPENDENT_AMBULATORY_CARE_PROVIDER_SITE_OTHER): Payer: Medicare Other | Admitting: Internal Medicine

## 2022-12-28 ENCOUNTER — Telehealth: Payer: Self-pay

## 2022-12-28 ENCOUNTER — Encounter: Payer: Self-pay | Admitting: Internal Medicine

## 2022-12-28 VITALS — BP 110/60 | HR 71 | Ht 63.0 in | Wt 150.4 lb

## 2022-12-28 DIAGNOSIS — E890 Postprocedural hypothyroidism: Secondary | ICD-10-CM | POA: Diagnosis not present

## 2022-12-28 DIAGNOSIS — R221 Localized swelling, mass and lump, neck: Secondary | ICD-10-CM

## 2022-12-28 LAB — T4, FREE: Free T4: 0.86 ng/dL (ref 0.60–1.60)

## 2022-12-28 LAB — TSH: TSH: 12.7 u[IU]/mL — ABNORMAL HIGH (ref 0.35–5.50)

## 2022-12-28 MED ORDER — LEVOTHYROXINE SODIUM 112 MCG PO TABS: 112.0000 ug | ORAL_TABLET | Freq: Every day | ORAL | 2 refills | Status: DC

## 2022-12-28 NOTE — Progress Notes (Addendum)
Patient ID: Kristen [name], adult   DOB: Feb 05, 1961, 62 y.o.   MRN: [id number]  HPI  Kristen [name] is a 62 y.o.-year-old adult, returning for f/u for postablative hypothyroidism, dx'ed in 03/2006 after RAI ablation for Graves ds. Last visit 1 year and 5 months ago. PCP: [facility]  ObGyn: Dr Seymour Bars.   Interim history: Her hair loss and neuropathy improved lately.  She had several falls in the last year due to neuropathy, latest 2 months ago. She feels a little jittery, has some insomnia. She has occasional palpitations - not changed, usually when lying down. No heat intolerance.  Pt is on LT4 112 mcg daily (increased 02/2021), taken: - in am (5:30 AM) - fasting - at least 30 min from b'fast - no calcium - no iron - stopped multivitamins at lunchtime - no PPIs - off Biotin-prev. Hair skin and nails vitamins (stopped some time ago). Off B complex with apple cider vinegar. Drinks a B-vitamin supplemented drink 2x a week - not in the last 2 days   Reviewed her TFTs: Lab Results  Component Value Date   TSH 1.49 10/24/2021   TSH 0.15 (L) 08/04/2021   TSH 0.67 04/11/2021   TSH 6.77 (H) 03/07/2021   TSH 3.52 02/23/2021   TSH 0.40 08/11/2020   FREET4 1.11 10/24/2021   FREET4 0.92 08/04/2021   FREET4 1.07 04/11/2021   FREET4 0.75 03/07/2021   FREET4 2.56 (H) 02/23/2021   FREET4 1.02 08/11/2020  09/20/2020 (OB/GYN): TSH 1.39 10/21/2014: TSH 8.210 09/01/2014: TSH 1.590 07/26/2014: TSH 0.440 06/28/2014: TSH 0.110 06/09/2014: TSH 0.150 06/09/2014: TSH 0.300 (0.34-4.8) 04/26/2014: TSH 1.490 12/11/2013: TSH 0.290 (0.34-4.8)  Pt denies: - feeling nodules in neck - hoarseness - choking - SOB with lying down - dysphagia (this is chronic-in the past it resolved after gargling with salt water) - not currently  No FH of thyroid disease or cancer. No h/o radiation tx to head or neck other than RAI treatment for Graves' disease.  She continues to have anxiety/PTSD.  In 2021 she had  steroid injections in her shoulder for rotator cuff.   She has a history of neuropathy >> leg weakness >> falls >> saw Dr. Terrilee Files >> had PT.  She has h/o low B vitamins >> on replacement. She has h/o repeated UTIs and yeast infections >> sees urology. She has a h/o thrush and rash on abdomen, forearms and legs >> improved. Also has h/o BPPV. She had a BSO in 2023. Since the surgery, she had more hair loss.   ROS: + See HPI  I reviewed pt's medications, allergies, PMH, social hx, family hx, and changes were documented in the history of present illness. Otherwise, unchanged from my initial visit note.  PMH: - h/o Graves' disease - Post ablative hypothyroidism - Anxiety /depression - Postmenopausal status   PMH: -No surgical history  Social History   Social History   Marital Status: Married    Spouse Name: N/A   Number of Children: 1   Occupational History   N/a     Smoking status: Never Smoker    Smokeless tobacco: No   Alcohol Use: No   Drug Use: No   Current Outpatient Medications  Medication Sig Dispense Refill   Cyanocobalamin (B-12 COMPLIANCE INJECTION IJ) Inject as directed once a week. Wednesday's     Dermatological Products, Misc. [facility] EX) Apply topically 2 (two) times daily.     fexofenadine (ALLEGRA) 60 MG tablet Take 60 mg by mouth 2 (two) times daily  as needed for allergies or rhinitis.     ibuprofen (ADVIL) 800 MG tablet Take 1 tablet (800 mg total) by mouth every 8 (eight) hours as needed. 30 tablet 0   levothyroxine (SYNTHROID) 100 MCG tablet TAKE 1 TABLET BY MOUTH EVERY DAY 90 tablet 0   metoprolol tartrate (LOPRESSOR) 50 MG tablet Take 50 mg by mouth daily.     oxyCODONE (OXY IR/ROXICODONE) 5 MG immediate release tablet Take 1 tablet (5 mg total) by mouth every 6 (six) hours as needed for severe pain. 15 tablet 0   sertraline (ZOLOFT) 50 MG tablet Take 50 mg by mouth daily.     thiamine (VITAMIN B-1) 100 MG tablet Take 100 mg by mouth 2 (two)  times daily.     Wheat Dextrin (BENEFIBER) CHEW Chew by mouth daily.     zolpidem (AMBIEN) 10 MG tablet Take 10 mg by mouth at bedtime.     No current facility-administered medications for this visit.   Allergies  Allergen Reactions   Benadryl [Diphenhydramine Hcl] Other (See Comments)    Makes her skin crawl and she gets hyped up    Duloxetine Hcl Other (See Comments)   Omeprazole Rash   Family history: - No thyroid family history  - No diabetes, hypertension, hyperlipidemia, cancer in family members   PE: BP 110/60   Pulse 71   Ht 5\' 3"  (1.6 m)   Wt 150 lb 6.4 oz (68.2 kg)   LMP 08/16/2010   SpO2 95%   BMI 26.64 kg/m   Wt Readings from Last 3 Encounters:  12/28/22 150 lb 6.4 oz (68.2 kg)  06/22/22 148 lb 9.6 oz (67.4 kg)  09/22/21 140 lb 12.8 oz (63.9 kg)   Constitutional: normal weight, in NAD Eyes:  EOMI, no exophthalmos ENT: no neck masses, no cervical lymphadenopathy Cardiovascular: RRR, No MRG Respiratory: CTA B Musculoskeletal: no deformities Skin:no rashes Neurological: no tremor with outstretched hands  ASSESSMENT: 1.  Post ablative Hypothyroidism  2.  Neck swelling  PLAN:  1. Patient with longstanding hypothyroidism, developed after RAI treatment for Graves' disease, with previously uncontrolled TFTs over the years.  Her TFTs improved after switching to Synthroid d.a.w., but afterwards we had to switch back to generic levothyroxine due to price.  She now returns after 1 year and 5 months from the previous visit. - latest thyroid labs reviewed with pt. >> normal: Lab Results  Component Value Date   TSH 1.49 10/24/2021  - she is now on LT4 100 mcg daily, but the latest dose change was actually to 112 mcg daily.  I am not sure why she is on the lower dose... - pt feels good on this dose, but did feel recently that the thyroid may be off. She gained weight (11 lbs, also feels jittery, has insomnia) - we discussed about taking the thyroid hormone every day,  with water, >30 minutes before breakfast, separated by >4 hours from acid reflux medications, calcium, iron, multivitamins. Pt. is taking it correctly. - will check thyroid tests today: TSH and fT4 - If labs are abnormal, she will need to return for repeat TFTs in 1.5 months - OTW, RTC in 1 year  2.  Neck swelling -She previously was found to have neck swelling without neck compression symptoms except for mild dysphagia, which is chronic (possibly associated with dry mouth).  I did not feel thyromegaly or thyroid nodules on physical exam.  She had mild lymphadenopathy bilaterally on the lateral cervical regions -Previous thyroid uptake  was homogeneous -At today's visit, she does not have any neck compression symptoms or masses felt on palpation of her neck today -no further imaging investigation is needed for this; will continue to follow her clinically  Needs refills.   Component     Latest Ref Rng 12/28/2022  TSH     0.35 - 5.50 uIU/mL 12.70 (H)   T4,Free(Direct)     0.60 - 1.60 ng/dL 9.14   TSH is now high.  Since she did not miss any doses, we will need to increase her levothyroxine dose to 112 mcg daily.  Plan to repeat her TFTs in 1.5 months.  Carlus Pavlov, MD PhD [facility] Endocrinology

## 2022-12-28 NOTE — Telephone Encounter (Signed)
I called and spoke with the patient and she states that she was taking 100 mcg so I have sent in 112 mcg per the message from dr. Elvera Lennox

## 2022-12-28 NOTE — Telephone Encounter (Signed)
Patient called to advise she saw her lab results and she has not missed any doses of medication.   Kristen [name]

## 2022-12-28 NOTE — Telephone Encounter (Signed)
Jasmine, I just noticed that she was actually supposed to be on 112 mcg daily but on her medication list is 100 mcg daily...  If this is correct and the lower dose was sent to her pharmacy in 11/2022, lets go ahead and send the 112 mcg dose and have her back for labs in 1.5 months (labs are in).  If this is not correct and she is indeed on 112 mcg daily, then please send a prescription for 45 tabs of 125 mcg daily with 6 refills.

## 2022-12-28 NOTE — Patient Instructions (Signed)
Please stop at the lab.  Continue levothyroxine 112 mcg daily.  Take the thyroid hormone every day, with water, at least 30 minutes before breakfast, separated by at least 4 hours from: - acid reflux medications - calcium - iron - multivitamins  Please return in 1 year.

## 2023-01-04 ENCOUNTER — Other Ambulatory Visit: Payer: Self-pay | Admitting: Plastic Surgery

## 2023-01-08 ENCOUNTER — Other Ambulatory Visit (HOSPITAL_COMMUNITY): Payer: Self-pay

## 2023-01-08 ENCOUNTER — Encounter: Payer: Self-pay | Admitting: Plastic Surgery

## 2023-01-08 ENCOUNTER — Ambulatory Visit (INDEPENDENT_AMBULATORY_CARE_PROVIDER_SITE_OTHER): Payer: Self-pay | Admitting: Plastic Surgery

## 2023-01-08 VITALS — BP 120/80 | HR 60 | Ht 63.0 in

## 2023-01-08 DIAGNOSIS — Z719 Counseling, unspecified: Secondary | ICD-10-CM

## 2023-01-08 MED ORDER — LIDOCAINE 23% - TETRACAINE 7% TOPICAL OINTMENT (PLASTICIZED)
1.0000 | TOPICAL_OINTMENT | Freq: Once | CUTANEOUS | 0 refills | Status: AC
  Filled 2023-01-08: qty 60, 1d supply, fill #0
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
## 2023-01-09 ENCOUNTER — Other Ambulatory Visit (HOSPITAL_COMMUNITY): Payer: Self-pay

## 2023-01-21 ENCOUNTER — Other Ambulatory Visit (HOSPITAL_COMMUNITY): Payer: Self-pay

## 2023-02-08 ENCOUNTER — Other Ambulatory Visit (INDEPENDENT_AMBULATORY_CARE_PROVIDER_SITE_OTHER): Payer: Medicare Other

## 2023-02-08 DIAGNOSIS — E890 Postprocedural hypothyroidism: Secondary | ICD-10-CM

## 2023-02-08 LAB — T4, FREE: Free T4: 0.78 ng/dL (ref 0.60–1.60)

## 2023-02-08 LAB — TSH: TSH: 4.54 u[IU]/mL (ref 0.35–5.50)

## 2023-02-09 ENCOUNTER — Other Ambulatory Visit: Payer: Self-pay | Admitting: Internal Medicine

## 2023-02-11 ENCOUNTER — Telehealth: Payer: Self-pay | Admitting: Plastic Surgery

## 2023-02-11 NOTE — Telephone Encounter (Signed)
Patient states that she still has 2 open sores on her face and would like to know if she should still have her TRL laser done tomorrow?

## 2023-02-12 ENCOUNTER — Other Ambulatory Visit: Payer: Medicare Other | Admitting: Plastic Surgery

## 2023-03-23 ENCOUNTER — Other Ambulatory Visit: Payer: Self-pay | Admitting: Internal Medicine

## 2023-04-23 ENCOUNTER — Encounter: Payer: Self-pay | Admitting: Plastic Surgery

## 2023-04-23 ENCOUNTER — Telehealth: Payer: Self-pay

## 2023-04-23 NOTE — Telephone Encounter (Signed)
 Patient called and verbalized she is needing the cream for her face for the appointment scheduled 04/26/2023.  Patient verbalized the cream was going to be sent to [facility] instead of [facility] due to cost.  Please f/u with patient
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
## 2023-04-24 ENCOUNTER — Encounter: Payer: Self-pay | Admitting: Plastic Surgery

## 2023-04-25 ENCOUNTER — Other Ambulatory Visit: Payer: Self-pay | Admitting: Plastic Surgery

## 2023-04-25 ENCOUNTER — Other Ambulatory Visit (HOSPITAL_COMMUNITY): Payer: Self-pay

## 2023-04-25 MED ORDER — LIDOCAINE 23% - TETRACAINE 7% TOPICAL OINTMENT (PLASTICIZED)
1.0000 | TOPICAL_OINTMENT | Freq: Once | CUTANEOUS | 0 refills | Status: AC
  Filled 2023-04-25: qty 60, 1d supply, fill #0
  Filled 2023-05-31: qty 60, 30d supply, fill #0

## 2023-04-26 ENCOUNTER — Telehealth: Payer: Self-pay | Admitting: Plastic Surgery

## 2023-04-26 ENCOUNTER — Other Ambulatory Visit: Payer: Medicare Other | Admitting: Plastic Surgery

## 2023-04-26 ENCOUNTER — Other Ambulatory Visit (HOSPITAL_COMMUNITY): Payer: Self-pay

## 2023-04-26 NOTE — Telephone Encounter (Signed)
CALLED LVMAIL FOR PT TO CALL OFFICE IF SHE WANTS TO BE SEE SOONER SHE CAN BE SEEN BY MATT PER CLINICAL STAFF, Per Dr D pt can not have Laser today, at min the laser can be done in two week, pt must be healed

## 2023-05-08 ENCOUNTER — Other Ambulatory Visit (HOSPITAL_COMMUNITY): Payer: Self-pay

## 2023-05-31 ENCOUNTER — Other Ambulatory Visit (HOSPITAL_COMMUNITY): Payer: Self-pay

## 2023-07-05 ENCOUNTER — Ambulatory Visit: Payer: Self-pay | Admitting: Plastic Surgery

## 2023-07-05 ENCOUNTER — Encounter: Payer: Self-pay | Admitting: Plastic Surgery

## 2023-07-05 DIAGNOSIS — Z719 Counseling, unspecified: Secondary | ICD-10-CM

## 2023-07-05 NOTE — Progress Notes (Signed)
 Preoperative Dx: Hyperpigmentation of face and neck  Postoperative Dx:  same  Procedure: laser to face and neck  Anesthesia: none  Description of Procedure:  Risks and complications were explained to the patient. Consent was confirmed and signed. Eye protection was placed. Time out was called and all information was confirmed to be correct. The area  area was prepped with alcohol and wiped dry. The heroic laser was set at 532 nm at 2.5 J/cm2. The face and neck were lasered. The patient tolerated the procedure well and there were no complications. The patient is to follow up in 4 weeks.

## 2023-08-06 ENCOUNTER — Telehealth: Payer: Self-pay | Admitting: Plastic Surgery

## 2023-08-06 ENCOUNTER — Ambulatory Visit (INDEPENDENT_AMBULATORY_CARE_PROVIDER_SITE_OTHER): Payer: Self-pay | Admitting: Plastic Surgery

## 2023-08-06 ENCOUNTER — Encounter: Payer: Self-pay | Admitting: Plastic Surgery

## 2023-08-06 DIAGNOSIS — Z719 Counseling, unspecified: Secondary | ICD-10-CM

## 2023-08-06 NOTE — Progress Notes (Signed)
 Preoperative Dx: Hyperpigmentation of face  Postoperative Dx:  same  Procedure: laser to face  Anesthesia: none  Description of Procedure:  Risks and complications were explained to the patient. Consent was confirmed and signed. Eye protection was placed. Time out was called and all information was confirmed to be correct. The area  area was prepped with alcohol and wiped dry. The heroic laser was set at 532 nm 2.5 J/cm2. The face was lasered. The patient tolerated the procedure well and there were no complications. The patient is to follow up in 4 weeks.

## 2023-08-06 NOTE — Telephone Encounter (Signed)
 Called 2x to try and sch laser trl and N/C consult with lily concerning a poss facial in the future after laser apts

## 2023-08-12 ENCOUNTER — Ambulatory Visit

## 2023-09-06 ENCOUNTER — Other Ambulatory Visit: Admitting: Plastic Surgery

## 2023-09-15 ENCOUNTER — Other Ambulatory Visit: Payer: Self-pay | Admitting: Internal Medicine

## 2023-12-27 ENCOUNTER — Encounter: Payer: Self-pay | Admitting: Internal Medicine

## 2023-12-27 ENCOUNTER — Ambulatory Visit: Payer: Medicare Other | Admitting: Internal Medicine

## 2023-12-27 VITALS — BP 118/60 | HR 65 | Ht 63.0 in | Wt 158.4 lb

## 2023-12-27 DIAGNOSIS — E890 Postprocedural hypothyroidism: Secondary | ICD-10-CM

## 2023-12-27 DIAGNOSIS — R221 Localized swelling, mass and lump, neck: Secondary | ICD-10-CM | POA: Diagnosis not present

## 2023-12-27 NOTE — Patient Instructions (Signed)
Please stop at the lab.  Continue levothyroxine 112 mcg daily.  Take the thyroid hormone every day, with water, at least 30 minutes before breakfast, separated by at least 4 hours from: - acid reflux medications - calcium - iron - multivitamins  Please return in 1 year.

## 2023-12-27 NOTE — Progress Notes (Addendum)
 Patient ID: Kristen [name], female   DOB: [birth year], 63 y.o.   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 Last visit 1 year ago.  Interim history: At last visit, she had insomnia, palpitations -chronic, usually when lying down, felt jittery.  These have resolved and at today's visit, she is feeling well, without complaints.  Pt is on LT4 112 mcg daily (increased 12/2022), taken: - in am (5:30 AM) - fasting - at least 30 min from b'fast - no calcium - + iron 2/2 fatigue later in the day - + multivitamins at lunchtime - no PPIs - off Biotin-prev. Hair skin and nails vitamins (stopped ). Off B complex.  Reviewed her TFTs: Lab Results  Component Value Date   TSH 4.54 02/08/2023   TSH 12.70 (H) 12/28/2022   TSH 1.49 10/24/2021   TSH 0.15 (L) 08/04/2021   TSH 0.67 04/11/2021   TSH 6.77 (H) 03/07/2021   FREET4 0.78 02/08/2023   FREET4 0.86 12/28/2022   FREET4 1.11 10/24/2021   FREET4 0.92 08/04/2021   FREET4 1.07 04/11/2021   FREET4 0.75 03/07/2021  09/20/2020 (OB/GYN): TSH 1.39 10/21/2014: TSH 8.210 09/01/2014: TSH 1.590 07/26/2014: TSH 0.440 06/28/2014: TSH 0.110 06/09/2014: TSH 0.150 06/09/2014: TSH 0.300 (0.34-4.8) 04/26/2014: TSH 1.490 12/11/2013: TSH 0.290 (0.34-4.8)  Pt denies: - feeling nodules in neck - hoarseness - choking - dysphagia (in the past it resolved after gargling with salt water) - not currently  No FH of thyroid  disease or cancer. No h/o radiation tx to head or neck other than RAI treatment for Graves' disease.  She continues to have anxiety/PTSD.  In 2021 she had steroid injections in her shoulder for rotator cuff.   She has a history of neuropathy >> leg weakness >> falls >> saw Dr. Darlyn Sharps >> had PT.  She has h/o low B vitamins >> on replacement. She has h/o repeated UTIs and yeast infections >> sees urology. She has  a h/o thrush and rash on abdomen, forearms and legs >> improved. Also has h/o BPPV. She had a BSO in 2023. Since the surgery, she had more hair loss.  She has longstanding neuropathy.    ROS: + See HPI  I reviewed pt's medications, allergies, PMH, social hx, family hx, and changes were documented in the history of present illness. Otherwise, unchanged from my initial visit note.  PMH: - h/o Graves' disease - Post ablative hypothyroidism - Anxiety /depression - Postmenopausal status   PMH: -No surgical history  Social History   Social History   Marital Status: Married    Spouse Name: N/A   Number of Children: 1   Occupational History   N/a     Smoking status: Never Smoker    Smokeless tobacco: No   Alcohol Use: No   Drug Use: No   Current Outpatient Medications  Medication Sig Dispense Refill   Cyanocobalamin (B-12 COMPLIANCE INJECTION IJ) Inject as directed once a week. Wednesday's     Dermatological Products, Misc. [facility] EX) Apply topically 2 (two) times daily.     fexofenadine (ALLEGRA) 60 MG tablet Take 60 mg by mouth 2 (two) times daily as needed for allergies or rhinitis.     ibuprofen  (ADVIL ) 800 MG tablet Take 1 tablet (800 mg total) by mouth every 8 (eight) hours as needed. 30 tablet 0   levothyroxine  (SYNTHROID ) 112 MCG tablet TAKE 1 TABLET BY MOUTH  EVERY DAY 90 tablet 1   metoprolol tartrate (LOPRESSOR) 50 MG tablet Take 50 mg by mouth daily.     oxyCODONE  (OXY IR/ROXICODONE ) 5 MG immediate release tablet Take 1 tablet (5 mg total) by mouth every 6 (six) hours as needed for severe pain. 15 tablet 0   sertraline (ZOLOFT) 50 MG tablet Take 50 mg by mouth daily.     thiamine (VITAMIN B-1) 100 MG tablet Take 100 mg by mouth 2 (two) times daily.     Wheat Dextrin (BENEFIBER) CHEW Chew by mouth daily.     zolpidem (AMBIEN) 10 MG tablet Take 10 mg by mouth at bedtime.     No current facility-administered medications for this visit.   Allergies  Allergen  Reactions   Benadryl [Diphenhydramine Hcl] Other (See Comments)    Makes her skin crawl and she gets hyped up    Duloxetine Hcl Other (See Comments)   Omeprazole Rash   Family history: - No thyroid  family history  - No diabetes, hypertension, hyperlipidemia, cancer in family members   PE: BP 118/60   Pulse 65   Ht 5' 3 (1.6 m)   Wt 158 lb 6.4 oz (71.8 kg)   LMP 08/16/2010   SpO2 96%   BMI 28.06 kg/m   Wt Readings from Last 3 Encounters:  12/27/23 158 lb 6.4 oz (71.8 kg)  12/28/22 150 lb 6.4 oz (68.2 kg)  06/22/22 148 lb 9.6 oz (67.4 kg)   Constitutional: normal weight, in NAD Eyes:  EOMI, no exophthalmos ENT: no neck masses, no cervical lymphadenopathy Cardiovascular: RRR, No MRG Respiratory: CTA B Musculoskeletal: no deformities Skin:+ Petechiae and desquamative rash on tips of fingers and toes Neurological: no tremor with outstretched hands  ASSESSMENT: 1.  Post ablative Hypothyroidism  2.  Neck swelling  PLAN:  1. Patient with longstanding hypothyroidism, developed after RAI treatment for Graves' disease, with previously uncontrolled TFTs over the years.  Her TFTs improved after switching to brand-name Synthroid  but afterwards we had to switch back to generic levothyroxine  due to price.  At last visit, she returned after 1.5 years from the previous visit.  Her TSH was high, at 12.7 so we increased the dose of her levothyroxine  back to 112 mcg daily (she was taking 100 mcg daily rather than the recommended 112 mcg daily). - latest thyroid  labs reviewed with pt. >> normal: Lab Results  Component Value Date   TSH 4.54 02/08/2023  - she continues on LT4 112 mcg daily - pt feels good on this dose.  At last visit she had an 11 pound weight loss and she had insomnia and felt jittery.  At today's visit, she feels well, without complaints.  She has a petechial, desquamative rash on tips of fingers and toes.  We discussed about trying to apply a vitamin E cream. - we discussed  about taking the thyroid  hormone every day, with water, >30 minutes before breakfast, separated by >4 hours from acid reflux medications, calcium, iron, multivitamins. Pt. is taking it correctly. - will check thyroid  tests today: TSH and fT4 - If labs are abnormal, she will need to return for repeat TFTs in 1.5 months - OTW, RTC in 1 year  2.  Neck swelling -She previously was found to have neck swelling without neck compression symptoms except for mild dysphagia, which is chronic (possibly associated with dry mouth).  I did not feel thyromegaly or thyroid  nodules on physical exam.  She had mild lymphadenopathy bilaterally on the lateral cervical  regions - Previous thyroid  uptake was homogeneous - No neck compression symptoms or masses felt on palpation of her neck today - No further imaging investigation is needed-will continue to follow her clinically  Needs refills.  Component     Latest Ref Rng 12/27/2023  TSH     0.40 - 4.50 mIU/L 2.81   T4,Free(Direct)     0.8 - 1.8 ng/dL 1.4   Normal labs.  Lela Fendt, MD PhD [facility] Endocrinology

## 2023-12-28 LAB — T4, FREE: Free T4: 1.4 ng/dL (ref 0.8–1.8)

## 2023-12-28 LAB — TSH: TSH: 2.81 m[IU]/L (ref 0.40–4.50)

## 2023-12-30 ENCOUNTER — Ambulatory Visit: Payer: Self-pay | Admitting: Internal Medicine

## 2023-12-30 MED ORDER — LEVOTHYROXINE SODIUM 112 MCG PO TABS: 112.0000 ug | ORAL_TABLET | Freq: Every day | ORAL | 3 refills | Status: AC

## 2023-12-30 NOTE — Addendum Note (Signed)
 Addended by: TRIXIE FILE on: 12/30/2023 12:12 PM   Modules accepted: Orders

## 2024-12-28 ENCOUNTER — Ambulatory Visit: Admitting: Internal Medicine
